# Patient Record
Sex: Male | Born: 1960 | Hispanic: No | Marital: Married | Smoking: Never smoker
Health system: Southern US, Community
[De-identification: ages and names within clinical notes are randomized; demographics above are authoritative.]

## PROBLEM LIST (undated history)

## (undated) DIAGNOSIS — D497 Neoplasm of unspecified behavior of endocrine glands and other parts of nervous system: Secondary | ICD-10-CM

## (undated) DIAGNOSIS — F32A Depression, unspecified: Secondary | ICD-10-CM

## (undated) DIAGNOSIS — R51 Headache: Secondary | ICD-10-CM

## (undated) DIAGNOSIS — E785 Hyperlipidemia, unspecified: Secondary | ICD-10-CM

## (undated) DIAGNOSIS — H539 Unspecified visual disturbance: Secondary | ICD-10-CM

## (undated) DIAGNOSIS — E119 Type 2 diabetes mellitus without complications: Secondary | ICD-10-CM

## (undated) DIAGNOSIS — F329 Major depressive disorder, single episode, unspecified: Secondary | ICD-10-CM

## (undated) DIAGNOSIS — H269 Unspecified cataract: Secondary | ICD-10-CM

## (undated) DIAGNOSIS — R413 Other amnesia: Secondary | ICD-10-CM

## (undated) DIAGNOSIS — M1990 Unspecified osteoarthritis, unspecified site: Secondary | ICD-10-CM

## (undated) DIAGNOSIS — R519 Headache, unspecified: Secondary | ICD-10-CM

## (undated) HISTORY — DX: Other amnesia: R41.3

## (undated) HISTORY — DX: Headache, unspecified: R51.9

## (undated) HISTORY — DX: Unspecified cataract: H26.9

## (undated) HISTORY — DX: Depression, unspecified: F32.A

## (undated) HISTORY — PX: OTHER SURGICAL HISTORY: SHX169

## (undated) HISTORY — DX: Type 2 diabetes mellitus without complications: E11.9

## (undated) HISTORY — PX: SHOULDER ARTHROSCOPY: SHX128

## (undated) HISTORY — DX: Headache: R51

## (undated) HISTORY — DX: Major depressive disorder, single episode, unspecified: F32.9

---

## 2013-08-18 ENCOUNTER — Encounter: Payer: Self-pay | Admitting: Internal Medicine

## 2013-08-18 ENCOUNTER — Ambulatory Visit: Payer: Medicaid Other | Attending: Internal Medicine | Admitting: Internal Medicine

## 2013-08-18 VITALS — BP 132/89 | HR 85 | Temp 98.3°F | Resp 16 | Ht 68.0 in | Wt 224.0 lb

## 2013-08-18 DIAGNOSIS — M545 Low back pain, unspecified: Secondary | ICD-10-CM | POA: Insufficient documentation

## 2013-08-18 DIAGNOSIS — M25569 Pain in unspecified knee: Secondary | ICD-10-CM

## 2013-08-18 DIAGNOSIS — M25561 Pain in right knee: Secondary | ICD-10-CM

## 2013-08-18 LAB — CBC WITH DIFFERENTIAL/PLATELET
Basophils Absolute: 0.1 10*3/uL (ref 0.0–0.1)
Basophils Relative: 1 % (ref 0–1)
Eosinophils Absolute: 0.3 10*3/uL (ref 0.0–0.7)
Hemoglobin: 13.7 g/dL (ref 13.0–17.0)
MCH: 26.7 pg (ref 26.0–34.0)
MCHC: 33.1 g/dL (ref 30.0–36.0)
Monocytes Relative: 5 % (ref 3–12)
Neutro Abs: 4.4 10*3/uL (ref 1.7–7.7)
Neutrophils Relative %: 58 % (ref 43–77)
Platelets: 290 10*3/uL (ref 150–400)
RDW: 13.2 % (ref 11.5–15.5)
WBC: 7.6 10*3/uL (ref 4.0–10.5)

## 2013-08-18 LAB — CMP AND LIVER
ALT: 13 U/L (ref 0–53)
Albumin: 4.6 g/dL (ref 3.5–5.2)
BUN: 15 mg/dL (ref 6–23)
Bilirubin, Direct: 0.1 mg/dL (ref 0.0–0.3)
CO2: 28 mEq/L (ref 19–32)
Calcium: 9.8 mg/dL (ref 8.4–10.5)
Glucose, Bld: 175 mg/dL — ABNORMAL HIGH (ref 70–99)
Indirect Bilirubin: 0.2 mg/dL (ref 0.0–0.9)
Potassium: 4.3 mEq/L (ref 3.5–5.3)
Sodium: 135 mEq/L (ref 135–145)
Total Bilirubin: 0.3 mg/dL (ref 0.3–1.2)
Total Protein: 8 g/dL (ref 6.0–8.3)

## 2013-08-18 LAB — LIPID PANEL
Cholesterol: 263 mg/dL — ABNORMAL HIGH (ref 0–200)
Triglycerides: 607 mg/dL — ABNORMAL HIGH (ref <150)

## 2013-08-18 MED ORDER — NAPROXEN 500 MG PO TABS: 500.0000 mg | ORAL_TABLET | Freq: Two times a day (BID) | ORAL | Status: DC

## 2013-08-18 MED ORDER — DICLOFENAC SODIUM 1 % TD GEL: 2.0000 g | Freq: Four times a day (QID) | TRANSDERMAL | Status: DC

## 2013-08-18 NOTE — Progress Notes (Signed)
Pt is here today to establish care. For a year and a half he has been dealing with extreme pain in his right knee and lower back. Pt has a Nurse, learning disability with him today.

## 2013-08-18 NOTE — Progress Notes (Signed)
Patient ID: Joe Wallace, male   DOB: 30-Jan-1961, 52 y.o.   MRN: [id number] Patient Demographics  Joe Wallace, is a 52 y.o. male  [id number]  [id number]  DOB - 11-29-1960  CC:  Chief Complaint  Patient presents with  . Establish Care       HPI: Joe Wallace is a 52 y.o. male here today to establish medical care. Patient has no known past medical history, recently relocated to the [place] about 40 days ago. Major complaint today is right knee pain and low back pain that has been going on for about a year. He does not remember any injury, no swelling, no tingling or numbness in any of the limbs. He does not remember any stiffness. No redness. Never been diagnosed with arthritis. He does not smoke cigarette, he does not drink alcohol. He's not on any medication. Right knee pain is worse on walking or standing from a sitting position. Patient has No headache, No chest pain, No abdominal pain - No Nausea, No new weakness tingling or numbness, No Cough - SOB.  No Known Allergies History reviewed. No pertinent past medical history. No current outpatient prescriptions on file prior to visit.   No current facility-administered medications on file prior to visit.   Family History  Problem Relation Age of Onset  . Diabetes Mother   . Hypertension Mother   . Hypertension Father    History   Social History  . Marital Status: Married    Spouse Name: N/A    Number of Children: N/A  . Years of Education: N/A   Occupational History  . Not on file.   Social History Main Topics  . Smoking status: Never Smoker   . Smokeless tobacco: Not on file  . Alcohol Use: No  . Drug Use: No  . Sexual Activity: Not on file   Other Topics Concern  . Not on file   Social History Narrative  . No narrative on file    Review of Systems: Constitutional: Negative for fever, chills, diaphoresis, activity change, appetite change and fatigue. HENT: Negative for ear pain, nosebleeds,  congestion, facial swelling, rhinorrhea, neck pain, neck stiffness and ear discharge.  Eyes: Negative for pain, discharge, redness, itching and visual disturbance. Respiratory: Negative for cough, choking, chest tightness, shortness of breath, wheezing and stridor.  Cardiovascular: Negative for chest pain, palpitations and leg swelling. Gastrointestinal: Negative for abdominal distention. Genitourinary: Negative for dysuria, urgency, frequency, hematuria, flank pain, decreased urine volume, difficulty urinating and dyspareunia.  Musculoskeletal: Negative for back pain, joint swelling, arthralgia and gait problem. Neurological: Negative for dizziness, tremors, seizures, syncope, facial asymmetry, speech difficulty, weakness, light-headedness, numbness and headaches.  Hematological: Negative for adenopathy. Does not bruise/bleed easily. Psychiatric/Behavioral: Negative for hallucinations, behavioral problems, confusion, dysphoric mood, decreased concentration and agitation.    Objective:   Filed Vitals:   08/18/13 1406  BP: 132/89  Pulse: 85  Temp: 98.3 F (36.8 C)  Resp: 16    Physical Exam: Constitutional: Patient appears well-developed and well-nourished. No distress. Obese HENT: Normocephalic, atraumatic, External right and left ear normal. Oropharynx is clear and moist.  Eyes: Conjunctivae and EOM are normal. PERRLA, no scleral icterus. Neck: Normal ROM. Neck supple. No JVD. No tracheal deviation. No thyromegaly. CVS: RRR, S1/S2 +, no murmurs, no gallops, no carotid bruit.  Pulmonary: Effort and breath sounds normal, no stridor, rhonchi, wheezes, rales.  Abdominal: Soft. BS +, no distension, tenderness, rebound or guarding.  Musculoskeletal: Normal range of motion of left  knee, restricted flexion and extension of the right knee due to pain. No edema and no tenderness.  Lymphadenopathy: No lymphadenopathy noted, cervical, inguinal or axillary Neuro: Alert. Normal reflexes, muscle  tone coordination. No cranial nerve deficit. Skin: Skin is warm and dry. No rash noted. Not diaphoretic. No erythema. No pallor. Psychiatric: Normal mood and affect. Behavior, judgment, thought content normal.  No results found for this basename: WBC, HGB, HCT, MCV, PLT   No results found for this basename: CREATININE, BUN, NA, K, CL, CO2    No results found for this basename: HGBA1C   Lipid Panel  No results found for this basename: chol, trig, hdl, cholhdl, vldl, ldlcalc       Assessment and plan:   1. Right knee pain - naproxen (NAPROSYN) 500 MG tablet; Take 1 tablet (500 mg total) by mouth 2 (two) times daily with a meal.  Dispense: 30 tablet; Refill: 1 - diclofenac sodium (VOLTAREN) 1 % GEL; Apply 2 g topically 4 (four) times daily.  Dispense: 2 Tube; Refill: 1 - DG Knee 2 Views Right; Future  2. Low back pain - naproxen (NAPROSYN) 500 MG tablet; Take 1 tablet (500 mg total) by mouth 2 (two) times daily with a meal.  Dispense: 30 tablet; Refill: 1  3. Morbid obesity - CMP and Liver - CBC with Differential - Lipid panel - TSH  Follow up in 3 months or when necessary  Interpreter was used to communicate directly with patient for the entire encounter including providing detailed patient instructions.   The patient was given clear instructions to go to ER or return to medical center if symptoms don't improve, worsen or new problems develop. The patient verbalized understanding. The patient was told to call to get lab results if they haven't heard anything in the next week.     Jeanann Lewandowsky, MD, MHA, FACP, FAAP [facility] [place], [place] [phone]   08/18/2013, 2:27 PM

## 2013-08-28 ENCOUNTER — Telehealth: Payer: Self-pay | Admitting: Emergency Medicine

## 2013-08-28 MED ORDER — GEMFIBROZIL 600 MG PO TABS: 600.0000 mg | ORAL_TABLET | Freq: Two times a day (BID) | ORAL | Status: DC

## 2013-08-28 NOTE — Telephone Encounter (Signed)
Message copied by Darlis Loan on Thu Aug 28, 2013  2:04 PM ------      Message from: Jeanann Lewandowsky E      Created: Wed Aug 27, 2013  5:35 PM       Please inform patient that his blood sugar is high as well as his cholesterol triglyceride level. We'll need to get a hemoglobin A1c laboratory tests. We also needs to start him on medication for triglyceride. Recommend strict low cholesterol low fat diet, and regular exercise 3 times a week 30 minutes each time.       Please call in the following medications:            Gemfibrozil 600 mg tablet by mouth twice a day, 60 tablets with 5 refills ------

## 2013-08-28 NOTE — Telephone Encounter (Signed)
Left message for pt to call for results  

## 2013-09-01 ENCOUNTER — Ambulatory Visit (HOSPITAL_COMMUNITY)
Admission: RE | Admit: 2013-09-01 | Discharge: 2013-09-01 | Disposition: A | Discharge status: Home / Self Care | Payer: Medicaid Other | Source: Ambulatory Visit | Attending: Internal Medicine | Admitting: Internal Medicine

## 2013-09-01 DIAGNOSIS — M1710 Unilateral primary osteoarthritis, unspecified knee: Secondary | ICD-10-CM | POA: Insufficient documentation

## 2013-09-01 DIAGNOSIS — IMO0002 Reserved for concepts with insufficient information to code with codable children: Secondary | ICD-10-CM | POA: Insufficient documentation

## 2013-09-01 DIAGNOSIS — M25561 Pain in right knee: Secondary | ICD-10-CM

## 2013-09-02 ENCOUNTER — Ambulatory Visit: Payer: Medicaid Other | Admitting: Internal Medicine

## 2013-09-05 ENCOUNTER — Telehealth: Payer: Self-pay | Admitting: *Deleted

## 2013-09-05 NOTE — Telephone Encounter (Signed)
Message copied by Raynelle Chary on Fri Sep 05, 2013  2:06 PM ------      Message from: Jeanann Lewandowsky E      Created: Thu Sep 04, 2013  3:54 PM       Please inform patient that his knee x-ray is negative for any disease ------

## 2013-09-05 NOTE — Telephone Encounter (Signed)
Message copied by Raynelle Chary on Fri Sep 05, 2013  2:08 PM ------      Message from: Jeanann Lewandowsky E      Created: Thu Sep 04, 2013  3:54 PM       Please inform patient that his knee x-ray is negative for any disease ------

## 2013-09-15 ENCOUNTER — Encounter: Payer: Self-pay | Admitting: Internal Medicine

## 2013-09-15 ENCOUNTER — Ambulatory Visit: Payer: Medicaid Other | Attending: Internal Medicine | Admitting: Internal Medicine

## 2013-09-15 DIAGNOSIS — M25561 Pain in right knee: Secondary | ICD-10-CM

## 2013-09-15 DIAGNOSIS — M25569 Pain in unspecified knee: Secondary | ICD-10-CM

## 2013-09-15 MED ORDER — IBUPROFEN 600 MG PO TABS: 600.0000 mg | ORAL_TABLET | Freq: Three times a day (TID) | ORAL | Status: DC | PRN

## 2013-09-15 NOTE — Progress Notes (Signed)
MRN: [id number] Name: Joe Wallace  Sex: male Age: 52 y.o. DOB: 18-Jan-1961  Allergies: Review of patient's allergies indicates no known allergies.  Chief Complaint  Patient presents with  . Follow-up    HPI: Patient is 52 y.o. male who comes today for followup history of chronic right knee pain for the last 3 years patient was prescribed naproxen and voltaren gel patient still has pain, had an x-ray done which was negative for any dislocation or fracture.patient denies any fever chills.  History reviewed. No pertinent past medical history.  History reviewed. No pertinent past surgical history.    Medication List       This list is accurate as of: 09/15/13  4:21 PM.  Always use your most recent med list.               diclofenac sodium 1 % Gel  Commonly known as:  VOLTAREN  Apply 2 g topically 4 (four) times daily.     gemfibrozil 600 MG tablet  Commonly known as:  LOPID  Take 1 tablet (600 mg total) by mouth 2 (two) times daily before a meal.     ibuprofen 600 MG tablet  Commonly known as:  ADVIL,MOTRIN  Take 1 tablet (600 mg total) by mouth every 8 (eight) hours as needed.        Meds ordered this encounter  Medications  . ibuprofen (ADVIL,MOTRIN) 600 MG tablet    Sig: Take 1 tablet (600 mg total) by mouth every 8 (eight) hours as needed.    Dispense:  90 tablet    Refill:  1     There is no immunization history on file for this patient.  Family History  Problem Relation Age of Onset  . Diabetes Mother   . Hypertension Mother   . Hypertension Father     History  Substance Use Topics  . Smoking status: Never Smoker   . Smokeless tobacco: Not on file  . Alcohol Use: No    Review of Systems  As noted in HPI  There were no vitals filed for this visit.  Physical Exam  Physical Exam  Eyes: EOM are normal. Pupils are equal, round, and reactive to light.  Cardiovascular: Normal rate and regular rhythm.   Pulmonary/Chest: Breath sounds  normal. No respiratory distress. He has no wheezes. He has no rales.  Musculoskeletal: He exhibits no edema.  Right knee tenderness posteriorly no erythema, good ROM    CBC    Component Value Date/Time   WBC 7.6 08/18/2013 1427   RBC 5.14 08/18/2013 1427   HGB 13.7 08/18/2013 1427   HCT 41.4 08/18/2013 1427   PLT 290 08/18/2013 1427   MCV 80.5 08/18/2013 1427   LYMPHSABS 2.4 08/18/2013 1427   MONOABS 0.4 08/18/2013 1427   EOSABS 0.3 08/18/2013 1427   BASOSABS 0.1 08/18/2013 1427    CMP     Component Value Date/Time   NA 135 08/18/2013 1427   K 4.3 08/18/2013 1427   CL 97 08/18/2013 1427   CO2 28 08/18/2013 1427   GLUCOSE 175* 08/18/2013 1427   BUN 15 08/18/2013 1427   CREATININE 1.01 08/18/2013 1427   CALCIUM 9.8 08/18/2013 1427   PROT 8.0 08/18/2013 1427   ALBUMIN 4.6 08/18/2013 1427   AST 15 08/18/2013 1427   ALT 13 08/18/2013 1427   ALKPHOS 74 08/18/2013 1427   BILITOT 0.3 08/18/2013 1427    Lab Results  Component Value Date/Time   CHOL 263* 08/18/2013  2:27  PM    No components found with this basename: hga1c    Lab Results  Component Value Date/Time   AST 15 08/18/2013  2:27 PM    Assessment and Plan  Knee pain, right - Plan: I have discontinued naproxen we'll try ibuprofen (ADVIL,MOTRIN) 600 MG tablet, Ambulatory referral to Orthopedic Surgery   Return in about 3 months (around 12/14/2013).  Doris Cheadle, MD

## 2013-09-15 NOTE — Progress Notes (Signed)
Patient here for follow up Pain to right knee and back

## 2013-10-08 ENCOUNTER — Ambulatory Visit: Payer: Medicaid Other | Admitting: Sports Medicine

## 2013-11-18 ENCOUNTER — Encounter: Payer: Self-pay | Admitting: Internal Medicine

## 2013-11-18 ENCOUNTER — Ambulatory Visit: Payer: Medicaid Other | Attending: Internal Medicine | Admitting: Internal Medicine

## 2013-11-18 VITALS — BP 123/78 | HR 79

## 2013-11-18 DIAGNOSIS — R262 Difficulty in walking, not elsewhere classified: Secondary | ICD-10-CM | POA: Insufficient documentation

## 2013-11-18 DIAGNOSIS — M25561 Pain in right knee: Secondary | ICD-10-CM | POA: Insufficient documentation

## 2013-11-18 DIAGNOSIS — G8929 Other chronic pain: Secondary | ICD-10-CM | POA: Insufficient documentation

## 2013-11-18 DIAGNOSIS — Z09 Encounter for follow-up examination after completed treatment for conditions other than malignant neoplasm: Secondary | ICD-10-CM | POA: Insufficient documentation

## 2013-11-18 DIAGNOSIS — G4733 Obstructive sleep apnea (adult) (pediatric): Secondary | ICD-10-CM | POA: Insufficient documentation

## 2013-11-18 DIAGNOSIS — M25569 Pain in unspecified knee: Secondary | ICD-10-CM | POA: Insufficient documentation

## 2013-11-18 MED ORDER — ACETAMINOPHEN-CODEINE #3 300-30 MG PO TABS: 1.0000 | ORAL_TABLET | ORAL | Status: DC | PRN

## 2013-11-18 NOTE — Progress Notes (Signed)
Patient ID: Joe Wallace, male   DOB: 1961-02-25, 53 y.o.   MRN: [id number]   Tim Corriher, is a 53 y.o. male  [id number]  KGM:[id number]  DOB - 20-Feb-1961  No chief complaint on file.       Subjective:   Joe Wallace is a 53 y.o. male here today for a follow up visit. Patient is here to complain about right knee pain which is chronic, patient reports pain is getting worse and limiting his ability to walk. There is no swelling. Patient cannot remember any injury or fall. Patient has used naproxen with no relief, as well as ibuprofen and tramadol, which weren't particularly useful. No leg edema. Denies chest pain. Patient has No headache, No chest pain, No abdominal pain - No Nausea, No new weakness tingling or numbness, No Cough - SOB.  Problem  Knee Pain, Right    ALLERGIES: No Known Allergies  PAST MEDICAL HISTORY: No past medical history on file.  MEDICATIONS AT HOME: Prior to Admission medications   Medication Sig Start Date End Date Taking? Authorizing Provider  acetaminophen-codeine (TYLENOL #3) 300-30 MG per tablet Take 1 tablet by mouth every 4 (four) hours as needed. 11/18/13   Angelica Chessman, MD  diclofenac sodium (VOLTAREN) 1 % GEL Apply 2 g topically 4 (four) times daily. 08/18/13   Angelica Chessman, MD  gemfibrozil (LOPID) 600 MG tablet Take 1 tablet (600 mg total) by mouth 2 (two) times daily before a meal. 08/28/13   Angelica Chessman, MD  ibuprofen (ADVIL,MOTRIN) 600 MG tablet Take 1 tablet (600 mg total) by mouth every 8 (eight) hours as needed. 09/15/13   Lorayne Marek, MD     Objective:   Filed Vitals:   11/18/13 1530  BP: 123/78  Pulse: 79    Exam General appearance : Awake, alert, not in any distress. Speech Clear. Not toxic looking HEENT: Atraumatic and Normocephalic, pupils equally reactive to light and accomodation Neck: supple, no JVD. No cervical lymphadenopathy.  Chest:Good air entry bilaterally, no added sounds  CVS: S1 S2  regular, no murmurs.  Abdomen: Bowel sounds present, Non tender and not distended with no gaurding, rigidity or rebound. Extremities: No effusion the knee, range of motion limited because of pain. B/L Lower Ext shows no edema, both legs are warm to touch Neurology: Awake alert, and oriented X 3, CN II-XII intact, Non focal Skin:No Rash Wounds:N/A  Data Review  Assessment & Plan   1. Knee pain, right  - MR Knee Right Wo Contrast; Future - acetaminophen-codeine (TYLENOL #3) 300-30 MG per tablet; Take 1 tablet by mouth every 4 (four) hours as needed.  Dispense: 60 tablet; Refill: 0 - Ambulatory referral to Orthopedic Surgery  2. Obstructive sleep apnea Nocturnal polysomnography  Patient extensively counseled on nutrition and exercise  Interpreter was used to communicate directly with patient for the entire encounter including providing detailed patient instructions.   Return in about 3 months (around 02/15/2014), or if symptoms worsen or fail to improve, for Annual Physical.  The patient was given clear instructions to go to ER or return to medical center if symptoms don't improve, worsen or new problems develop. The patient verbalized understanding. The patient was told to call to get lab results if they haven't heard anything in the next week.    Angelica Chessman, MD, [place], [place], [place] and [place], [place]   11/18/2013, 3:36 PM

## 2013-12-03 ENCOUNTER — Ambulatory Visit: Payer: Medicaid Other | Admitting: Sports Medicine

## 2013-12-04 ENCOUNTER — Ambulatory Visit (HOSPITAL_COMMUNITY)
Admission: RE | Admit: 2013-12-04 | Discharge: 2013-12-04 | Disposition: A | Discharge status: Home / Self Care | Payer: Medicaid Other | Source: Ambulatory Visit | Attending: Internal Medicine | Admitting: Internal Medicine

## 2013-12-04 ENCOUNTER — Other Ambulatory Visit: Payer: Self-pay | Admitting: Emergency Medicine

## 2013-12-04 ENCOUNTER — Telehealth: Payer: Self-pay | Admitting: Internal Medicine

## 2013-12-04 DIAGNOSIS — M25561 Pain in right knee: Secondary | ICD-10-CM

## 2013-12-04 DIAGNOSIS — S0095XA Superficial foreign body of unspecified part of head, initial encounter: Secondary | ICD-10-CM

## 2013-12-04 NOTE — Telephone Encounter (Signed)
Radiology stated they could not do the MRI without a CT scan first because the patient has a piece of metal in his head. Please call Tasha in radiology for details of what order is needed/questions. Her number is [phone]

## 2013-12-04 NOTE — Telephone Encounter (Signed)
Spoke with pt via pacific interpretor line and informed he needs to get Ct scan tomorrow r/o foreign object @ 945 am. We will call him tomorrow to schedule MRI

## 2013-12-05 ENCOUNTER — Encounter: Payer: Self-pay | Admitting: *Deleted

## 2013-12-05 ENCOUNTER — Ambulatory Visit (HOSPITAL_COMMUNITY)
Admission: RE | Admit: 2013-12-05 | Discharge: 2013-12-05 | Disposition: A | Discharge status: Home / Self Care | Payer: Medicaid Other | Source: Ambulatory Visit | Attending: Internal Medicine | Admitting: Internal Medicine

## 2013-12-05 DIAGNOSIS — Z0000 Encounter for general adult medical examination without abnormal findings: Secondary | ICD-10-CM

## 2013-12-05 DIAGNOSIS — S0095XA Superficial foreign body of unspecified part of head, initial encounter: Secondary | ICD-10-CM

## 2013-12-05 DIAGNOSIS — Z135 Encounter for screening for eye and ear disorders: Secondary | ICD-10-CM | POA: Insufficient documentation

## 2013-12-15 ENCOUNTER — Ambulatory Visit: Payer: Medicaid Other | Admitting: Internal Medicine

## 2013-12-17 ENCOUNTER — Ambulatory Visit (INDEPENDENT_AMBULATORY_CARE_PROVIDER_SITE_OTHER): Payer: Medicaid Other | Admitting: Sports Medicine

## 2013-12-17 ENCOUNTER — Encounter: Payer: Self-pay | Admitting: Sports Medicine

## 2013-12-17 VITALS — Ht 68.0 in | Wt 224.0 lb

## 2013-12-17 DIAGNOSIS — M25561 Pain in right knee: Secondary | ICD-10-CM

## 2013-12-17 DIAGNOSIS — M25569 Pain in unspecified knee: Secondary | ICD-10-CM

## 2013-12-17 MED ORDER — METHYLPREDNISOLONE ACETATE 40 MG/ML IJ SUSP
40.0000 mg | Freq: Once | INTRAMUSCULAR | Status: AC
  Administered 2013-12-17: 40 mg via INTRA_ARTICULAR

## 2013-12-17 NOTE — Progress Notes (Addendum)
   Subjective:    Patient ID: Joe Wallace, male    DOB: Jan 28, 1961, 53 y.o.   MRN: [id number]  HPI  History and exam performed with Interpreter present.  Joe Wallace is from [place] and presents with a 2 year history of right knee pain. He cannot recall a specific injury to the knee but states the pain is getting worse. While in [place] he was told he had a meniscal injury but has never been operated on. Previously he was given an injection in his buttock to help with the knee pain but is unsure what medicine he received and states it did not improve his symptoms.   Pain is in the popliteal fossa and radiates to the medial joint line. It is worse with walking and going upstairs and twisting motion. He has been taking Ibuprofen 600 MG and Tylenol #3 without help. He states that voltaren gel improves his symptoms. Denies swelling, locking, buckling, or nighttime pain.   Review of Systems Negative apart from HPI     Objective:   Physical Exam   Right Knee Inspection: no effusion or erythema.  Palpation: tender along right medial joint line. Also tender in popliteal fossa ROM: Pain with flexion. Flexion to 110 degrees, Extension to 0 degrees.   Special Test: Crepitus with extension, pain with McMurray's testing. Negative varus, valgus, and anterior drawer. Neurovascularly intact distally.   X-ray Right knee 07/2013 (non-standing): There is no evidence of fracture, dislocation, or joint effusion. There is no evidence of arthropathy or other focal bone abnormality. Soft tissues are unremarkable.     Assessment & Plan:    1. Right knee pain: Suspect meniscal injury given pain with twisting motion and history of previous diagnosis per patient.  --Right medial injection of 40 MG Methylprednisolone --Pt can contact PCP for refill of voltaren gel as needed --Follow-up in 4 weeks. If pain is not improved will likely get an MRI at that time to assess need for surgery.   Consent obtained and  verified. Time-out conducted. Noted no overlying erythema, induration, or other signs of local infection. Skin prepped in a sterile fashion. Topical analgesic spray: Ethyl chloride. Joint: right knee Needle: 25g 1.5 inch Completed without difficulty. Meds: 3cc 1% xylocaine, 1cc (40mg ) depomedrol  Advised to call if fevers/chills, erythema, induration, drainage, or persistent bleeding.   Seen with Jacqulyn Liner, MS4

## 2013-12-18 ENCOUNTER — Telehealth: Payer: Self-pay | Admitting: Internal Medicine

## 2013-12-18 NOTE — Telephone Encounter (Signed)
Congregational nurse calling because pt's blood sugar reading of 325, and pt had eaten 4 hours prior.   Pt experience abd pain, probably due to Motrin prescribed for dental pain.

## 2013-12-19 ENCOUNTER — Other Ambulatory Visit: Payer: Medicaid Other

## 2013-12-23 ENCOUNTER — Ambulatory Visit: Payer: Medicaid Other | Attending: Internal Medicine

## 2013-12-23 MED ORDER — METFORMIN HCL 500 MG PO TABS: 500.0000 mg | ORAL_TABLET | Freq: Two times a day (BID) | ORAL | Status: DC

## 2013-12-23 NOTE — Progress Notes (Unsigned)
Pt sent here by congregational nurse for elevated CBG CBG- 165,A1c 9.3 Pt scheduled apt for new diabetes teaching with Loma Sousa 12/30/13 and referral placed nutrition  Pt started on Metformin 500 mg BID [place] interpretor used for arabic language Script sent to [facility]

## 2013-12-23 NOTE — Patient Instructions (Addendum)
Pt instructed to return to the clinic 12/30/13 for new diabetes teaching and cbg monitoring

## 2013-12-29 ENCOUNTER — Ambulatory Visit (HOSPITAL_BASED_OUTPATIENT_CLINIC_OR_DEPARTMENT_OTHER): Payer: Medicaid Other | Attending: Internal Medicine | Admitting: Radiology

## 2013-12-29 VITALS — Ht 68.0 in | Wt 210.0 lb

## 2013-12-29 DIAGNOSIS — G4733 Obstructive sleep apnea (adult) (pediatric): Secondary | ICD-10-CM | POA: Insufficient documentation

## 2013-12-31 ENCOUNTER — Ambulatory Visit: Payer: Medicaid Other | Attending: Internal Medicine | Admitting: Pharmacist

## 2013-12-31 ENCOUNTER — Encounter: Payer: Self-pay | Admitting: Pharmacist

## 2013-12-31 VITALS — BP 110/66 | HR 74 | Temp 98.4°F | Ht 68.5 in | Wt 221.0 lb

## 2013-12-31 DIAGNOSIS — Z0000 Encounter for general adult medical examination without abnormal findings: Secondary | ICD-10-CM

## 2013-12-31 DIAGNOSIS — Z09 Encounter for follow-up examination after completed treatment for conditions other than malignant neoplasm: Secondary | ICD-10-CM | POA: Insufficient documentation

## 2013-12-31 DIAGNOSIS — E119 Type 2 diabetes mellitus without complications: Secondary | ICD-10-CM | POA: Insufficient documentation

## 2013-12-31 LAB — COMPREHENSIVE METABOLIC PANEL
ALBUMIN: 4.2 g/dL (ref 3.5–5.2)
ALK PHOS: 70 U/L (ref 39–117)
ALT: 16 U/L (ref 0–53)
AST: 14 U/L (ref 0–37)
BILIRUBIN TOTAL: 0.4 mg/dL (ref 0.2–1.2)
BUN: 18 mg/dL (ref 6–23)
CO2: 29 mEq/L (ref 19–32)
Calcium: 9.5 mg/dL (ref 8.4–10.5)
Chloride: 100 mEq/L (ref 96–112)
Creat: 0.92 mg/dL (ref 0.50–1.35)
Glucose, Bld: 126 mg/dL — ABNORMAL HIGH (ref 70–99)
POTASSIUM: 4.3 meq/L (ref 3.5–5.3)
SODIUM: 138 meq/L (ref 135–145)
TOTAL PROTEIN: 7.2 g/dL (ref 6.0–8.3)

## 2013-12-31 LAB — CBC WITH DIFFERENTIAL/PLATELET
Basophils Absolute: 0 10*3/uL (ref 0.0–0.1)
Basophils Relative: 0 % (ref 0–1)
EOS PCT: 2 % (ref 0–5)
Eosinophils Absolute: 0.2 10*3/uL (ref 0.0–0.7)
HEMATOCRIT: 39.1 % (ref 39.0–52.0)
Hemoglobin: 13 g/dL (ref 13.0–17.0)
LYMPHS ABS: 2.2 10*3/uL (ref 0.7–4.0)
LYMPHS PCT: 29 % (ref 12–46)
MCH: 26.3 pg (ref 26.0–34.0)
MCHC: 33.2 g/dL (ref 30.0–36.0)
MCV: 79 fL (ref 78.0–100.0)
MONO ABS: 0.5 10*3/uL (ref 0.1–1.0)
Monocytes Relative: 6 % (ref 3–12)
Neutro Abs: 4.8 10*3/uL (ref 1.7–7.7)
Neutrophils Relative %: 63 % (ref 43–77)
Platelets: 251 10*3/uL (ref 150–400)
RBC: 4.95 MIL/uL (ref 4.22–5.81)
RDW: 13.2 % (ref 11.5–15.5)
WBC: 7.6 10*3/uL (ref 4.0–10.5)

## 2013-12-31 LAB — POCT GLYCOSYLATED HEMOGLOBIN (HGB A1C): HEMOGLOBIN A1C: 9.8

## 2013-12-31 LAB — LIPID PANEL
CHOL/HDL RATIO: 6 ratio
Cholesterol: 198 mg/dL (ref 0–200)
HDL: 33 mg/dL — AB (ref >39)
LDL CALC: 102 mg/dL — AB (ref 0–99)
Triglycerides: 313 mg/dL — ABNORMAL HIGH (ref <150)
VLDL: 63 mg/dL — AB (ref 0–40)

## 2013-12-31 LAB — GLUCOSE, POCT (MANUAL RESULT ENTRY): POC GLUCOSE: 158 mg/dL — AB (ref 70–99)

## 2013-12-31 LAB — TSH: TSH: 1.49 u[IU]/mL (ref 0.350–4.500)

## 2013-12-31 MED ORDER — GLUCOSE BLOOD VI STRP: ORAL_STRIP | Status: DC

## 2013-12-31 MED ORDER — METFORMIN HCL 1000 MG PO TABS: 1000.0000 mg | ORAL_TABLET | Freq: Two times a day (BID) | ORAL | Status: DC

## 2013-12-31 MED ORDER — ACCU-CHEK SOFTCLIX LANCET DEV MISC: Status: DC

## 2013-12-31 MED ORDER — ACCU-CHEK AVIVA DEVI: Status: AC

## 2013-12-31 NOTE — Progress Notes (Signed)
Diabetes Follow-Up Visit Chief Complaint: ....Marland KitchenMarland KitchenFenix Wallace is a 53 y.o.male presenting with newly diagnosed Type II diabetes. Chief Complaint  Patient presents with  . Diabetes   on 12/31/2013  Filed Vitals:   12/31/13 1217  BP: 110/66  Pulse: 74  Temp: 98.4 F (36.9 C)    Current Diabetes Medications: Metformin 500 mg BID   Home BG Monitoring:  Has not been checking at home since pt does not have meter.   Lab Results  Component Value Date   HGBA1C 9.8 12/31/2013    No results found for this basename: MICROALBUR, [id number]    Lab Results  Component Value Date   CHOL 263* 08/18/2013   HDL 39* 08/18/2013   LDLCALC Comment:   Not calculated due to Triglyceride >400. Suggest ordering Direct LDL (Unit Code: [phone]).   Total Cholesterol/HDL Ratio:CHD Risk                        Coronary Heart Disease Risk Table                                        Men       Women          1/2 Average Risk              3.4        3.3              Average Risk              5.0        4.4           2X Average Risk              9.6        7.1           3X Average Risk             23.4       11.0 Use the calculated Patient Ratio above and the CHD Risk table  to determine the patient's CHD Risk. ATP III Classification (LDL):       < 100        mg/dL         Optimal      100 - 129     mg/dL         Near or Above Optimal      130 - 159     mg/dL         Borderline High      160 - 189     mg/dL         High       > 190        mg/dL         Very High   08/18/2013   TRIG 607* 08/18/2013   CHOLHDL 6.7 08/18/2013      Assessment: 1.  Diabetes.  A1c not at goal 2.  Blood Pressure.  At goal (<140/90) 3.  Lipids.  Labs today 4.  Foot Care.  Checking foot daily 5.  Dental Care.  Needs dental exam 6.  Eye Care/Exam.  Needs eye exam Recommendations: 1.  Medication recommendations at this time are as follows: Increase Metformin to 1000 mg BID 2.  Reviewed HBG goals:  Fasting 80-130 and 1-2 hour post prandial  <  180.  Patient is instructed to check BG 2 times per day.   Sent rx in for meter and supplies to [facility]. 3.  BP goal < 140/90. May consider low dose ACE-I for kidney protection. 4.  LDL goal of < 100, HDL > 40 and TG < 150. 5.  Eye Exam yearly and Dental Exam every 6 months. 6.  Patient education on appropriate foot care.   7.  Return to clinic in 2 weeks.  Will review blood sugar log and adjust.      Evaluation and management procedures were performed by the Advanced Practitioner under my supervision and collaboration. I have reviewed the Advanced Practitioner's note and chart, and I agree with the management and plan.   Angelica Chessman, MD, [place], [facility]arilyn C[facility] and [facility] [place], [place]   12/31/2013, 3:49 PM

## 2014-01-01 ENCOUNTER — Telehealth: Payer: Self-pay

## 2014-01-01 ENCOUNTER — Other Ambulatory Visit: Payer: Self-pay | Admitting: Pharmacist

## 2014-01-01 DIAGNOSIS — E785 Hyperlipidemia, unspecified: Secondary | ICD-10-CM

## 2014-01-01 DIAGNOSIS — E782 Mixed hyperlipidemia: Secondary | ICD-10-CM

## 2014-01-01 MED ORDER — ATORVASTATIN CALCIUM 20 MG PO TABS
20.0000 mg | ORAL_TABLET | Freq: Every day | ORAL | Status: DC
Start: 2014-01-01 — End: 2014-12-31

## 2014-01-01 NOTE — Telephone Encounter (Signed)
Interpreter line used Patient is aware he should be taking lipitor for his cholesterol

## 2014-01-01 NOTE — Telephone Encounter (Signed)
Message copied by Dorothe Pea on Thu Jan 01, 2014  2:56 PM ------      Message from: Kerby Moors B      Created: Thu Jan 01, 2014  9:40 AM       Pt should be started on Lipitor 20 mg for cholesterol.  Will send rx to [facility].  Please provide education about cholesterol. ------

## 2014-01-03 DIAGNOSIS — G4710 Hypersomnia, unspecified: Secondary | ICD-10-CM

## 2014-01-03 DIAGNOSIS — G4730 Sleep apnea, unspecified: Secondary | ICD-10-CM

## 2014-01-03 DIAGNOSIS — G4733 Obstructive sleep apnea (adult) (pediatric): Secondary | ICD-10-CM

## 2014-01-03 NOTE — Sleep Study (Signed)
   NAME: Enis Riecke DATE OF BIRTH:  Oct 17, 1960 MEDICAL RECORD NUMBER [id number]  LOCATION: [facility]  PHYSICIAN: Clinton D Young  DATE OF STUDY: 12/29/2013  SLEEP STUDY TYPE: Nocturnal Polysomnogram               REFERRING PHYSICIAN: Angelica Chessman, MD  INDICATION FOR STUDY: Hypersomnia with sleep apnea  EPWORTH SLEEPINESS SCORE:   8/24 HEIGHT: 5\' 8"  (172.7 cm)  WEIGHT: 210 lb (95.255 kg)    Body mass index is 31.94 kg/(m^2).  NECK SIZE: 16 in.  MEDICATIONS: Charted for review  SLEEP ARCHITECTURE: Total sleep time 338 minutes with sleep efficiency 86.6%. Stage I was 5.8%, stage II 71.3%, stage III 3.4%, REM 19.5% of total sleep time. Sleep latency 9 minutes, REM latency 45 minutes, awake after sleep onset 43.5 minutes, arousal index 5. Bedtime medication: None  RESPIRATORY DATA: Apnea hypopnea index (AHI) 4.1 per hour. 23 total events scored including 3 obstructive apneas, 1 central apnea, 1 mixed apnea, 18 hypopneas. Events were all supine. REM AHI 5.5 per hour. There were not enough events for split protocol CPAP titration.  OXYGEN DATA: Moderately loud snoring with oxygen desaturation to a nadir of 83% and mean oxygen saturation through the study of 95% on room air.  CARDIAC DATA: Normal sinus rhythm  MOVEMENT/PARASOMNIA: A few incidental limb jerks were noted, with little effect on sleep. Bathroom x1.  IMPRESSION/ RECOMMENDATION:   1) Occasional respiratory events with sleep disturbance, within normal limits. AHI 4.1 per hour (the normal range for adults is an AHI from 0-5 events per hour). Moderately loud snoring with oxygen desaturation to a nadir of 83% and mean oxygen saturation through the study of 95% on room air.  2) Conservative measures such as weight loss and encouragement to sleep off flat of back may be helpful.  Signed Baird Lyons M.D. [place], Tax adviser of Sleep Medicine  ELECTRONICALLY SIGNED ON:   01/03/2014, 3:03 PM [place] PH: [phone]   FX: [phone] [place]

## 2014-01-14 ENCOUNTER — Ambulatory Visit (INDEPENDENT_AMBULATORY_CARE_PROVIDER_SITE_OTHER): Payer: Medicaid Other | Admitting: Sports Medicine

## 2014-01-14 ENCOUNTER — Encounter: Payer: Self-pay | Admitting: Sports Medicine

## 2014-01-14 ENCOUNTER — Ambulatory Visit: Payer: Medicaid Other | Admitting: Pharmacist

## 2014-01-14 VITALS — BP 120/79 | HR 69 | Ht 68.5 in | Wt 221.0 lb

## 2014-01-14 DIAGNOSIS — M25569 Pain in unspecified knee: Secondary | ICD-10-CM

## 2014-01-14 DIAGNOSIS — M25561 Pain in right knee: Secondary | ICD-10-CM

## 2014-01-14 NOTE — Progress Notes (Signed)
   Subjective:    Patient ID: Joe Wallace, male    DOB: Dec 26, 1960, 53 y.o.   MRN: [id number]  HPI  Patient comes in today for followup on right knee pain. Pain is much better after a recent cortisone injection. However, he is requesting a second injection. He is still getting some mild discomfort in the posterior knee and feels like a second injection may eliminate all of his discomfort. No swelling. No mechanical symptoms. History is obtained with the help of an interpreter.    Review of Systems     Objective:   Physical Exam Well-developed, well-nourished. No acute distress   Right knee: Full range of motion. Slight tenderness along the medial joint line but negative McMurray's. No effusion. Neurovascularly intact distally. Walking without a limp.       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 I have agreed to reinject his knee in 4 weeks time if needed. He will return to the office in 4 weeks to discuss this further. In the meantime, given his improvement, I think we can hold on further diagnostic imaging.

## 2014-01-22 ENCOUNTER — Ambulatory Visit: Payer: Medicaid Other | Attending: Internal Medicine | Admitting: Pharmacist

## 2014-01-22 ENCOUNTER — Encounter: Payer: Self-pay | Admitting: Pharmacist

## 2014-01-22 VITALS — BP 130/72 | HR 73 | Wt 223.8 lb

## 2014-01-22 DIAGNOSIS — E119 Type 2 diabetes mellitus without complications: Secondary | ICD-10-CM

## 2014-01-22 LAB — GLUCOSE, POCT (MANUAL RESULT ENTRY): POC Glucose: 224 mg/dl — AB (ref 70–99)

## 2014-01-22 MED ORDER — GLIPIZIDE ER 5 MG PO TB24: 5.0000 mg | ORAL_TABLET | Freq: Every day | ORAL | Status: DC

## 2014-01-22 NOTE — Progress Notes (Unsigned)
S:    Patient arrives for 2 week follow up for diabetes management. Patient has taken Metformin 1000 mg BID.  Checking blood sugars twice daily.   O:  . Lab Results  Component Value Date   HGBA1C 9.8 12/31/2013     home fasting CBG readings of 130-235. 2 hour post-prandial/random CBG readings of 131-260. In office:  224 (2 hours after meal)   A/P: Pt's blood sugars are not controlled with just Metformin 1000 mg BID.  His sister died in [place] and he has been stressing about this. Will continue with Metformin 1000 mg BID and add Glipizide XL 5mg  with breakfast. Pt will continue to check blood sugars 2-3 times daily. Follow up in 2 weeks.

## 2014-02-03 ENCOUNTER — Encounter: Payer: Self-pay | Admitting: Pharmacist

## 2014-02-03 ENCOUNTER — Ambulatory Visit: Payer: Medicaid Other | Attending: Internal Medicine | Admitting: Pharmacist

## 2014-02-03 VITALS — BP 133/84 | HR 70

## 2014-02-03 DIAGNOSIS — E119 Type 2 diabetes mellitus without complications: Secondary | ICD-10-CM

## 2014-02-03 NOTE — Progress Notes (Unsigned)
S:    Patient arrives for 2 week follow up since starting Glipizide XL 5 mg.  He is also taking Metformin 1000 mg BID.  Pt does not have his blood sugar log with him today.  He is here with an interpreter.  O:  . Lab Results  Component Value Date   HGBA1C 9.8 12/31/2013     home fasting CBG readings of 120-150  2 hour post-prandial/random CBG readings of 120-150.  A/P:  Pt stated that he felt blood sugars were getting better since starting Glipizide.   Will make no changes at this time regarding medications. Pt has followup visit with Mateo Flow in 2 weeks.  He is to bring log book to this appt. He will be going to diabetes education tomorrow.

## 2014-02-04 ENCOUNTER — Other Ambulatory Visit: Payer: Self-pay | Admitting: Internal Medicine

## 2014-02-04 ENCOUNTER — Encounter: Payer: Medicaid Other | Attending: Internal Medicine | Admitting: Dietician

## 2014-02-04 ENCOUNTER — Encounter: Payer: Self-pay | Admitting: Dietician

## 2014-02-04 VITALS — Ht 68.5 in | Wt 223.2 lb

## 2014-02-04 DIAGNOSIS — Z713 Dietary counseling and surveillance: Secondary | ICD-10-CM | POA: Insufficient documentation

## 2014-02-04 DIAGNOSIS — E119 Type 2 diabetes mellitus without complications: Secondary | ICD-10-CM

## 2014-02-04 DIAGNOSIS — E1142 Type 2 diabetes mellitus with diabetic polyneuropathy: Secondary | ICD-10-CM | POA: Insufficient documentation

## 2014-02-04 NOTE — Progress Notes (Signed)
Appt start time: 1400 end time:  1500.  Assessment:  Patient was seen on 02/04/14 for individual diabetes education. Pt reports with an interpreter who happens to know him personally, and reports previously very high intake of regular soda, rice, breads. Pt states he has lost some weight, about 7 kgs.   Current HbA1c: 9.8  Preferred Learning Style:   No preference indicated   Learning Readiness:   Contemplating  MEDICATIONS: see list.  DIETARY INTAKE: Usual eating pattern includes 3 meals and 1-2 snacks per day. Everyday foods include fruit, rice.  Avoided foods include pork.    24-hr recall:  B ( AM): one egg or just cheese (4-5 cubes), with a hollah bread and tea with sugar. Snk ( AM): fruit  L ( PM): fish or chix and rice and bread with lots of vegetables (tomatoes, lettuce, cucumber), also apple or banana. Drinks vanilla flavored full fat yogurt (or kefir). Snk ( PM): fruit (orange, banana, apple). Diet coke.  D ( PM): liver, bread, fruits. Sometimes rice, tomatoes. Sometimes just fruit as opposed to dinner. Diet coke. "Used to eat lots of rice".  Snk ( PM): none.  Beverages: tea with sugar, yogurt, diet coke, sometimes orange juice, no kool aid, used to drink regular soda now switched to diet coke, 2 bottles of water in the morning  Usual physical activity: now walking a good bit at a brisk pace. Usually goes 1-1.5 hours per day walking. Pt has chronic knee pain, but walks anyway.  Progress Towards Goal(s):  In progress.   Nutritional Diagnosis:  NI-5.8.2 Excessive carbohydrate intake As related to large portions of grains, particularly rice, type 2 diabetes.  As evidenced by HgbA1c 9.8, pt diet recall and reports of large rice intake.    Intervention:  Nutrition counseling provided.  Discussed diabetes disease process and treatment options.  Discussed physiology of diabetes and role of obesity on insulin resistance.  Encouraged moderate weight reduction to improve glucose  levels.  Discussed role of medications and diet in glucose control  Provided education on macronutrients on glucose levels.  Provided education on carb counting, importance of regularly scheduled meals/snacks, and meal planning  Discussed effects of physical activity on glucose levels and long-term glucose control.  Recommended 150 minutes of physical activity/week.  Reviewed patient medications.  Discussed role of medication on blood glucose and possible side effects  Discussed blood glucose monitoring and interpretation.  Discussed recommended target ranges and individual ranges.    Described short-term complications: hyper- and hypo-glycemia.  Discussed causes,symptoms, and treatment options.  Discussed prevention, detection, and treatment of long-term complications.  Discussed the role of prolonged elevated glucose levels on body systems.  Discussed role of stress on blood glucose levels and discussed strategies to manage psychosocial issues.  Discussed recommendations for long-term diabetes self-care.  Established checklist for medical, dental, and emotional self-care.  Teaching Method Utilized:  Visual Auditory  Handouts given during visit include:  Living Well with Diabetes  Best Pro, Fat, and CHO rich foods for health  Barriers to learning/adherence to lifestyle change: no english spoken, cultural cuisine rich in starch, knee pain  Diabetes self-care support plan:   [facility] support group  RD emphasized the plate method for portion control, continued exercise, possibly switching to cycling or swimming to ease pressure on knee, choosing lean proteins.  Demonstrated degree of understanding via:  Teach Back   Monitoring/Evaluation:  Dietary intake, exercise, portion control, and body weight in 2 month(s).

## 2014-02-11 ENCOUNTER — Encounter: Payer: Self-pay | Admitting: Sports Medicine

## 2014-02-11 ENCOUNTER — Ambulatory Visit (INDEPENDENT_AMBULATORY_CARE_PROVIDER_SITE_OTHER): Payer: Medicaid Other | Admitting: Sports Medicine

## 2014-02-11 VITALS — BP 117/76 | Ht 69.0 in | Wt 221.0 lb

## 2014-02-11 DIAGNOSIS — M25561 Pain in right knee: Secondary | ICD-10-CM

## 2014-02-11 DIAGNOSIS — M25569 Pain in unspecified knee: Secondary | ICD-10-CM

## 2014-02-11 MED ORDER — METHYLPREDNISOLONE ACETATE 40 MG/ML IJ SUSP
40.0000 mg | Freq: Once | INTRAMUSCULAR | Status: AC
  Administered 2014-02-11: 40 mg via INTRA_ARTICULAR

## 2014-02-11 NOTE — Progress Notes (Signed)
   Subjective:    Patient ID: Joe Wallace, male    DOB: Jan 26, 1961, 53 y.o.   MRN: [id number]  HPI Patient comes in today for followup on right knee pain. He has had good pain relief with a recent cortisone injection but is requesting a second injection. It's been 2 months since his first injection. No swelling in his knee. Still some occasional pain along the medial aspect of the knee with activity. No locking or catching. History is obtained with help of an interpreter.    Review of Systems     Objective:   Physical Exam Well-developed, well-nourished. No acute distress. Awake alert and oriented x3. Vital signs reviewed.  Right knee: Full range of motion. No effusion. Tender to palpation along the medial joint line but a negative McMurray's. Good joint stability. Neurovascularly intact distally.       Assessment & Plan:  Right knee pain secondary to probable medial meniscal tear  Patient's right knee is reinjected today with cortisone. An anterior medial approach is utilized. Patient tolerated this without difficulty. I explained to him that we need to wait at least 6 months before repeating the injection. He understands. If symptoms worsen in the interim then I'll consider merits of further diagnostic imaging. Followup when necessary.  Consent obtained and verified. Time-out conducted. Noted no overlying erythema, induration, or other signs of local infection. Skin prepped in a sterile fashion. Topical analgesic spray: Ethyl chloride. Joint: right knee Needle:22g 1.5 inch  Completed without difficulty. Meds: 3cc 1% xylocaine, 1cc (40mg ) depomedrol  Advised to call if fevers/chills, erythema, induration, drainage, or persistent bleeding.

## 2014-02-17 ENCOUNTER — Ambulatory Visit: Payer: Medicaid Other | Admitting: Internal Medicine

## 2014-04-09 ENCOUNTER — Encounter: Payer: Self-pay | Admitting: *Deleted

## 2014-04-09 ENCOUNTER — Encounter: Payer: Medicaid Other | Attending: Internal Medicine | Admitting: *Deleted

## 2014-04-09 DIAGNOSIS — Z713 Dietary counseling and surveillance: Secondary | ICD-10-CM | POA: Diagnosis not present

## 2014-04-09 DIAGNOSIS — E119 Type 2 diabetes mellitus without complications: Secondary | ICD-10-CM | POA: Diagnosis not present

## 2014-04-09 NOTE — Progress Notes (Signed)
Appt start time: 1600 end time:  1630.  Assessment:  Patient was seen on 04/09/14 for individual diabetes education follow up. He is here with his interpretor again. 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  He states he continues to SMBG with reported range of 140-160 mg/dl after meals.  No more regular soda, only diet now. Very happy with weight loss and states he feels much better.  Current HbA1c: 9.8  Preferred Learning Style:   No preference indicated   Learning Readiness:   Contemplating  MEDICATIONS: see list.  DIETARY INTAKE: Usual eating pattern includes 3 meals and 1-2 snacks per day. Everyday foods include fruit, rice.  Avoided foods include pork.    24-hr recall:  B ( AM): one egg or just cheese (4-5 cubes), with a hollah bread and tea with sugar. Snk ( AM): fruit  L ( PM): fish or chix and rice and bread with lots of vegetables (tomatoes, lettuce, cucumber), also apple or banana. Drinks vanilla flavored full fat yogurt (or kefir). Snk ( PM): fruit (orange, banana, apple). Diet coke.  D ( PM): liver, bread, fruits. Sometimes rice, tomatoes. Sometimes just fruit as opposed to dinner. Diet coke. "Used to eat lots of rice".  Snk ( PM): none.  Beverages: tea with sugar, yogurt, diet coke, sometimes orange juice, no kool aid, used to drink regular soda now switched to diet coke, 2 bottles of water in the morning  Usual physical activity: physically active with work and he continues walking a good bit at a brisk pace. Usually goes 1-1.5 hours per day walking.   Progress Towards Goal(s):  Resolved.   Nutritional Diagnosis:  NI-5.8.2 No longer excessive carbohydrate intake As related to large portions of grains, particularly rice, type 2 diabetes.  As evidenced by HgbA1c 9.8. More recent A1c not yet available    Intervention:  Nutrition counseling provided. Commended him on his improved eating habits  and increased activity level Encouraged him to continue SMBG  He had no further questions at this time.  Teaching Method Utilized:  Visual Auditory  Handouts given during visit include:  No new handouts at this visit  Barriers to learning/adherence to lifestyle change: no english spoken, cultural cuisine rich in starch, knee pain  Diabetes self-care support plan:   [facility] support group  Demonstrated degree of understanding via:  Teach Back   Monitoring/Evaluation:  Dietary intake, exercise, portion control, and body weight PRN.

## 2014-06-04 ENCOUNTER — Encounter (HOSPITAL_COMMUNITY): Payer: Self-pay | Admitting: Emergency Medicine

## 2014-06-04 ENCOUNTER — Emergency Department (HOSPITAL_COMMUNITY)
Admission: EM | Admit: 2014-06-04 | Discharge: 2014-06-04 | Disposition: A | Discharge status: Home / Self Care | Payer: Medicaid Other | Source: Home / Self Care | Attending: Family Medicine | Admitting: Family Medicine

## 2014-06-04 DIAGNOSIS — J069 Acute upper respiratory infection, unspecified: Secondary | ICD-10-CM

## 2014-06-04 MED ORDER — DICLOFENAC SODIUM 50 MG PO TBEC: 50.0000 mg | DELAYED_RELEASE_TABLET | Freq: Two times a day (BID) | ORAL | Status: DC | PRN

## 2014-06-04 MED ORDER — IPRATROPIUM BROMIDE 0.06 % NA SOLN: 2.0000 | Freq: Four times a day (QID) | NASAL | Status: DC

## 2014-06-04 NOTE — Discharge Instructions (Signed)
Thank you for coming in today. Call or go to the emergency room if you get worse, have trouble breathing, have chest pains, or palpitations.   STOP iburpofen  Start diclofenac Use nasal spray  Upper Respiratory Infection, Adult An upper respiratory infection (URI) is also sometimes known as the common cold. The upper respiratory tract includes the nose, sinuses, throat, trachea, and bronchi. Bronchi are the airways leading to the lungs. Most people improve within 1 week, but symptoms can last up to 2 weeks. A residual cough may last even longer.  CAUSES Many different viruses can infect the tissues lining the upper respiratory tract. The tissues become irritated and inflamed and often become very moist. Mucus production is also common. A cold is contagious. You can easily spread the virus to others by oral contact. This includes kissing, sharing a glass, coughing, or sneezing. Touching your mouth or nose and then touching a surface, which is then touched by another person, can also spread the virus. SYMPTOMS  Symptoms typically develop 1 to 3 days after you come in contact with a cold virus. Symptoms vary from person to person. They may include:  Runny nose.  Sneezing.  Nasal congestion.  Sinus irritation.  Sore throat.  Loss of voice (laryngitis).  Cough.  Fatigue.  Muscle aches.  Loss of appetite.  Headache.  Low-grade fever. DIAGNOSIS  You might diagnose your own cold based on familiar symptoms, since most people get a cold 2 to 3 times a year. Your caregiver can confirm this based on your exam. Most importantly, your caregiver can check that your symptoms are not due to another disease such as strep throat, sinusitis, pneumonia, asthma, or epiglottitis. Blood tests, throat tests, and X-rays are not necessary to diagnose a common cold, but they may sometimes be helpful in excluding other more serious diseases. Your caregiver will decide if any further tests are  required. RISKS AND COMPLICATIONS  You may be at risk for a more severe case of the common cold if you smoke cigarettes, have chronic heart disease (such as heart failure) or lung disease (such as asthma), or if you have a weakened immune system. The very young and very old are also at risk for more serious infections. Bacterial sinusitis, middle ear infections, and bacterial pneumonia can complicate the common cold. The common cold can worsen asthma and chronic obstructive pulmonary disease (COPD). Sometimes, these complications can require emergency medical care and may be life-threatening. PREVENTION  The best way to protect against getting a cold is to practice good hygiene. Avoid oral or hand contact with people with cold symptoms. Wash your hands often if contact occurs. There is no clear evidence that vitamin C, vitamin E, echinacea, or exercise reduces the chance of developing a cold. However, it is always recommended to get plenty of rest and practice good nutrition. TREATMENT  Treatment is directed at relieving symptoms. There is no cure. Antibiotics are not effective, because the infection is caused by a virus, not by bacteria. Treatment may include:  Increased fluid intake. Sports drinks offer valuable electrolytes, sugars, and fluids.  Breathing heated mist or steam (vaporizer or shower).  Eating chicken soup or other clear broths, and maintaining good nutrition.  Getting plenty of rest.  Using gargles or lozenges for comfort.  Controlling fevers with ibuprofen or acetaminophen as directed by your caregiver.  Increasing usage of your inhaler if you have asthma. Zinc gel and zinc lozenges, taken in the first 24 hours of the common cold,  can shorten the duration and lessen the severity of symptoms. Pain medicines may help with fever, muscle aches, and throat pain. A variety of non-prescription medicines are available to treat congestion and runny nose. Your caregiver can make  recommendations and may suggest nasal or lung inhalers for other symptoms.  HOME CARE INSTRUCTIONS   Only take over-the-counter or prescription medicines for pain, discomfort, or fever as directed by your caregiver.  Use a warm mist humidifier or inhale steam from a shower to increase air moisture. This may keep secretions moist and make it easier to breathe.  Drink enough water and fluids to keep your urine clear or pale yellow.  Rest as needed.  Return to work when your temperature has returned to normal or as your caregiver advises. You may need to stay home longer to avoid infecting others. You can also use a face mask and careful hand washing to prevent spread of the virus. SEEK MEDICAL CARE IF:   After the first few days, you feel you are getting worse rather than better.  You need your caregiver's advice about medicines to control symptoms.  You develop chills, worsening shortness of breath, or brown or red sputum. These may be signs of pneumonia.  You develop yellow or brown nasal discharge or pain in the face, especially when you bend forward. These may be signs of sinusitis.  You develop a fever, swollen neck glands, pain with swallowing, or white areas in the back of your throat. These may be signs of strep throat. SEEK IMMEDIATE MEDICAL CARE IF:   You have a fever.  You develop severe or persistent headache, ear pain, sinus pain, or chest pain.  You develop wheezing, a prolonged cough, cough up blood, or have a change in your usual mucus (if you have chronic lung disease).  You develop sore muscles or a stiff neck. Document Released: 03/07/2001 Document Revised: 12/04/2011 Document Reviewed: 12/17/2013 [facility] Patient Information 2015 [place], [place]. This information is not intended to replace advice given to you by your health care provider. Make sure you discuss any questions you have with your health care provider.

## 2014-06-04 NOTE — ED Notes (Signed)
C/o  Elevated BP when at therapy.  Sore throat.  Headache.  And back pain.  No relief with otc meds.

## 2014-06-04 NOTE — ED Provider Notes (Signed)
Joe Wallace is a 53 y.o. male who presents to Urgent Care today for sore throat headache body aches present for one day. Patient has not tried any medications. No fevers or chills nausea vomiting or diarrhea. Additionally his blood pressure was found to be elevated at physical therapy yesterday.   Past Medical History  Diagnosis Date  . Diabetes mellitus without complication    History  Substance Use Topics  . Smoking status: Never Smoker   . Smokeless tobacco: Not on file  . Alcohol Use: No   ROS as above Medications: No current facility-administered medications for this encounter.   Current Outpatient Prescriptions  Medication Sig Dispense Refill  . ibuprofen (ADVIL,MOTRIN) 600 MG tablet Take 1 tablet (600 mg total) by mouth every 8 (eight) hours as needed.  90 tablet  1  . metFORMIN (GLUCOPHAGE) 1000 MG tablet Take 1 tablet (1,000 mg total) by mouth 2 (two) times daily with a meal.  60 tablet  3  . acetaminophen-codeine (TYLENOL #3) 300-30 MG per tablet TAKE 1 TABLET BY MOUTH EVERY 4 HOURS AS NEEDED  60 tablet  0  . atorvastatin (LIPITOR) 20 MG tablet Take 1 tablet (20 mg total) by mouth daily.  90 tablet  3  . Blood Glucose Monitoring Suppl (ACCU-CHEK AVIVA) device Use as instructed  1 each  0  . diclofenac (VOLTAREN) 50 MG EC tablet Take 1 tablet (50 mg total) by mouth 2 (two) times daily as needed.  30 tablet  0  . diclofenac sodium (VOLTAREN) 1 % GEL Apply 2 g topically 4 (four) times daily.  2 Tube  1  . gemfibrozil (LOPID) 600 MG tablet Take 1 tablet (600 mg total) by mouth 2 (two) times daily before a meal.  60 tablet  5  . glipiZIDE (GLUCOTROL XL) 5 MG 24 hr tablet Take 1 tablet (5 mg total) by mouth daily with breakfast.  30 tablet  3  . glucose blood (ACCU-CHEK AVIVA) test strip Use as instructed  100 each  12  . ipratropium (ATROVENT) 0.06 % nasal spray Place 2 sprays into both nostrils 4 (four) times daily.  15 mL  1  . Lancet Devices (ACCU-CHEK SOFTCLIX) lancets Use  as instructed  1 each  0    Exam:  BP 120/82  Pulse 90  Temp(Src) 97.5 F (36.4 C) (Oral)  Resp 16  SpO2 98% Gen: Well NAD HEENT: EOMI,  MMM, posterior pharynx with cobblestoning. Normal tympanic membranes bilaterally. Lungs: Normal work of breathing. CTABL Heart: RRR no MRG Abd: NABS, Soft. Nondistended, Nontender Exts: Brisk capillary refill, warm and well perfused.   No results found for this or any previous visit (from the past 24 hour(s)). No results found.  Assessment and Plan: 53 y.o. male with viral URI. Plan for diclofenac and Atrovent nasal spray.  Discussed warning signs or symptoms. Please see discharge instructions. Patient expresses understanding.   This note was created using Systems analyst. Any transcription errors are unintended.    Gregor Hams, MD 06/04/14 [phone]

## 2014-07-01 ENCOUNTER — Encounter (HOSPITAL_COMMUNITY): Payer: Self-pay | Admitting: Emergency Medicine

## 2014-07-01 ENCOUNTER — Emergency Department (HOSPITAL_COMMUNITY)
Admission: EM | Admit: 2014-07-01 | Discharge: 2014-07-01 | Disposition: A | Discharge status: Home / Self Care | Payer: Medicaid Other | Source: Home / Self Care | Attending: Family Medicine | Admitting: Family Medicine

## 2014-07-01 DIAGNOSIS — J029 Acute pharyngitis, unspecified: Secondary | ICD-10-CM

## 2014-07-01 DIAGNOSIS — M25511 Pain in right shoulder: Secondary | ICD-10-CM

## 2014-07-01 LAB — POCT URINALYSIS DIP (DEVICE)
Bilirubin Urine: NEGATIVE
Glucose, UA: NEGATIVE mg/dL
Hgb urine dipstick: NEGATIVE
KETONES UR: NEGATIVE mg/dL
Leukocytes, UA: NEGATIVE
Nitrite: NEGATIVE
PH: 5.5 (ref 5.0–8.0)
Protein, ur: NEGATIVE mg/dL
Specific Gravity, Urine: 1.02 (ref 1.005–1.030)
Urobilinogen, UA: 1 mg/dL (ref 0.0–1.0)

## 2014-07-01 MED ORDER — TRAMADOL HCL 50 MG PO TABS: 50.0000 mg | ORAL_TABLET | Freq: Four times a day (QID) | ORAL | Status: DC | PRN

## 2014-07-01 NOTE — ED Provider Notes (Signed)
Joe Wallace is a 53 y.o. male who presents to Urgent Care today for right trapezius pain and sore throat and bladder pain. Symptoms present for several days. Patient has a history of significant left shoulder pain and is currently wearing a sling. The trapezius pain he thinks is attributable to that shoulder sling. Has not tried any medications yet. He denies any urinary frequency or urgency. No penis discharge.   Past Medical History  Diagnosis Date  . Diabetes mellitus without complication    History  Substance Use Topics  . Smoking status: Never Smoker   . Smokeless tobacco: Not on file  . Alcohol Use: No   ROS as above Medications: No current facility-administered medications for this encounter.   Current Outpatient Prescriptions  Medication Sig Dispense Refill  . acetaminophen-codeine (TYLENOL #3) 300-30 MG per tablet TAKE 1 TABLET BY MOUTH EVERY 4 HOURS AS NEEDED  60 tablet  0  . atorvastatin (LIPITOR) 20 MG tablet Take 1 tablet (20 mg total) by mouth daily.  90 tablet  3  . Blood Glucose Monitoring Suppl (ACCU-CHEK AVIVA) device Use as instructed  1 each  0  . diclofenac sodium (VOLTAREN) 1 % GEL Apply 2 g topically 4 (four) times daily.  2 Tube  1  . gemfibrozil (LOPID) 600 MG tablet Take 1 tablet (600 mg total) by mouth 2 (two) times daily before a meal.  60 tablet  5  . glipiZIDE (GLUCOTROL XL) 5 MG 24 hr tablet Take 1 tablet (5 mg total) by mouth daily with breakfast.  30 tablet  3  . glucose blood (ACCU-CHEK AVIVA) test strip Use as instructed  100 each  12  . ibuprofen (ADVIL,MOTRIN) 600 MG tablet Take 1 tablet (600 mg total) by mouth every 8 (eight) hours as needed.  90 tablet  1  . ipratropium (ATROVENT) 0.06 % nasal spray Place 2 sprays into both nostrils 4 (four) times daily.  15 mL  1  . Lancet Devices (ACCU-CHEK SOFTCLIX) lancets Use as instructed  1 each  0  . metFORMIN (GLUCOPHAGE) 1000 MG tablet Take 1 tablet (1,000 mg total) by mouth 2 (two) times daily with a  meal.  60 tablet  3  . traMADol (ULTRAM) 50 MG tablet Take 1 tablet (50 mg total) by mouth every 6 (six) hours as needed.  15 tablet  0    Exam:  BP 125/83  Pulse 72  Temp(Src) 97.5 F (36.4 C) (Oral)  Resp 18  SpO2 96% Gen: Well NAD HEENT: EOMI,  MMM posterior versus mildly erythematous without exudate. Normal tympanic membranes bilaterally. Lungs: Normal work of breathing. CTABL Heart: RRR no MRG Abd: NABS, Soft. Nondistended, Nontender Exts: Brisk capillary refill, warm and well perfused.  Neck: Nontender to spinal midline. Tender palpation right trapezius. Normal neck range of motion.   Results for orders placed during the hospital encounter of 07/01/14 (from the past 24 hour(s))  POCT URINALYSIS DIP (DEVICE)     Status: None   Collection Time    07/01/14  8:16 PM      Result Value Ref Range   Glucose, UA NEGATIVE  NEGATIVE mg/dL   Bilirubin Urine NEGATIVE  NEGATIVE   Ketones, ur NEGATIVE  NEGATIVE mg/dL   Specific Gravity, Urine 1.020  1.005 - 1.030   Hgb urine dipstick NEGATIVE  NEGATIVE   pH 5.5  5.0 - 8.0   Protein, ur NEGATIVE  NEGATIVE mg/dL   Urobilinogen, UA 1.0  0.0 - 1.0 mg/dL  Nitrite NEGATIVE  NEGATIVE   Leukocytes, UA NEGATIVE  NEGATIVE   No results found.  Assessment and Plan: 53 y.o. male with  1) right trapezius pain due to sling. Tramadol diclofenac as needed 2) sore throat. Viral URI. Diclofenac as needed 3) bladder pain: Unclear etiology. Followup with PCP.  Discussed warning signs or symptoms. Please see discharge instructions. Patient expresses understanding.     Gregor Hams, MD 07/01/14 2125

## 2014-07-01 NOTE — Discharge Instructions (Signed)
Thank you for coming in today. Call or go to the emergency room if you get worse, have trouble breathing, have chest pains, or palpitations.   Cervical Strain and Sprain (Whiplash) with Rehab Cervical strain and sprain are injuries that commonly occur with "whiplash" injuries. Whiplash occurs when the neck is forcefully whipped backward or forward, such as during a motor vehicle accident or during contact sports. The muscles, ligaments, tendons, discs, and nerves of the neck are susceptible to injury when this occurs. RISK FACTORS Risk of having a whiplash injury increases if:  Osteoarthritis of the spine.  Situations that make head or neck accidents or trauma more likely.  High-risk sports (football, rugby, wrestling, hockey, auto racing, gymnastics, diving, contact karate, or boxing).  Poor strength and flexibility of the neck.  Previous neck injury.  Poor tackling technique.  Improperly fitted or padded equipment. SYMPTOMS   Pain or stiffness in the front or back of neck or both.  Symptoms may present immediately or up to 24 hours after injury.  Dizziness, headache, nausea, and vomiting.  Muscle spasm with soreness and stiffness in the neck.  Tenderness and swelling at the injury site. PREVENTION  Learn and use proper technique (avoid tackling with the head, spearing, and head-butting; use proper falling techniques to avoid landing on the head).  Warm up and stretch properly before activity.  Maintain physical fitness:  Strength, flexibility, and endurance.  Cardiovascular fitness.  Wear properly fitted and padded protective equipment, such as padded soft collars, for participation in contact sports. PROGNOSIS  Recovery from cervical strain and sprain injuries is dependent on the extent of the injury. These injuries are usually curable in 1 week to 3 months with appropriate treatment.  RELATED COMPLICATIONS   Temporary numbness and weakness may occur if the nerve  roots are damaged, and this may persist until the nerve has completely healed.  Chronic pain due to frequent recurrence of symptoms.  Prolonged healing, especially if activity is resumed too soon (before complete recovery). TREATMENT  Treatment initially involves the use of ice and medication to help reduce pain and inflammation. It is also important to perform strengthening and stretching exercises and modify activities that worsen symptoms so the injury does not get worse. These exercises may be performed at home or with a therapist. For patients who experience severe symptoms, a soft, padded collar may be recommended to be worn around the neck.  Improving your posture may help reduce symptoms. Posture improvement includes pulling your chin and abdomen in while sitting or standing. If you are sitting, sit in a firm chair with your buttocks against the back of the chair. While sleeping, try replacing your pillow with a small towel rolled to 2 inches in diameter, or use a cervical pillow or soft cervical collar. Poor sleeping positions delay healing.  For patients with nerve root damage, which causes numbness or weakness, the use of a cervical traction apparatus may be recommended. Surgery is rarely necessary for these injuries. However, cervical strain and sprains that are present at birth (congenital) may require surgery. MEDICATION   If pain medication is necessary, nonsteroidal anti-inflammatory medications, such as aspirin and ibuprofen, or other minor pain relievers, such as acetaminophen, are often recommended.  Do not take pain medication for 7 days before surgery.  Prescription pain relievers may be given if deemed necessary by your caregiver. Use only as directed and only as much as you need. HEAT AND COLD:   Cold treatment (icing) relieves pain and reduces  inflammation. Cold treatment should be applied for 10 to 15 minutes every 2 to 3 hours for inflammation and pain and immediately  after any activity that aggravates your symptoms. Use ice packs or an ice massage.  Heat treatment may be used prior to performing the stretching and strengthening activities prescribed by your caregiver, physical therapist, or athletic trainer. Use a heat pack or a warm soak. SEEK MEDICAL CARE IF:   Symptoms get worse or do not improve in 2 weeks despite treatment.  New, unexplained symptoms develop (drugs used in treatment may produce side effects). EXERCISES RANGE OF MOTION (ROM) AND STRETCHING EXERCISES - Cervical Strain and Sprain These exercises may help you when beginning to rehabilitate your injury. In order to successfully resolve your symptoms, you must improve your posture. These exercises are designed to help reduce the forward-head and rounded-shoulder posture which contributes to this condition. Your symptoms may resolve with or without further involvement from your physician, physical therapist or athletic trainer. While completing these exercises, remember:   Restoring tissue flexibility helps normal motion to return to the joints. This allows healthier, less painful movement and activity.  An effective stretch should be held for at least 20 seconds, although you may need to begin with shorter hold times for comfort.  A stretch should never be painful. You should only feel a gentle lengthening or release in the stretched tissue. STRETCH- Axial Extensors  Lie on your back on the floor. You may bend your knees for comfort. Place a rolled-up hand towel or dish towel, about 2 inches in diameter, under the part of your head that makes contact with the floor.  Gently tuck your chin, as if trying to make a "double chin," until you feel a gentle stretch at the base of your head.  Hold __________ seconds. Repeat __________ times. Complete this exercise __________ times per day.  STRETCH - Axial Extension   Stand or sit on a firm surface. Assume a good posture: chest up, shoulders  drawn back, abdominal muscles slightly tense, knees unlocked (if standing) and feet hip width apart.  Slowly retract your chin so your head slides back and your chin slightly lowers. Continue to look straight ahead.  You should feel a gentle stretch in the back of your head. Be certain not to feel an aggressive stretch since this can cause headaches later.  Hold for __________ seconds. Repeat __________ times. Complete this exercise __________ times per day. STRETCH - Cervical Side Bend   Stand or sit on a firm surface. Assume a good posture: chest up, shoulders drawn back, abdominal muscles slightly tense, knees unlocked (if standing) and feet hip width apart.  Without letting your nose or shoulders move, slowly tip your right / left ear to your shoulder until your feel a gentle stretch in the muscles on the opposite side of your neck.  Hold __________ seconds. Repeat __________ times. Complete this exercise __________ times per day. STRETCH - Cervical Rotators   Stand or sit on a firm surface. Assume a good posture: chest up, shoulders drawn back, abdominal muscles slightly tense, knees unlocked (if standing) and feet hip width apart.  Keeping your eyes level with the ground, slowly turn your head until you feel a gentle stretch along the back and opposite side of your neck.  Hold __________ seconds. Repeat __________ times. Complete this exercise __________ times per day. RANGE OF MOTION - Neck Circles   Stand or sit on a firm surface. Assume a good posture: chest  up, shoulders drawn back, abdominal muscles slightly tense, knees unlocked (if standing) and feet hip width apart.  Gently roll your head down and around from the back of one shoulder to the back of the other. The motion should never be forced or painful.  Repeat the motion 10-20 times, or until you feel the neck muscles relax and loosen. Repeat __________ times. Complete the exercise __________ times per  day. STRENGTHENING EXERCISES - Cervical Strain and Sprain These exercises may help you when beginning to rehabilitate your injury. They may resolve your symptoms with or without further involvement from your physician, physical therapist, or athletic trainer. While completing these exercises, remember:   Muscles can gain both the endurance and the strength needed for everyday activities through controlled exercises.  Complete these exercises as instructed by your physician, physical therapist, or athletic trainer. Progress the resistance and repetitions only as guided.  You may experience muscle soreness or fatigue, but the pain or discomfort you are trying to eliminate should never worsen during these exercises. If this pain does worsen, stop and make certain you are following the directions exactly. If the pain is still present after adjustments, discontinue the exercise until you can discuss the trouble with your clinician. STRENGTH - Cervical Flexors, Isometric  Face a wall, standing about 6 inches away. Place a small pillow, a ball about 6-8 inches in diameter, or a folded towel between your forehead and the wall.  Slightly tuck your chin and gently push your forehead into the soft object. Push only with mild to moderate intensity, building up tension gradually. Keep your jaw and forehead relaxed.  Hold 10 to 20 seconds. Keep your breathing relaxed.  Release the tension slowly. Relax your neck muscles completely before you start the next repetition. Repeat __________ times. Complete this exercise __________ times per day. STRENGTH- Cervical Lateral Flexors, Isometric   Stand about 6 inches away from a wall. Place a small pillow, a ball about 6-8 inches in diameter, or a folded towel between the side of your head and the wall.  Slightly tuck your chin and gently tilt your head into the soft object. Push only with mild to moderate intensity, building up tension gradually. Keep your jaw and  forehead relaxed.  Hold 10 to 20 seconds. Keep your breathing relaxed.  Release the tension slowly. Relax your neck muscles completely before you start the next repetition. Repeat __________ times. Complete this exercise __________ times per day. STRENGTH - Cervical Extensors, Isometric   Stand about 6 inches away from a wall. Place a small pillow, a ball about 6-8 inches in diameter, or a folded towel between the back of your head and the wall.  Slightly tuck your chin and gently tilt your head back into the soft object. Push only with mild to moderate intensity, building up tension gradually. Keep your jaw and forehead relaxed.  Hold 10 to 20 seconds. Keep your breathing relaxed.  Release the tension slowly. Relax your neck muscles completely before you start the next repetition. Repeat __________ times. Complete this exercise __________ times per day. POSTURE AND BODY MECHANICS CONSIDERATIONS - Cervical Strain and Sprain Keeping correct posture when sitting, standing or completing your activities will reduce the stress put on different body tissues, allowing injured tissues a chance to heal and limiting painful experiences. The following are general guidelines for improved posture. Your physician or physical therapist will provide you with any instructions specific to your needs. While reading these guidelines, remember:  The exercises prescribed  by your provider will help you have the flexibility and strength to maintain correct postures.  The correct posture provides the optimal environment for your joints to work. All of your joints have less wear and tear when properly supported by a spine with good posture. This means you will experience a healthier, less painful body.  Correct posture must be practiced with all of your activities, especially prolonged sitting and standing. Correct posture is as important when doing repetitive low-stress activities (typing) as it is when doing a single  heavy-load activity (lifting). PROLONGED STANDING WHILE SLIGHTLY LEANING FORWARD When completing a task that requires you to lean forward while standing in one place for a long time, place either foot up on a stationary 2- to 4-inch high object to help maintain the best posture. When both feet are on the ground, the low back tends to lose its slight inward curve. If this curve flattens (or becomes too large), then the back and your other joints will experience too much stress, fatigue more quickly, and can cause pain.  RESTING POSITIONS Consider which positions are most painful for you when choosing a resting position. If you have pain with flexion-based activities (sitting, bending, stooping, squatting), choose a position that allows you to rest in a less flexed posture. You would want to avoid curling into a fetal position on your side. If your pain worsens with extension-based activities (prolonged standing, working overhead), avoid resting in an extended position such as sleeping on your stomach. Most people will find more comfort when they rest with their spine in a more neutral position, neither too rounded nor too arched. Lying on a non-sagging bed on your side with a pillow between your knees, or on your back with a pillow under your knees will often provide some relief. Keep in mind, being in any one position for a prolonged period of time, no matter how correct your posture, can still lead to stiffness. WALKING Walk with an upright posture. Your ears, shoulders, and hips should all line up. OFFICE WORK When working at a desk, create an environment that supports good, upright posture. Without extra support, muscles fatigue and lead to excessive strain on joints and other tissues. CHAIR:  A chair should be able to slide under your desk when your back makes contact with the back of the chair. This allows you to work closely.  The chair's height should allow your eyes to be level with the upper  part of your monitor and your hands to be slightly lower than your elbows.  Body position:  Your feet should make contact with the floor. If this is not possible, use a foot rest.  Keep your ears over your shoulders. This will reduce stress on your neck and low back. Document Released: 09/11/2005 Document Revised: 01/26/2014 Document Reviewed: 12/24/2008 [facility] Patient Information 2015 [place], [place]. This information is not intended to replace advice given to you by your health care provider. Make sure you discuss any questions you have with your health care provider.

## 2014-07-01 NOTE — ED Notes (Signed)
Via Arabic interpreter Pt c/o pain on right side of neck onset 2-3 days Denies inj/trauma Alert, no signs of acute distress.

## 2014-09-16 ENCOUNTER — Emergency Department (HOSPITAL_COMMUNITY)
Admission: EM | Admit: 2014-09-16 | Discharge: 2014-09-16 | Disposition: A | Discharge status: Home / Self Care | Payer: Medicaid Other | Attending: Emergency Medicine | Admitting: Emergency Medicine

## 2014-09-16 ENCOUNTER — Encounter (HOSPITAL_COMMUNITY): Payer: Self-pay | Admitting: Emergency Medicine

## 2014-09-16 DIAGNOSIS — E119 Type 2 diabetes mellitus without complications: Secondary | ICD-10-CM | POA: Diagnosis not present

## 2014-09-16 DIAGNOSIS — Y9289 Other specified places as the place of occurrence of the external cause: Secondary | ICD-10-CM | POA: Diagnosis not present

## 2014-09-16 DIAGNOSIS — T50905A Adverse effect of unspecified drugs, medicaments and biological substances, initial encounter: Secondary | ICD-10-CM

## 2014-09-16 DIAGNOSIS — Y998 Other external cause status: Secondary | ICD-10-CM | POA: Diagnosis not present

## 2014-09-16 DIAGNOSIS — Y9389 Activity, other specified: Secondary | ICD-10-CM | POA: Insufficient documentation

## 2014-09-16 DIAGNOSIS — Z79899 Other long term (current) drug therapy: Secondary | ICD-10-CM | POA: Diagnosis not present

## 2014-09-16 DIAGNOSIS — T404X5A Adverse effect of other synthetic narcotics, initial encounter: Secondary | ICD-10-CM | POA: Diagnosis not present

## 2014-09-16 DIAGNOSIS — T39315A Adverse effect of propionic acid derivatives, initial encounter: Secondary | ICD-10-CM | POA: Diagnosis not present

## 2014-09-16 DIAGNOSIS — K219 Gastro-esophageal reflux disease without esophagitis: Secondary | ICD-10-CM | POA: Diagnosis not present

## 2014-09-16 DIAGNOSIS — R1013 Epigastric pain: Secondary | ICD-10-CM | POA: Diagnosis present

## 2014-09-16 LAB — CBC WITH DIFFERENTIAL/PLATELET
Basophils Absolute: 0 10*3/uL (ref 0.0–0.1)
Basophils Relative: 0 % (ref 0–1)
EOS PCT: 4 % (ref 0–5)
Eosinophils Absolute: 0.3 10*3/uL (ref 0.0–0.7)
HEMATOCRIT: 37.2 % — AB (ref 39.0–52.0)
HEMOGLOBIN: 11.8 g/dL — AB (ref 13.0–17.0)
LYMPHS PCT: 37 % (ref 12–46)
Lymphs Abs: 2.6 10*3/uL (ref 0.7–4.0)
MCH: 26.2 pg (ref 26.0–34.0)
MCHC: 31.7 g/dL (ref 30.0–36.0)
MCV: 82.5 fL (ref 78.0–100.0)
MONO ABS: 0.5 10*3/uL (ref 0.1–1.0)
MONOS PCT: 7 % (ref 3–12)
NEUTROS ABS: 3.7 10*3/uL (ref 1.7–7.7)
Neutrophils Relative %: 52 % (ref 43–77)
Platelets: 228 10*3/uL (ref 150–400)
RBC: 4.51 MIL/uL (ref 4.22–5.81)
RDW: 13.1 % (ref 11.5–15.5)
WBC: 7 10*3/uL (ref 4.0–10.5)

## 2014-09-16 LAB — COMPREHENSIVE METABOLIC PANEL
ALT: 23 U/L (ref 0–53)
ANION GAP: 7 (ref 5–15)
AST: 17 U/L (ref 0–37)
Albumin: 3.8 g/dL (ref 3.5–5.2)
Alkaline Phosphatase: 79 U/L (ref 39–117)
BILIRUBIN TOTAL: 0.1 mg/dL — AB (ref 0.3–1.2)
BUN: 20 mg/dL (ref 6–23)
CALCIUM: 8.9 mg/dL (ref 8.4–10.5)
CHLORIDE: 98 meq/L (ref 96–112)
CO2: 26 mmol/L (ref 19–32)
CREATININE: 1.02 mg/dL (ref 0.50–1.35)
GFR, EST NON AFRICAN AMERICAN: 81 mL/min — AB (ref >90)
GLUCOSE: 270 mg/dL — AB (ref 70–99)
Potassium: 3.5 mmol/L (ref 3.5–5.1)
Sodium: 131 mmol/L — ABNORMAL LOW (ref 135–145)
Total Protein: 6.6 g/dL (ref 6.0–8.3)

## 2014-09-16 LAB — I-STAT TROPONIN, ED: Troponin i, poc: 0 ng/mL (ref 0.00–0.08)

## 2014-09-16 LAB — LIPASE, BLOOD: LIPASE: 29 U/L (ref 11–59)

## 2014-09-16 MED ORDER — FAMOTIDINE 20 MG PO TABS: 20.0000 mg | ORAL_TABLET | Freq: Two times a day (BID) | ORAL | Status: DC

## 2014-09-16 MED ORDER — GI COCKTAIL ~~LOC~~
30.0000 mL | Freq: Once | ORAL | Status: AC
  Administered 2014-09-16: 30 mL via ORAL
  Filled 2014-09-16: qty 30

## 2014-09-16 MED ORDER — FAMOTIDINE 20 MG PO TABS
20.0000 mg | ORAL_TABLET | Freq: Once | ORAL | Status: AC
  Administered 2014-09-16: 20 mg via ORAL
  Filled 2014-09-16: qty 1

## 2014-09-16 NOTE — ED Notes (Signed)
Pt. Left with all belongings and refused wheelchair 

## 2014-09-16 NOTE — ED Notes (Signed)
Pt.has aurinal he knows that he has to urine for a sample

## 2014-09-16 NOTE — ED Provider Notes (Signed)
CSN: [id number]     Arrival date & time 09/16/14  0240 History  This chart was scribed for Daison Braxton Alfonso Patten, MD by Evelene Croon, ED Scribe. This patient was seen in room A11C/A11C and the patient's care was started 3:17 AM.     Chief Complaint  Patient presents with  . Abdominal Pain      Patient is a 53 y.o. male presenting with abdominal pain. The history is provided by the patient. No language interpreter was used.  Abdominal Pain Pain location:  Epigastric Pain quality: burning   Pain radiates to:  Does not radiate Pain severity:  Moderate Timing:  Constant Progression:  Unchanged Chronicity:  New Context: not diet changes   Context comment:  NSAIDS and tramadol which are new Relieved by:  Nothing Worsened by:  Nothing tried Ineffective treatments:  None tried Associated symptoms: no anorexia, no chest pain, no fever, no nausea and no vomiting   Risk factors: NSAID use   Risk factors: no aspirin use      HPI Comments:  Joe Wallace is a 52 y.o. male who presents to the Emergency Department complaining of constant burning epigastric pain since yesterday evening. Pt notes pain started after taking his meds- tramadol and naproxen- for the first time yesterday. He denies eating beforehand. Pt had left shoulder arthroscopy in July 21 2014 and was recently switched to these meds. He denies nausea and vomiting. No alleviating factors noted.   Past Medical History  Diagnosis Date  . Diabetes mellitus without complication    Past Surgical History  Procedure Laterality Date  . Shoulder arthroscopy     Family History  Problem Relation Age of Onset  . Diabetes Mother   . Hypertension Mother   . Hypertension Father   . Diabetes Father    History  Substance Use Topics  . Smoking status: Never Smoker   . Smokeless tobacco: Not on file  . Alcohol Use: No    Review of Systems  Constitutional: Negative for fever.  Respiratory: Negative for stridor.    Cardiovascular: Negative for chest pain.  Gastrointestinal: Positive for abdominal pain. Negative for nausea, vomiting and anorexia.  All other systems reviewed and are negative.     Allergies  Review of patient's allergies indicates no known allergies.  Home Medications   Prior to Admission medications   Medication Sig Start Date End Date Taking? Authorizing Provider  acetaminophen-codeine (TYLENOL #3) 300-30 MG per tablet TAKE 1 TABLET BY MOUTH EVERY 4 HOURS AS NEEDED    Olugbemiga E Jegede, MD  atorvastatin (LIPITOR) 20 MG tablet Take 1 tablet (20 mg total) by mouth daily. 01/01/14   Tresa Garter, MD  Blood Glucose Monitoring Suppl (ACCU-CHEK AVIVA) device Use as instructed 12/31/13 12/31/14  Tresa Garter, MD  diclofenac sodium (VOLTAREN) 1 % GEL Apply 2 g topically 4 (four) times daily. 08/18/13   Tresa Garter, MD  gemfibrozil (LOPID) 600 MG tablet Take 1 tablet (600 mg total) by mouth 2 (two) times daily before a meal. 08/28/13   Tresa Garter, MD  glipiZIDE (GLUCOTROL XL) 5 MG 24 hr tablet Take 1 tablet (5 mg total) by mouth daily with breakfast. 01/22/14   Tresa Garter, MD  glucose blood (ACCU-CHEK AVIVA) test strip Use as instructed 12/31/13   Tresa Garter, MD  ibuprofen (ADVIL,MOTRIN) 600 MG tablet Take 1 tablet (600 mg total) by mouth every 8 (eight) hours as needed. 09/15/13   Lorayne Marek, MD  ipratropium (  ATROVENT) 0.06 % nasal spray Place 2 sprays into both nostrils 4 (four) times daily. 06/04/14   Gregor Hams, MD  Lancet Devices [facility]) lancets Use as instructed 12/31/13   Tresa Garter, MD  metFORMIN (GLUCOPHAGE) 1000 MG tablet Take 1 tablet (1,000 mg total) by mouth 2 (two) times daily with a meal. 12/31/13   Tresa Garter, MD  traMADol (ULTRAM) 50 MG tablet Take 1 tablet (50 mg total) by mouth every 6 (six) hours as needed. 07/01/14   Gregor Hams, MD   BP 173/87 mmHg  Pulse 80  Temp(Src) 98.2 F (36.8 C) (Oral)   Resp 14  Ht 5\' 10"  (1.778 m)  Wt 229 lb (103.874 kg)  BMI 32.86 kg/m2  SpO2 99% Physical Exam  Constitutional: He is oriented to person, place, and time. He appears well-developed and well-nourished.  HENT:  Head: Normocephalic and atraumatic.  Eyes: Conjunctivae are normal. Pupils are equal, round, and reactive to light.  Neck: Normal range of motion. Neck supple.  Cardiovascular: Normal rate, regular rhythm and normal heart sounds.   Pulmonary/Chest: Effort normal and breath sounds normal. No respiratory distress. He has no wheezes. He has no rales.  Abdominal: Soft. He exhibits no distension. There is no tenderness. There is no rebound and no guarding.  Hyperactive bowel sounds in epigastrum  Musculoskeletal: Normal range of motion. He exhibits no edema or tenderness.  Neurological: He is alert and oriented to person, place, and time.  Skin: Skin is warm and dry.  Psychiatric: He has a normal mood and affect.  Nursing note and vitals reviewed.   ED Course  Procedures   DIAGNOSTIC STUDIES:  Oxygen Saturation is 99% on RA, normal by my interpretation.    COORDINATION OF CARE:  3:22 AM Discussed treatment plan with pt at bedside and pt agreed to plan.  Labs Review Labs Reviewed  CBC WITH DIFFERENTIAL  COMPREHENSIVE METABOLIC PANEL  URINALYSIS, ROUTINE W REFLEX MICROSCOPIC  LIPASE, BLOOD    Imaging Review No results found.   EKG Interpretation None      MDM   Final diagnoses:  None     Date: 09/16/2014  Rate: 74  Rhythm: normal sinus rhythm  QRS Axis: normal  Intervals: QT prolonged  ST/T Wave abnormalities: normal  Conduction Disutrbances:none  Narrative Interpretation:   Old EKG Reviewed: none available  Stop tramadol d/t prolonged QT and take naproxen with food on full stomach will start pepcid.  If symptoms persist d/c naproxen and use tylenol 650 every 6 hours and call your orthopedic surgeon.  Patient verbalizes understanding   I personally  performed the services described in this documentation, which was scribed in my presence. The recorded information has been reviewed and is accurate.     Carlisle Beers, MD 09/16/14 [phone]

## 2014-09-16 NOTE — ED Notes (Signed)
Pt. reports mid abdominal pain onset this evening after taking prescription Tramadol and Naproxen for his left shoulder s/p surgery . Denies nausea or vomitting.

## 2014-11-10 ENCOUNTER — Ambulatory Visit: Payer: Medicaid Other | Admitting: Sports Medicine

## 2014-11-17 ENCOUNTER — Encounter: Payer: Self-pay | Admitting: Sports Medicine

## 2014-11-17 ENCOUNTER — Ambulatory Visit (INDEPENDENT_AMBULATORY_CARE_PROVIDER_SITE_OTHER): Payer: Medicaid Other | Admitting: Sports Medicine

## 2014-11-17 VITALS — BP 145/86 | HR 82 | Ht 69.0 in | Wt 225.0 lb

## 2014-11-17 DIAGNOSIS — M25561 Pain in right knee: Secondary | ICD-10-CM

## 2014-11-17 NOTE — Progress Notes (Signed)
   Subjective:    Patient ID: Joe Wallace, male    DOB: [birth year], 54 y.o.   MRN: [id number]  HPI chief complaint: Right knee pain  Patient comes in today with returning right knee pain. He was last seen in the office in the summer of 2015. X-rays done in late 2014 showed minimal arthritic changes. At the time of his last office visit it was thought that he may have a degenerative medial meniscal tear. His knee was injected with cortisone which provided him with only about a month's worth of symptom relief. His pain is intermittent and sharp in quality. Localizes it primarily to the medial joint line. Mild swelling as well as. Some instability. No recent trauma. No numbness or tingling. He is here today with an interpreter. Interim medical history reviewed Medications reviewed Allergies reviewed    Review of Systems     Objective:   Physical Exam Well-developed, well-nourished. No acute distress.  Right knee: Full range of motion. No obvious effusion. He is tender to palpation along the medial joint line. No tenderness to palpation along the lateral joint line. Pain but no popping with McMurray's. Positive Thessaly's. Knee is stable to ligamentous exam. Minimal patellofemoral crepitus. Neurovascularly intact distally.  X-rays are as above.       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 Therefore, I will have the patient return to the office in a couple of weeks for ultrasound evaluation of his right knee. We may ultimately need to refer him to an orthopedist as for a diagnostic/therapeutic arthroscopy.

## 2014-12-02 ENCOUNTER — Other Ambulatory Visit: Payer: Medicaid Other | Admitting: Sports Medicine

## 2014-12-09 ENCOUNTER — Ambulatory Visit (INDEPENDENT_AMBULATORY_CARE_PROVIDER_SITE_OTHER): Payer: Medicaid Other | Admitting: Sports Medicine

## 2014-12-09 ENCOUNTER — Encounter: Payer: Self-pay | Admitting: Sports Medicine

## 2014-12-09 VITALS — Ht 69.0 in | Wt 225.0 lb

## 2014-12-09 DIAGNOSIS — M25561 Pain in right knee: Secondary | ICD-10-CM

## 2014-12-09 MED ORDER — TRAMADOL HCL 50 MG PO TABS: ORAL_TABLET | ORAL | Status: DC

## 2014-12-09 NOTE — Progress Notes (Signed)
Patient ID: Joe Wallace, male   DOB: [birth year], 54 y.o.   MRN: [id number]  Interpreter for visit is [name]

## 2014-12-10 NOTE — Progress Notes (Signed)
   Subjective:    Patient ID: Joe Wallace, male    DOB: 08-Oct-1960, 54 y.o.   MRN: [id number]  HPI  Patient comes in at my request for an ultrasound of his right knee. He is experiencing bilateral knee pain. X-rays done in 2014 showed only mild degenerative changes but these were non-standing x-rays. His pain is diffuse in both knees. Intermittent swelling. No real mechanical symptoms. Please note that the history is obtained with the help of an interpreter.    Review of Systems     Objective:   Physical Exam Well-developed, well-nourished. No acute distress  Examination of both knees shows range of motion from 0-120. No effusion. He is tender to palpation along both medial and lateral joint lines bilaterally. Pain but no popping with McMurray's. Good ligamentous stability. No palpable Baker's cyst. 1+ patellofemoral crepitus. Neurovascularly intact distally. Walking with a limp.  MSK ultrasound of the more symptomatic right knee was performed. No obvious joint effusion. The visualized portion of the lateral and medial meniscus showed no obvious tearing but there was some protrusion from the medial joint space. Patellar tendon looks good. Scan of the popliteal fossa shows no Baker's cyst.       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 He has shrapnel in his head which precludes [place] from doing an MRI. He will follow-up with me next week to review the x-rays and delineate further treatment. In the meantime I have refilled his tramadol.

## 2014-12-11 ENCOUNTER — Other Ambulatory Visit: Payer: Self-pay | Admitting: *Deleted

## 2014-12-11 ENCOUNTER — Ambulatory Visit
Admission: RE | Admit: 2014-12-11 | Discharge: 2014-12-11 | Disposition: A | Discharge status: Home / Self Care | Payer: Medicaid Other | Source: Ambulatory Visit | Attending: Sports Medicine | Admitting: Sports Medicine

## 2014-12-11 DIAGNOSIS — M25561 Pain in right knee: Secondary | ICD-10-CM

## 2014-12-11 DIAGNOSIS — M25562 Pain in left knee: Principal | ICD-10-CM

## 2014-12-16 ENCOUNTER — Ambulatory Visit (INDEPENDENT_AMBULATORY_CARE_PROVIDER_SITE_OTHER): Payer: Medicaid Other | Admitting: Sports Medicine

## 2014-12-16 ENCOUNTER — Encounter: Payer: Self-pay | Admitting: Sports Medicine

## 2014-12-16 VITALS — BP 137/77 | HR 85 | Ht 69.0 in | Wt 225.0 lb

## 2014-12-16 DIAGNOSIS — M25561 Pain in right knee: Secondary | ICD-10-CM

## 2014-12-16 DIAGNOSIS — M25562 Pain in left knee: Secondary | ICD-10-CM | POA: Diagnosis not present

## 2014-12-16 DIAGNOSIS — M5441 Lumbago with sciatica, right side: Secondary | ICD-10-CM | POA: Diagnosis not present

## 2014-12-16 DIAGNOSIS — M5442 Lumbago with sciatica, left side: Secondary | ICD-10-CM

## 2014-12-16 MED ORDER — METHYLPREDNISOLONE ACETATE 40 MG/ML IJ SUSP
40.0000 mg | Freq: Once | INTRAMUSCULAR | Status: AC
  Administered 2014-12-16: 40 mg via INTRA_ARTICULAR

## 2014-12-16 NOTE — Progress Notes (Signed)
Patient ID: Joe Wallace, male   DOB: 01/31/1961, 54 y.o.   MRN: [id number]   Interpreter for visit is [name]

## 2014-12-17 NOTE — Progress Notes (Signed)
   Subjective:    Patient ID: Joe Wallace, male    DOB: [birth year], 54 y.o.   MRN: [id number]  HPI  Patient comes in today for follow-up on bilateral knee pain. X-rays show some mild medial compartmental narrowing. He continues to have pain in both knees primarily in the posterior aspect of the knee. Today his left knee is bothering him more than the right. Occasional catching in the knee. Occasional swelling as well. He is complaining of low back pain and is requesting a lumbar x-ray. It sounds like he may have undergone lumbar ESI's but I don't have those records. He has also had his shoulder evaluated by Dr. Tamera Punt at [facility]. History is obtained with the help of an interpreter.    Review of Systems     Objective:   Physical Exam Well-developed, well-nourished. No acute distress. Sitting comfortable in exam room. Vital signs reviewed.  Lumbar spine: Limited lumbar range of motion. No bony or soft tissue tenderness to direct palpation. No focal neurological deficit of either lower extremity.  Examination of both knees show range of motion 0-120. No obvious effusion. Slight tenderness to palpation along the medial joint lines bilaterally. Negative Murray. No palpable Baker's cyst. Neurovascularly intact distally. Walking with a slight limp.  X-rays are as above       Assessment & Plan:  Bilateral knee pain secondary to mild medial compartmental DJD Low back pain  I recommend repeat cortisone injections today based on his x-rays and his recent ultrasound. This was accomplished atraumatically under sterile technique utilizing an anterior medial approach. I will also get an AP and lateral x-ray of his lumbar spine and will request the office notes from Dr. Tamera Punt. Patient will follow-up with me in 4 weeks. If knee pain persists then I would consider referral for possible diagnostic arthroscopy. Patient tells me that he cannot undergo an MRI due to shrapnel he has in his  skull.  Consent obtained and verified. Time-out conducted. Noted no overlying erythema, induration, or other signs of local infection. Skin prepped in a sterile fashion. Topical analgesic spray: Ethyl chloride. Joint: right knee Needle: 22g 1.5 inch Completed without difficulty. Meds: 3cc 1% xylocaine, 1cc (40mg ) depomedrol  Advised to call if fevers/chills, erythema, induration, drainage, or persistent bleeding.  Consent obtained and verified. Time-out conducted. Noted no overlying erythema, induration, or other signs of local infection. Skin prepped in a sterile fashion. Topical analgesic spray: Ethyl chloride. Joint: left knee Needle: 22g 1.5 inch Completed without difficulty. Meds: 3cc 1% xylocaine, 1cc (40mg ) depomedrol  Advised to call if fevers/chills, erythema, induration, drainage, or persistent bleeding.

## 2014-12-31 ENCOUNTER — Telehealth: Payer: Self-pay | Admitting: Emergency Medicine

## 2014-12-31 ENCOUNTER — Ambulatory Visit: Payer: Medicaid Other | Attending: Internal Medicine | Admitting: Internal Medicine

## 2014-12-31 ENCOUNTER — Encounter: Payer: Self-pay | Admitting: Internal Medicine

## 2014-12-31 VITALS — BP 142/87 | HR 68 | Temp 98.1°F | Resp 16 | Ht 69.0 in | Wt 224.0 lb

## 2014-12-31 DIAGNOSIS — E785 Hyperlipidemia, unspecified: Secondary | ICD-10-CM

## 2014-12-31 DIAGNOSIS — E119 Type 2 diabetes mellitus without complications: Secondary | ICD-10-CM | POA: Diagnosis not present

## 2014-12-31 DIAGNOSIS — E042 Nontoxic multinodular goiter: Secondary | ICD-10-CM | POA: Diagnosis not present

## 2014-12-31 DIAGNOSIS — M25512 Pain in left shoulder: Secondary | ICD-10-CM

## 2014-12-31 DIAGNOSIS — L0292 Furuncle, unspecified: Secondary | ICD-10-CM

## 2014-12-31 LAB — COMPLETE METABOLIC PANEL WITH GFR
ALT: 17 U/L (ref 0–53)
AST: 11 U/L (ref 0–37)
Albumin: 4.2 g/dL (ref 3.5–5.2)
Alkaline Phosphatase: 69 U/L (ref 39–117)
BILIRUBIN TOTAL: 0.4 mg/dL (ref 0.2–1.2)
BUN: 15 mg/dL (ref 6–23)
CALCIUM: 8.8 mg/dL (ref 8.4–10.5)
CO2: 27 meq/L (ref 19–32)
CREATININE: 0.89 mg/dL (ref 0.50–1.35)
Chloride: 99 mEq/L (ref 96–112)
Glucose, Bld: 192 mg/dL — ABNORMAL HIGH (ref 70–99)
POTASSIUM: 4.5 meq/L (ref 3.5–5.3)
SODIUM: 134 meq/L — AB (ref 135–145)
Total Protein: 7.5 g/dL (ref 6.0–8.3)

## 2014-12-31 LAB — LIPID PANEL
CHOLESTEROL: 200 mg/dL (ref 0–200)
HDL: 36 mg/dL — AB (ref >=40)
LDL CALC: 112 mg/dL — AB (ref 0–99)
Total CHOL/HDL Ratio: 5.6 Ratio
Triglycerides: 258 mg/dL — ABNORMAL HIGH (ref <150)
VLDL: 52 mg/dL — AB (ref 0–40)

## 2014-12-31 LAB — POCT GLYCOSYLATED HEMOGLOBIN (HGB A1C): HEMOGLOBIN A1C: 10.8

## 2014-12-31 LAB — GLUCOSE, POCT (MANUAL RESULT ENTRY): POC Glucose: 214 mg/dl — AB (ref 70–99)

## 2014-12-31 LAB — TSH: TSH: 1.752 u[IU]/mL (ref 0.350–4.500)

## 2014-12-31 MED ORDER — METFORMIN HCL 1000 MG PO TABS
1000.0000 mg | ORAL_TABLET | Freq: Two times a day (BID) | ORAL | Status: DC
Start: 2014-12-31 — End: 2015-01-01

## 2014-12-31 MED ORDER — ATORVASTATIN CALCIUM 20 MG PO TABS: 20.0000 mg | ORAL_TABLET | Freq: Every day | ORAL | Status: DC

## 2014-12-31 MED ORDER — GLIPIZIDE ER 5 MG PO TB24: 5.0000 mg | ORAL_TABLET | Freq: Every day | ORAL | Status: DC

## 2014-12-31 MED ORDER — TRIPLE ANTIBIOTIC 5-400-5000 EX OINT: TOPICAL_OINTMENT | Freq: Four times a day (QID) | CUTANEOUS | Status: DC

## 2014-12-31 NOTE — Progress Notes (Signed)
Patient ID: Joe Wallace, male   DOB: April 06, 1961, 54 y.o.   MRN: [id number]   Damone Fancher, is a 54 y.o. male  IWL:[id number]  RDE:[id number]  DOB - [birth year]  Chief Complaint  Patient presents with  . Annual Exam  . Neck Pain  . Diabetes        Subjective:   Joe Wallace is a 54 y.o. male here today for a follow up visit. Patient would like to review his DM meds He has a lump on his chest in between his breasts been there for 2 months and it is painful. Had left shoulder surgery in 06/2014. Pain in his neck and head 8/10 and numbness in his hands. Refuses tetanus shot Needs colonoscopy. Patient has No headache, No chest pain, No abdominal pain - No Nausea, No new weakness tingling or numbness, No Cough - SOB.  Problem  Multiple Thyroid Nodules  Left Shoulder Pain  Type 2 Diabetes Mellitus Without Complication  Furunculosis of Skin  Dyslipidemia    ALLERGIES: No Known Allergies  PAST MEDICAL HISTORY: Past Medical History  Diagnosis Date  . Diabetes mellitus without complication     MEDICATIONS AT HOME: Prior to Admission medications   Medication Sig Start Date End Date Taking? Authorizing Provider  acetaminophen-codeine (TYLENOL #3) 300-30 MG per tablet TAKE 1 TABLET BY MOUTH EVERY 4 HOURS AS NEEDED   Yes Tresa Garter, MD  atorvastatin (LIPITOR) 20 MG tablet Take 1 tablet (20 mg total) by mouth daily. 12/31/14  Yes Tresa Garter, MD  glipiZIDE (GLUCOTROL XL) 5 MG 24 hr tablet Take 1 tablet (5 mg total) by mouth daily with breakfast. 12/31/14  Yes Tresa Garter, MD  traMADol (ULTRAM) 50 MG tablet Take 1 tablet twice a day as needed for pain 12/09/14  Yes Thurman Coyer, DO  Blood Glucose Monitoring Suppl (ACCU-CHEK AVIVA) device Use as instructed Patient not taking: Reported on 12/31/2014 12/31/13 12/31/14  Tresa Garter, MD  diclofenac sodium (VOLTAREN) 1 % GEL Apply 2 g topically 4 (four) times daily. Patient not taking: Reported on 09/16/2014  08/18/13   Tresa Garter, MD  famotidine (PEPCID) 20 MG tablet Take 1 tablet (20 mg total) by mouth 2 (two) times daily. Patient not taking: Reported on 12/31/2014 09/16/14   April Palumbo, MD  glucose blood (ACCU-CHEK AVIVA) test strip Use as instructed Patient not taking: Reported on 12/31/2014 12/31/13   Tresa Garter, MD  ibuprofen (ADVIL,MOTRIN) 600 MG tablet Take 1 tablet (600 mg total) by mouth every 8 (eight) hours as needed. Patient not taking: Reported on 09/16/2014 09/15/13   Lorayne Marek, MD  ipratropium (ATROVENT) 0.06 % nasal spray Place 2 sprays into both nostrils 4 (four) times daily. Patient not taking: Reported on 09/16/2014 06/04/14   Gregor Hams, MD  Lancet Devices [facility]) lancets Use as instructed Patient not taking: Reported on 12/31/2014 12/31/13   Tresa Garter, MD  metFORMIN (GLUCOPHAGE) 1000 MG tablet Take 1 tablet (1,000 mg total) by mouth 2 (two) times daily with a meal. 12/31/14   Tresa Garter, MD  methocarbamol (ROBAXIN) 500 MG tablet  10/02/14   Historical Provider, MD  naproxen (NAPROSYN) 500 MG tablet Take 500 mg by mouth 2 (two) times daily with a meal.  08/28/14   Historical Provider, MD  neomycin-bacitracin-polymyxin (NEOSPORIN) [phone] ointment Apply topically 4 (four) times daily. 12/31/14   Tresa Garter, MD     Objective:   Filed Vitals:   12/31/14  1117  BP: 142/87  Pulse: 68  Temp: 98.1 F (36.7 C)  TempSrc: Oral  Resp: 16  Height: 5\' 9"  (1.753 m)  Weight: 224 lb (101.606 kg)  SpO2: 98%    Exam General appearance : Awake, alert, not in any distress. Speech Clear. Not toxic looking HEENT: Atraumatic and Normocephalic, pupils equally reactive to light and accomodation Neck: supple, no JVD. No cervical lymphadenopathy.  Chest:Good air entry bilaterally, no added sounds  CVS: S1 S2 regular, no murmurs.  Abdomen: Bowel sounds present, Non tender and not distended with no gaurding, rigidity or  rebound. Extremities: B/L Lower Ext shows no edema, both legs are warm to touch Neurology: Awake alert, and oriented X 3, CN II-XII intact, Non focal Skin:No Rash  Data Review Lab Results  Component Value Date   HGBA1C 10.8 12/31/2014   HGBA1C 9.8 12/31/2013     Assessment & Plan   1. Type 2 diabetes mellitus without complication  - Glucose (CBG) - HgB A1c has gone up to 10.8%  - COMPLETE METABOLIC PANEL WITH GFR - TSH - Microalbumin/Creatinine Ratio, Urine - Microalbumin, urine  - glipiZIDE (GLUCOTROL XL) 5 MG 24 hr tablet; Take 1 tablet (5 mg total) by mouth daily with breakfast.  Dispense: 90 tablet; Refill: 3 - metFORMIN (GLUCOPHAGE) 1000 MG tablet; Take 1 tablet (1,000 mg total) by mouth 2 (two) times daily with a meal.  Dispense: 180 tablet; Refill: 3  Aim for 30 minutes of exercise most days. Rethink what you drink. Water is great! Aim for 2-3 Carb Choices per meal (30-45 grams) +/- 1 either way  Aim for 0-15 Carbs per snack if hungry  Include protein in moderation with your meals and snacks  Consider reading food labels for Total Carbohydrate and Fat Grams of foods  Consider checking BG at alternate times per day  Continue taking medication as directed Be mindful about how much sugar you are adding to beverages and other foods. Fruit Punch - find one with no sugar  Measure and decrease portions of carbohydrate foods  Make your plate and don't go back for seconds  2. Multiple thyroid nodules  - US Soft Tissue Head/Neck; Future  3. Left shoulder pain  - Ambulatory referral to Orthopedic Surgery  4. Furunculosis of skin  - neomycin-bacitracin-polymyxin (NEOSPORIN) [phone] ointment; Apply topically 4 (four) times daily.  Dispense: 28.3 g; Refill: 0  5. Dyslipidemia  - Lipid panel - atorvastatin (LIPITOR) 20 MG tablet; Take 1 tablet (20 mg total) by mouth daily.  Dispense: 90 tablet; Refill: 3  To address this please limit saturated fat to no more  than 7% of your calories, limit cholesterol to 200 mg/day, increase fiber and exercise as tolerated. If needed we may add another cholesterol lowering medication to your regimen.   Patient have been counseled extensively about nutrition and exercise  Interpreter was used to communicate directly with patient for the entire encounter including providing detailed patient instructions.   Return in about 3 months (around 04/01/2015), or if symptoms worsen or fail to improve, for Hemoglobin A1C and Follow up, DM, Follow up HTN, Annual Physical, Follow up Pain and comorbidities.  The patient was given clear instructions to go to ER or return to medical center if symptoms don't improve, worsen or new problems develop. The patient verbalized understanding. The patient was told to call to get lab results if they haven't heard anything in the next week.   This note has been created with Museum/gallery curator  and smart Company secretary. Any transcriptional errors are unintentional.    Angelica Chessman, MD, [place], [place], [place], [place] and [place], [place]   12/31/2014, 12:12 PM

## 2014-12-31 NOTE — Patient Instructions (Signed)
Diabetes and Exercise Exercising regularly is important. It is not just about losing weight. It has many health benefits, such as:  Improving your overall fitness, flexibility, and endurance.  Increasing your bone density.  Helping with weight control.  Decreasing your body fat.  Increasing your muscle strength.  Reducing stress and tension.  Improving your overall health. People with diabetes who exercise gain additional benefits because exercise:  Reduces appetite.  Improves the body's use of blood sugar (glucose).  Helps lower or control blood glucose.  Decreases blood pressure.  Helps control blood lipids (such as cholesterol and triglycerides).  Improves the body's use of the hormone insulin by:  Increasing the body's insulin sensitivity.  Reducing the body's insulin needs.  Decreases the risk for heart disease because exercising:  Lowers cholesterol and triglycerides levels.  Increases the levels of good cholesterol (such as high-density lipoproteins [HDL]) in the body.  Lowers blood glucose levels. YOUR ACTIVITY PLAN  Choose an activity that you enjoy and set realistic goals. Your health care provider or diabetes educator can help you make an activity plan that works for you. Exercise regularly as directed by your health care provider. This includes:  Performing resistance training twice a week such as push-ups, sit-ups, lifting weights, or using resistance bands.  Performing 150 minutes of cardio exercises each week such as walking, running, or playing sports.  Staying active and spending no more than 90 minutes at one time being inactive. Even short bursts of exercise are good for you. Three 10-minute sessions spread throughout the day are just as beneficial as a single 30-minute session. Some exercise ideas include:  Taking the dog for a walk.  Taking the stairs instead of the elevator.  Dancing to your favorite song.  Doing an exercise  video.  Doing your favorite exercise with a friend. RECOMMENDATIONS FOR EXERCISING WITH TYPE 1 OR TYPE 2 DIABETES   Check your blood glucose before exercising. If blood glucose levels are greater than 240 mg/dL, check for urine ketones. Do not exercise if ketones are present.  Avoid injecting insulin into areas of the body that are going to be exercised. For example, avoid injecting insulin into:  The arms when playing tennis.  The legs when jogging.  Keep a record of:  Food intake before and after you exercise.  Expected peak times of insulin action.  Blood glucose levels before and after you exercise.  The type and amount of exercise you have done.  Review your records with your health care provider. Your health care provider will help you to develop guidelines for adjusting food intake and insulin amounts before and after exercising.  If you take insulin or oral hypoglycemic agents, watch for signs and symptoms of hypoglycemia. They include:  Dizziness.  Shaking.  Sweating.  Chills.  Confusion.  Drink plenty of water while you exercise to prevent dehydration or heat stroke. Body water is lost during exercise and must be replaced.  Talk to your health care provider before starting an exercise program to make sure it is safe for you. Remember, almost any type of activity is better than none. Document Released: 12/02/2003 Document Revised: 01/26/2014 Document Reviewed: 02/18/2013 ExitCare Patient Information 2015 [employer]. This information is not intended to replace advice given to you by your health care provider. Make sure you discuss any questions you have with your health care provider.  

## 2014-12-31 NOTE — Progress Notes (Signed)
Patient would like to review his DM meds He has a lump on his chest in between his breasts been there for 2 months and it is painful Had left shoulder surgery in 06/2014  Pain in his neck and head 8/10 and numbness in his hands Refuses tetanus shot Needs colonoscopy  A1c today 10.8

## 2014-12-31 NOTE — Telephone Encounter (Signed)
PATIENT DROPPED OFF THIS PAPERWORK, BUT WALKED AWAY BEFORE I COULD GET AN INTERPRETER. PAPERWORK IS IN [facility] BOX

## 2015-01-01 ENCOUNTER — Telehealth: Payer: Self-pay | Admitting: *Deleted

## 2015-01-01 DIAGNOSIS — L0292 Furuncle, unspecified: Secondary | ICD-10-CM

## 2015-01-01 DIAGNOSIS — E785 Hyperlipidemia, unspecified: Secondary | ICD-10-CM

## 2015-01-01 DIAGNOSIS — E119 Type 2 diabetes mellitus without complications: Secondary | ICD-10-CM

## 2015-01-01 LAB — MICROALBUMIN / CREATININE URINE RATIO
Creatinine, Urine: 251 mg/dL
Microalb Creat Ratio: 25.1 mg/g (ref 0.0–30.0)
Microalb, Ur: 6.3 mg/dL — ABNORMAL HIGH (ref <2.0)

## 2015-01-01 MED ORDER — METFORMIN HCL 1000 MG PO TABS: 1000.0000 mg | ORAL_TABLET | Freq: Two times a day (BID) | ORAL | Status: DC

## 2015-01-01 MED ORDER — GLIPIZIDE ER 5 MG PO TB24: 5.0000 mg | ORAL_TABLET | Freq: Every day | ORAL | Status: DC

## 2015-01-01 MED ORDER — ATORVASTATIN CALCIUM 20 MG PO TABS: 20.0000 mg | ORAL_TABLET | Freq: Every day | ORAL | Status: DC

## 2015-01-01 MED ORDER — GLUCOSE BLOOD VI STRP: ORAL_STRIP | Status: DC

## 2015-01-01 MED ORDER — ACCU-CHEK SOFTCLIX LANCET DEV MISC: Status: DC

## 2015-01-01 MED ORDER — TRIPLE ANTIBIOTIC 5-400-5000 EX OINT: TOPICAL_OINTMENT | Freq: Four times a day (QID) | CUTANEOUS | Status: DC

## 2015-01-01 NOTE — Telephone Encounter (Signed)
-----   Message from Tresa Garter, MD sent at 01/01/2015 12:52 PM EDT ----- Please inform patient that his laboratory test results are mostly within normal limit except for the cholesterol level is high, advise patient to continue medications as prescribed for cholesterol and also to address this please limit saturated fat to no more than 7% of your calories, limit cholesterol to 200 mg/day, increase fiber and exercise as tolerated. If needed we may add another cholesterol lowering medication to your regimen or increase the dose of your current medication.

## 2015-01-01 NOTE — Telephone Encounter (Signed)
Relayed test results to patient via [place] # [id number].  Patient states his medications prescribed while in the office yesterday were never sent to his pharmacy Geneticist, molecular at [place] [facility] [employer]).  I called the pharmacy and they verified Rx never sent.  I resent his Rx.

## 2015-01-05 ENCOUNTER — Telehealth: Payer: Self-pay | Admitting: *Deleted

## 2015-01-05 NOTE — Telephone Encounter (Signed)
Called and spoke with [facility] and gave her patient's medicaid's pre-certification approval #[id number] for his scheduled 01/14/15 CT head and neck

## 2015-01-14 ENCOUNTER — Ambulatory Visit (HOSPITAL_COMMUNITY)
Admission: RE | Admit: 2015-01-14 | Discharge: 2015-01-14 | Disposition: A | Discharge status: Home / Self Care | Payer: Medicaid Other | Source: Ambulatory Visit | Attending: Internal Medicine | Admitting: Internal Medicine

## 2015-01-14 ENCOUNTER — Other Ambulatory Visit: Payer: Self-pay | Admitting: Sports Medicine

## 2015-01-14 ENCOUNTER — Ambulatory Visit
Admission: RE | Admit: 2015-01-14 | Discharge: 2015-01-14 | Disposition: A | Discharge status: Home / Self Care | Payer: Medicaid Other | Source: Ambulatory Visit | Attending: Sports Medicine | Admitting: Sports Medicine

## 2015-01-14 DIAGNOSIS — M5441 Lumbago with sciatica, right side: Secondary | ICD-10-CM

## 2015-01-14 DIAGNOSIS — M25561 Pain in right knee: Secondary | ICD-10-CM

## 2015-01-14 DIAGNOSIS — E042 Nontoxic multinodular goiter: Secondary | ICD-10-CM | POA: Diagnosis not present

## 2015-01-14 DIAGNOSIS — M25562 Pain in left knee: Secondary | ICD-10-CM

## 2015-01-14 DIAGNOSIS — M5442 Lumbago with sciatica, left side: Secondary | ICD-10-CM

## 2015-01-15 ENCOUNTER — Other Ambulatory Visit: Payer: Self-pay | Admitting: Internal Medicine

## 2015-01-15 DIAGNOSIS — E041 Nontoxic single thyroid nodule: Secondary | ICD-10-CM

## 2015-01-18 ENCOUNTER — Encounter: Payer: Self-pay | Admitting: Sports Medicine

## 2015-01-18 ENCOUNTER — Telehealth: Payer: Self-pay

## 2015-01-18 ENCOUNTER — Ambulatory Visit (INDEPENDENT_AMBULATORY_CARE_PROVIDER_SITE_OTHER): Payer: Medicaid Other | Admitting: Sports Medicine

## 2015-01-18 VITALS — BP 122/74 | Ht 69.0 in | Wt 225.0 lb

## 2015-01-18 DIAGNOSIS — M5442 Lumbago with sciatica, left side: Secondary | ICD-10-CM | POA: Diagnosis not present

## 2015-01-18 DIAGNOSIS — M25561 Pain in right knee: Secondary | ICD-10-CM | POA: Diagnosis present

## 2015-01-18 DIAGNOSIS — M5441 Lumbago with sciatica, right side: Secondary | ICD-10-CM

## 2015-01-18 MED ORDER — DICLOFENAC SODIUM 1 % TD GEL: 2.0000 g | Freq: Four times a day (QID) | TRANSDERMAL | Status: DC

## 2015-01-18 NOTE — Telephone Encounter (Signed)
-----   Message from Tresa Garter, MD sent at 01/15/2015  3:44 PM EDT ----- Please inform patient that his neck ultrasound shows multinodular goiter, there is a single nodule on the left side that needs to be biopsied. We will refer patient to endocrinologist. Order has been placed

## 2015-01-18 NOTE — Progress Notes (Signed)
   Subjective:    Patient ID: Joe Wallace, male    DOB: [birth year], 54 y.o.   MRN: [id number]  HPI   Patient comes in today for follow-up on bilateral knee pain and low back pain. X-rays of each knee show some mild medial joint space narrowing bilaterally. Nothing acute. X-rays of his lumbar spine show some mild degenerative changes along with an incidental spina bifida occulta at L5. His knee pain is better after cortisone injections. He also finds the tramadol to be helpful. His low back pain persists. It is diffuse across his low back. No radiating pain into his legs. No associated numbness or tingling. No groin pain. Entire history is obtained through an interpreter. I also received and reviewed his records from [place]. These records pertain primarily to the left shoulder arthroscopy done by Dr. Tamera Punt in March 2015 for a partial rotator cuff tear, shoulder impingement, and ac DJD.      Review of Systems     Objective:   Physical Exam Well-developed, well-nourished. No acute distress.  Examination of each knee shows full range of motion. No effusion. There is slight tenderness to palpation along the medial joint lines bilaterally but a negative McMurray's. Good joint stability. No Bakers cyst. Neurovascularly intact distally.  Lumbar spine: Good lumbar range of motion. There is diffuse tenderness to palpation across the lower lumbar spine but nothing focal. No spasm. Negative straight leg raise bilaterally. No focal neurological deficit of either lower extremity.  X-rays of both knees and his lumbar spine are as above       Assessment & Plan:  Bilateral knee pain secondary to mild medial compartmental DJD Mechanical low back pain  His knees are feeling better after recent cortisone injections. I will refill his Voltaren gel for him to use on both his knees and his low back. I am also happy to refill his tramadol on an as-needed basis. I reassured him that I do  not believe he has any surgical pathology in his lumbar spine. I did briefly discuss the possibility of referral for a diagnostic arthroscopy of his knees if his symptoms return. Follow-up as needed.

## 2015-01-18 NOTE — Telephone Encounter (Signed)
Interpreter line used ID [id number] Patient is aware of his ultra sound results and that the specialist office Will be contacting him for the appointment

## 2015-01-19 ENCOUNTER — Encounter: Payer: Self-pay | Admitting: Sports Medicine

## 2015-02-11 ENCOUNTER — Ambulatory Visit: Payer: Medicaid Other | Attending: Internal Medicine | Admitting: Internal Medicine

## 2015-02-11 ENCOUNTER — Encounter: Payer: Self-pay | Admitting: Internal Medicine

## 2015-02-11 VITALS — BP 127/81 | HR 73 | Temp 98.0°F | Resp 16 | Wt 228.8 lb

## 2015-02-11 DIAGNOSIS — M25512 Pain in left shoulder: Secondary | ICD-10-CM | POA: Diagnosis not present

## 2015-02-11 DIAGNOSIS — E785 Hyperlipidemia, unspecified: Secondary | ICD-10-CM | POA: Insufficient documentation

## 2015-02-11 DIAGNOSIS — E042 Nontoxic multinodular goiter: Secondary | ICD-10-CM

## 2015-02-11 DIAGNOSIS — E119 Type 2 diabetes mellitus without complications: Secondary | ICD-10-CM | POA: Diagnosis not present

## 2015-02-11 MED ORDER — GLIPIZIDE ER 5 MG PO TB24: 5.0000 mg | ORAL_TABLET | Freq: Every day | ORAL | Status: DC

## 2015-02-11 MED ORDER — METFORMIN HCL 1000 MG PO TABS: 1000.0000 mg | ORAL_TABLET | Freq: Two times a day (BID) | ORAL | Status: DC

## 2015-02-11 MED ORDER — ATORVASTATIN CALCIUM 20 MG PO TABS: 20.0000 mg | ORAL_TABLET | Freq: Every day | ORAL | Status: DC

## 2015-02-11 NOTE — Progress Notes (Signed)
Patient ID: Joe Wallace, male   DOB: 1961-03-06, 54 y.o.   MRN: [id number]   Joe Wallace, is a 54 y.o. male  KZS:[id number]  [id number]  DOB - 23-Aug-1961  Chief Complaint  Patient presents with  . Follow-up        Subjective:   Joe Wallace is a 54 y.o. male here today for a follow up visit.  Patient has history of diabetes without complications. He is here to review results of his thyroid ultrasound which showed multinodular goiters with a left-sided dominant nodule. Patient also needs medication refill for his diabetes. He claims compliant with his medications. He reports no side effects, no hypoglycemic episode. Patient has No headache, No chest pain, No abdominal pain - No Nausea, No new weakness tingling or numbness, No Cough - SOB.  No problems updated.  ALLERGIES: No Known Allergies  PAST MEDICAL HISTORY: Past Medical History  Diagnosis Date  . Diabetes mellitus without complication     MEDICATIONS AT HOME: Prior to Admission medications   Medication Sig Start Date End Date Taking? Authorizing Provider  ACCU-CHEK SOFTCLIX LANCETS lancets  01/01/15   Historical Provider, MD  acetaminophen-codeine (TYLENOL #3) 300-30 MG per tablet TAKE 1 TABLET BY MOUTH EVERY 4 HOURS AS NEEDED    Amauris Debois E Deshawn Skelley, MD  atorvastatin (LIPITOR) 20 MG tablet Take 1 tablet (20 mg total) by mouth daily. 02/11/15   Tresa Garter, MD  diclofenac sodium (VOLTAREN) 1 % GEL Apply 2 g topically 4 (four) times daily. Patient not taking: Reported on 09/16/2014 08/18/13   Tresa Garter, MD  famotidine (PEPCID) 20 MG tablet Take 1 tablet (20 mg total) by mouth 2 (two) times daily. Patient not taking: Reported on 12/31/2014 09/16/14   April Palumbo, MD  glipiZIDE (GLUCOTROL XL) 5 MG 24 hr tablet Take 1 tablet (5 mg total) by mouth daily with breakfast. 02/11/15   Tresa Garter, MD  glucose blood (ACCU-CHEK AVIVA) test strip Use as instructed 01/01/15   Tresa Garter, MD    ibuprofen (ADVIL,MOTRIN) 600 MG tablet Take 1 tablet (600 mg total) by mouth every 8 (eight) hours as needed. Patient not taking: Reported on 09/16/2014 09/15/13   Lorayne Marek, MD  ipratropium (ATROVENT) 0.06 % nasal spray Place 2 sprays into both nostrils 4 (four) times daily. Patient not taking: Reported on 09/16/2014 06/04/14   Gregor Hams, MD  Lancet Devices (ACCU-CHEK [facility]) lancets Use as instructed 01/01/15   Tresa Garter, MD  metFORMIN (GLUCOPHAGE) 1000 MG tablet Take 1 tablet (1,000 mg total) by mouth 2 (two) times daily with a meal. 02/11/15   Tresa Garter, MD  methocarbamol (ROBAXIN) 500 MG tablet  10/02/14   Historical Provider, MD  naproxen (NAPROSYN) 500 MG tablet Take 500 mg by mouth 2 (two) times daily with a meal.  08/28/14   Historical Provider, MD  neomycin-bacitracin-polymyxin (NEOSPORIN) [id number] ointment Apply topically 4 (four) times daily. 01/01/15   Tresa Garter, MD  traMADol (ULTRAM) 50 MG tablet Take 1 tablet twice a day as needed for pain 12/09/14   Thurman Coyer, DO     Objective:   Filed Vitals:   02/11/15 1647  BP: 127/81  Pulse: 73  Temp: 98 F (36.7 C)  Resp: 16  Weight: 228 lb 12.8 oz (103.783 kg)  SpO2: 100%    Exam General appearance : Awake, alert, not in any distress. Speech Clear. Not toxic looking HEENT: Atraumatic and Normocephalic, pupils equally reactive to light  and accomodation Neck: supple, no JVD. No cervical lymphadenopathy.  Chest:Good air entry bilaterally, no added sounds  CVS: S1 S2 regular, no murmurs.  Abdomen: Bowel sounds present, Non tender and not distended with no gaurding, rigidity or rebound. Extremities: B/L Lower Ext shows no edema, both legs are warm to touch Neurology: Awake alert, and oriented X 3, CN II-XII intact, Non focal Skin:No Rash  Data Review Lab Results  Component Value Date   HGBA1C 10.8 12/31/2014   HGBA1C 9.8 12/31/2013     Assessment & Plan   1. Dyslipidemia  -  atorvastatin (LIPITOR) 20 MG tablet; Take 1 tablet (20 mg total) by mouth daily.  Dispense: 90 tablet; Refill: 3  To address this please limit saturated fat to no more than 7% of your calories, limit cholesterol to 200 mg/day, increase fiber and exercise as tolerated. If needed we may add another cholesterol lowering medication to your regimen.   2. Type 2 diabetes mellitus without complication  - glipiZIDE (GLUCOTROL XL) 5 MG 24 hr tablet; Take 1 tablet (5 mg total) by mouth daily with breakfast.  Dispense: 90 tablet; Refill: 3 - metFORMIN (GLUCOPHAGE) 1000 MG tablet; Take 1 tablet (1,000 mg total) by mouth 2 (two) times daily with a meal.  Dispense: 180 tablet; Refill: 3  Aim for 2-3 Carb Choices per meal (30-45 grams) +/- 1 either way  Aim for 0-15 Carbs per snack if hungry  Include protein in moderation with your meals and snacks  Consider reading food labels for Total Carbohydrate and Fat Grams of foods  Consider checking BG at alternate times per day  Continue taking medication as directed Fruit Punch - find one with no sugar  Measure and decrease portions of carbohydrate foods  Make your plate and don't go back for seconds   3. Left shoulder pain  Appointment with Sport medicine and PT coming up next week Continue pain medications  4. Multiple thyroid nodules  I have reviewed the ultrasound soft tissue head and neck and discussed the result with patient. Patient has appointment with endocrinologist in Sept. 2016 for biopsy   Patient have been counseled extensively about nutrition and exercise  Interpreter was used to communicate directly with patient for the entire encounter including providing detailed patient instructions.   Return in about 2 months (around 04/13/2015) for Hemoglobin A1C and Follow up, DM, Follow up HTN.  The patient was given clear instructions to go to ER or return to medical center if symptoms don't improve, worsen or new problems develop. The patient  verbalized understanding. The patient was told to call to get lab results if they haven't heard anything in the next week.   This note has been created with Surveyor, quantity. Any transcriptional errors are unintentional.    Angelica Chessman, MD, [place], [place], [place], [place] and [place], [place]   02/11/2015, 5:23 PM

## 2015-02-11 NOTE — Patient Instructions (Signed)
Basic Carbohydrate Counting for Diabetes Mellitus Carbohydrate counting is a method for keeping track of the amount of carbohydrates you eat. Eating carbohydrates naturally increases the level of sugar (glucose) in your blood, so it is important for you to know the amount that is okay for you to have in every meal. Carbohydrate counting helps keep the level of glucose in your blood within normal limits. The amount of carbohydrates allowed is different for every person. A dietitian can help you calculate the amount that is right for you. Once you know the amount of carbohydrates you can have, you can count the carbohydrates in the foods you want to eat. Carbohydrates are found in the following foods:  Grains, such as breads and cereals.  Dried beans and soy products.  Starchy vegetables, such as potatoes, peas, and corn.  Fruit and fruit juices.  Milk and yogurt.  Sweets and snack foods, such as cake, cookies, candy, chips, soft drinks, and fruit drinks. CARBOHYDRATE COUNTING There are two ways to count the carbohydrates in your food. You can use either of the methods or a combination of both. Reading the "Nutrition Facts" on Packaged Food The "Nutrition Facts" is an area that is included on the labels of almost all packaged food and beverages in the [place]. It includes the serving size of that food or beverage and information about the nutrients in each serving of the food, including the grams (g) of carbohydrate per serving.  Decide the number of servings of this food or beverage that you will be able to eat or drink. Multiply that number of servings by the number of grams of carbohydrate that is listed on the label for that serving. The total will be the amount of carbohydrates you will be having when you eat or drink this food or beverage. Learning Standard Serving Sizes of Food When you eat food that is not packaged or does not include "Nutrition Facts" on the label, you need to  measure the servings in order to count the amount of carbohydrates.A serving of most carbohydrate-rich foods contains about 15 g of carbohydrates. The following list includes serving sizes of carbohydrate-rich foods that provide 15 g ofcarbohydrate per serving:   1 slice of bread (1 oz) or 1 six-inch tortilla.    of a hamburger bun or English muffin.  4-6 crackers.   cup unsweetened dry cereal.    cup hot cereal.   cup rice or pasta.    cup mashed potatoes or  of a large baked potato.  1 cup fresh fruit or one small piece of fruit.    cup canned or frozen fruit or fruit juice.  1 cup milk.   cup plain fat-free yogurt or yogurt sweetened with artificial sweeteners.   cup cooked dried beans or starchy vegetable, such as peas, corn, or potatoes.  Decide the number of standard-size servings that you will eat. Multiply that number of servings by 15 (the grams of carbohydrates in that serving). For example, if you eat 2 cups of strawberries, you will have eaten 2 servings and 30 g of carbohydrates (2 servings x 15 g = 30 g). For foods such as soups and casseroles, in which more than one food is mixed in, you will need to count the carbohydrates in each food that is included. EXAMPLE OF CARBOHYDRATE COUNTING Sample Dinner  3 oz chicken breast.   cup of brown rice.   cup of corn.  1 cup milk.   1 cup strawberries with   sugar-free whipped topping.  Carbohydrate Calculation Step 1: Identify the foods that contain carbohydrates:   Rice.   Corn.   Milk.   Strawberries. Step 2:Calculate the number of servings eaten of each:   2 servings of rice.   1 serving of corn.   1 serving of milk.   1 serving of strawberries. Step 3: Multiply each of those number of servings by 15 g:   2 servings of rice x 15 g = 30 g.   1 serving of corn x 15 g = 15 g.   1 serving of milk x 15 g = 15 g.   1 serving of strawberries x 15 g = 15 g. Step 4: Add  together all of the amounts to find the total grams of carbohydrates eaten: 30 g + 15 g + 15 g + 15 g = 75 g. Document Released: 09/11/2005 Document Revised: 01/26/2014 Document Reviewed: 08/08/2013 ExitCare Patient Information 2015 [employer]. This information is not intended to replace advice given to you by your health care provider. Make sure you discuss any questions you have with your health care provider. Diabetes and Exercise Exercising regularly is important. It is not just about losing weight. It has many health benefits, such as:  Improving your overall fitness, flexibility, and endurance.  Increasing your bone density.  Helping with weight control.  Decreasing your body fat.  Increasing your muscle strength.  Reducing stress and tension.  Improving your overall health. People with diabetes who exercise gain additional benefits because exercise:  Reduces appetite.  Improves the body's use of blood sugar (glucose).  Helps lower or control blood glucose.  Decreases blood pressure.  Helps control blood lipids (such as cholesterol and triglycerides).  Improves the body's use of the hormone insulin by:  Increasing the body's insulin sensitivity.  Reducing the body's insulin needs.  Decreases the risk for heart disease because exercising:  Lowers cholesterol and triglycerides levels.  Increases the levels of good cholesterol (such as high-density lipoproteins [HDL]) in the body.  Lowers blood glucose levels. YOUR ACTIVITY PLAN  Choose an activity that you enjoy and set realistic goals. Your health care provider or diabetes educator can help you make an activity plan that works for you. Exercise regularly as directed by your health care provider. This includes:  Performing resistance training twice a week such as push-ups, sit-ups, lifting weights, or using resistance bands.  Performing 150 minutes of cardio exercises each week such as walking, running, or  playing sports.  Staying active and spending no more than 90 minutes at one time being inactive. Even short bursts of exercise are good for you. Three 10-minute sessions spread throughout the day are just as beneficial as a single 30-minute session. Some exercise ideas include:  Taking the dog for a walk.  Taking the stairs instead of the elevator.  Dancing to your favorite song.  Doing an exercise video.  Doing your favorite exercise with a friend. RECOMMENDATIONS FOR EXERCISING WITH TYPE 1 OR TYPE 2 DIABETES   Check your blood glucose before exercising. If blood glucose levels are greater than 240 mg/dL, check for urine ketones. Do not exercise if ketones are present.  Avoid injecting insulin into areas of the body that are going to be exercised. For example, avoid injecting insulin into:  The arms when playing tennis.  The legs when jogging.  Keep a record of:  Food intake before and after you exercise.  Expected peak times of insulin action.  Blood   glucose levels before and after you exercise.  The type and amount of exercise you have done.  Review your records with your health care provider. Your health care provider will help you to develop guidelines for adjusting food intake and insulin amounts before and after exercising.  If you take insulin or oral hypoglycemic agents, watch for signs and symptoms of hypoglycemia. They include:  Dizziness.  Shaking.  Sweating.  Chills.  Confusion.  Drink plenty of water while you exercise to prevent dehydration or heat stroke. Body water is lost during exercise and must be replaced.  Talk to your health care provider before starting an exercise program to make sure it is safe for you. Remember, almost any type of activity is better than none. Document Released: 12/02/2003 Document Revised: 01/26/2014 Document Reviewed: 02/18/2013 ExitCare Patient Information 2015 [employer]. This information is not intended to  replace advice given to you by your health care provider. Make sure you discuss any questions you have with your health care provider.  

## 2015-02-11 NOTE — Progress Notes (Signed)
Patient here with interpreter Patient states he is here for follow up Complains of left side shoulder pain Patient also states he had an x ray of his thyroid and would like those  Results as well

## 2015-02-17 ENCOUNTER — Telehealth: Payer: Self-pay | Admitting: Internal Medicine

## 2015-02-17 NOTE — Telephone Encounter (Signed)
Patient came into office requesting referral to [facility] [place] [facility]. Patient states he has been seen there before and would like to follow up with them Please f/u with patient

## 2015-03-02 ENCOUNTER — Other Ambulatory Visit: Payer: Self-pay | Admitting: Internal Medicine

## 2015-03-02 DIAGNOSIS — M25562 Pain in left knee: Principal | ICD-10-CM

## 2015-03-02 DIAGNOSIS — M25561 Pain in right knee: Secondary | ICD-10-CM

## 2015-06-07 ENCOUNTER — Ambulatory Visit: Payer: Medicaid Other | Attending: Internal Medicine | Admitting: Internal Medicine

## 2015-06-07 ENCOUNTER — Encounter: Payer: Self-pay | Admitting: Internal Medicine

## 2015-06-07 VITALS — BP 113/73 | HR 71 | Temp 97.9°F | Resp 18 | Ht 69.0 in | Wt 223.0 lb

## 2015-06-07 DIAGNOSIS — Z79899 Other long term (current) drug therapy: Secondary | ICD-10-CM | POA: Insufficient documentation

## 2015-06-07 DIAGNOSIS — M25512 Pain in left shoulder: Secondary | ICD-10-CM

## 2015-06-07 DIAGNOSIS — E118 Type 2 diabetes mellitus with unspecified complications: Secondary | ICD-10-CM | POA: Diagnosis not present

## 2015-06-07 DIAGNOSIS — M7502 Adhesive capsulitis of left shoulder: Secondary | ICD-10-CM

## 2015-06-07 DIAGNOSIS — M542 Cervicalgia: Secondary | ICD-10-CM | POA: Diagnosis present

## 2015-06-07 LAB — GLUCOSE, POCT (MANUAL RESULT ENTRY): POC Glucose: 289 mg/dl — AB (ref 70–99)

## 2015-06-07 LAB — POCT GLYCOSYLATED HEMOGLOBIN (HGB A1C): HEMOGLOBIN A1C: 11

## 2015-06-07 LAB — MICROALBUMIN, URINE: Microalb, Ur: 3.5 mg/dL — ABNORMAL HIGH (ref <2.0)

## 2015-06-07 MED ORDER — INSULIN ASPART 100 UNIT/ML ~~LOC~~ SOLN
10.0000 [IU] | Freq: Once | SUBCUTANEOUS | Status: AC
  Administered 2015-06-07: 10 [IU] via SUBCUTANEOUS

## 2015-06-07 MED ORDER — TRAMADOL HCL 50 MG PO TABS: ORAL_TABLET | ORAL | Status: DC

## 2015-06-07 MED ORDER — GLIPIZIDE ER 10 MG PO TB24: 10.0000 mg | ORAL_TABLET | Freq: Every day | ORAL | Status: DC

## 2015-06-07 NOTE — Progress Notes (Signed)
Patient here for right and left neck, and right shoulder pain, currently at level 7, described as numbness. Patient reports having left shoulder surgery in 07/21/14, has been experiencing neck pain since. Patient wants referral to [facility]. Patient reports taking no medication today.   Current CBG 289, patient reports nothing to eat or drink today. Patient has not taken diabetes medications today.

## 2015-06-07 NOTE — Patient Instructions (Signed)
Diabetes and Exercise Exercising regularly is important. It is not just about losing weight. It has many health benefits, such as:  Improving your overall fitness, flexibility, and endurance.  Increasing your bone density.  Helping with weight control.  Decreasing your body fat.  Increasing your muscle strength.  Reducing stress and tension.  Improving your overall health. People with diabetes who exercise gain additional benefits because exercise:  Reduces appetite.  Improves the body's use of blood sugar (glucose).  Helps lower or control blood glucose.  Decreases blood pressure.  Helps control blood lipids (such as cholesterol and triglycerides).  Improves the body's use of the hormone insulin by:  Increasing the body's insulin sensitivity.  Reducing the body's insulin needs.  Decreases the risk for heart disease because exercising:  Lowers cholesterol and triglycerides levels.  Increases the levels of good cholesterol (such as high-density lipoproteins [HDL]) in the body.  Lowers blood glucose levels. YOUR ACTIVITY PLAN  Choose an activity that you enjoy and set realistic goals. Your health care provider or diabetes educator can help you make an activity plan that works for you. Exercise regularly as directed by your health care provider. This includes:  Performing resistance training twice a week such as push-ups, sit-ups, lifting weights, or using resistance bands.  Performing 150 minutes of cardio exercises each week such as walking, running, or playing sports.  Staying active and spending no more than 90 minutes at one time being inactive. Even short bursts of exercise are good for you. Three 10-minute sessions spread throughout the day are just as beneficial as a single 30-minute session. Some exercise ideas include:  Taking the dog for a walk.  Taking the stairs instead of the elevator.  Dancing to your favorite song.  Doing an exercise  video.  Doing your favorite exercise with a friend. RECOMMENDATIONS FOR EXERCISING WITH TYPE 1 OR TYPE 2 DIABETES   Check your blood glucose before exercising. If blood glucose levels are greater than 240 mg/dL, check for urine ketones. Do not exercise if ketones are present.  Avoid injecting insulin into areas of the body that are going to be exercised. For example, avoid injecting insulin into:  The arms when playing tennis.  The legs when jogging.  Keep a record of:  Food intake before and after you exercise.  Expected peak times of insulin action.  Blood glucose levels before and after you exercise.  The type and amount of exercise you have done.  Review your records with your health care provider. Your health care provider will help you to develop guidelines for adjusting food intake and insulin amounts before and after exercising.  If you take insulin or oral hypoglycemic agents, watch for signs and symptoms of hypoglycemia. They include:  Dizziness.  Shaking.  Sweating.  Chills.  Confusion.  Drink plenty of water while you exercise to prevent dehydration or heat stroke. Body water is lost during exercise and must be replaced.  Talk to your health care provider before starting an exercise program to make sure it is safe for you. Remember, almost any type of activity is better than none. Document Released: 12/02/2003 Document Revised: 01/26/2014 Document Reviewed: 02/18/2013 ExitCare Patient Information 2015 [employer]. This information is not intended to replace advice given to you by your health care provider. Make sure you discuss any questions you have with your health care provider. Basic Carbohydrate Counting for Diabetes Mellitus Carbohydrate counting is a method for keeping track of the amount of carbohydrates you eat.   Eating carbohydrates naturally increases the level of sugar (glucose) in your blood, so it is important for you to know the amount that is  okay for you to have in every meal. Carbohydrate counting helps keep the level of glucose in your blood within normal limits. The amount of carbohydrates allowed is different for every person. A dietitian can help you calculate the amount that is right for you. Once you know the amount of carbohydrates you can have, you can count the carbohydrates in the foods you want to eat. Carbohydrates are found in the following foods:  Grains, such as breads and cereals.  Dried beans and soy products.  Starchy vegetables, such as potatoes, peas, and corn.  Fruit and fruit juices.  Milk and yogurt.  Sweets and snack foods, such as cake, cookies, candy, chips, soft drinks, and fruit drinks. CARBOHYDRATE COUNTING There are two ways to count the carbohydrates in your food. You can use either of the methods or a combination of both. Reading the "Nutrition Facts" on Packaged Food The "Nutrition Facts" is an area that is included on the labels of almost all packaged food and beverages in the [place]. It includes the serving size of that food or beverage and information about the nutrients in each serving of the food, including the grams (g) of carbohydrate per serving.  Decide the number of servings of this food or beverage that you will be able to eat or drink. Multiply that number of servings by the number of grams of carbohydrate that is listed on the label for that serving. The total will be the amount of carbohydrates you will be having when you eat or drink this food or beverage. Learning Standard Serving Sizes of Food When you eat food that is not packaged or does not include "Nutrition Facts" on the label, you need to measure the servings in order to count the amount of carbohydrates.A serving of most carbohydrate-rich foods contains about 15 g of carbohydrates. The following list includes serving sizes of carbohydrate-rich foods that provide 15 g ofcarbohydrate per serving:   1 slice of bread  (1 oz) or 1 six-inch tortilla.    of a hamburger bun or English muffin.  4-6 crackers.   cup unsweetened dry cereal.    cup hot cereal.   cup rice or pasta.    cup mashed potatoes or  of a large baked potato.  1 cup fresh fruit or one small piece of fruit.    cup canned or frozen fruit or fruit juice.  1 cup milk.   cup plain fat-free yogurt or yogurt sweetened with artificial sweeteners.   cup cooked dried beans or starchy vegetable, such as peas, corn, or potatoes.  Decide the number of standard-size servings that you will eat. Multiply that number of servings by 15 (the grams of carbohydrates in that serving). For example, if you eat 2 cups of strawberries, you will have eaten 2 servings and 30 g of carbohydrates (2 servings x 15 g = 30 g). For foods such as soups and casseroles, in which more than one food is mixed in, you will need to count the carbohydrates in each food that is included. EXAMPLE OF CARBOHYDRATE COUNTING Sample Dinner  3 oz chicken breast.   cup of brown rice.   cup of corn.  1 cup milk.   1 cup strawberries with sugar-free whipped topping.  Carbohydrate Calculation Step 1: Identify the foods that contain carbohydrates:   Rice.   Corn.     Milk.   Strawberries. Step 2:Calculate the number of servings eaten of each:   2 servings of rice.   1 serving of corn.   1 serving of milk.   1 serving of strawberries. Step 3: Multiply each of those number of servings by 15 g:   2 servings of rice x 15 g = 30 g.   1 serving of corn x 15 g = 15 g.   1 serving of milk x 15 g = 15 g.   1 serving of strawberries x 15 g = 15 g. Step 4: Add together all of the amounts to find the total grams of carbohydrates eaten: 30 g + 15 g + 15 g + 15 g = 75 g. Document Released: 09/11/2005 Document Revised: 01/26/2014 Document Reviewed: 08/08/2013 ExitCare Patient Information 2015 [employer]. This information is not intended to  replace advice given to you by your health care provider. Make sure you discuss any questions you have with your health care provider.  

## 2015-06-07 NOTE — Progress Notes (Signed)
Patient ID: Joe Wallace, male   DOB: 06-23-1961, 54 y.o.   MRN: [id number]   Joe Wallace, is a 54 y.o. male  OEU:[id number]  XVQ:[id number]  DOB - Feb 21, 1961  Chief Complaint  Patient presents with  . Neck Pain        Subjective:   Joe Wallace is a 54 y.o. male with history of diabetes mellitus here today for a follow up visit. Patient here for right and left neck, and right shoulder pain, currently at level 7, described as numbness. Patient reports having left shoulder surgery in 07/21/14, has been experiencing neck pain since then. Patient wants referral to Dr. Raliegh Ip for further evaluation and management. Current CBG 289, patient reports nothing to eat or drink today. Patient has not taken diabetes medications today but claims compliant with medications, reports no hypoglycemic episode. Patient has No headache, No chest pain, No abdominal pain - No Nausea, No new weakness tingling or numbness, No Cough - SOB.  Problem  Adhesive Capsulitis of Left Shoulder   ALLERGIES: No Known Allergies  PAST MEDICAL HISTORY: Past Medical History  Diagnosis Date  . Diabetes mellitus without complication     MEDICATIONS AT HOME: Prior to Admission medications   Medication Sig Start Date End Date Taking? Authorizing Provider  ACCU-CHEK SOFTCLIX LANCETS lancets  01/01/15  Yes Historical Provider, MD  acetaminophen-codeine (TYLENOL #3) 300-30 MG per tablet TAKE 1 TABLET BY MOUTH EVERY 4 HOURS AS NEEDED   Yes Allenmichael Mcpartlin E Melane Windholz, MD  atorvastatin (LIPITOR) 20 MG tablet Take 1 tablet (20 mg total) by mouth daily. 02/11/15  Yes Tresa Garter, MD  glipiZIDE (GLUCOTROL XL) 10 MG 24 hr tablet Take 1 tablet (10 mg total) by mouth daily with breakfast. 06/07/15  Yes Tresa Garter, MD  glucose blood (ACCU-CHEK AVIVA) test strip Use as instructed 01/01/15  Yes Tresa Garter, MD  Lancet Devices [facility]) lancets Use as instructed 01/01/15  Yes Tresa Garter, MD    metFORMIN (GLUCOPHAGE) 1000 MG tablet Take 1 tablet (1,000 mg total) by mouth 2 (two) times daily with a meal. 02/11/15  Yes Felicite Zeimet E Doreene Burke, MD  diclofenac sodium (VOLTAREN) 1 % GEL Apply 2 g topically 4 (four) times daily. Patient not taking: Reported on 09/16/2014 08/18/13   Tresa Garter, MD  famotidine (PEPCID) 20 MG tablet Take 1 tablet (20 mg total) by mouth 2 (two) times daily. Patient not taking: Reported on 12/31/2014 09/16/14   April Palumbo, MD  ibuprofen (ADVIL,MOTRIN) 600 MG tablet Take 1 tablet (600 mg total) by mouth every 8 (eight) hours as needed. Patient not taking: Reported on 09/16/2014 09/15/13   Lorayne Marek, MD  ipratropium (ATROVENT) 0.06 % nasal spray Place 2 sprays into both nostrils 4 (four) times daily. Patient not taking: Reported on 09/16/2014 06/04/14   Gregor Hams, MD  methocarbamol (ROBAXIN) 500 MG tablet  10/02/14   Historical Provider, MD  naproxen (NAPROSYN) 500 MG tablet Take 500 mg by mouth 2 (two) times daily with a meal.  08/28/14   Historical Provider, MD  neomycin-bacitracin-polymyxin (NEOSPORIN) [phone] ointment Apply topically 4 (four) times daily. 01/01/15   Tresa Garter, MD  traMADol (ULTRAM) 50 MG tablet Take 1 tablet twice a day as needed for pain 06/07/15   Tresa Garter, MD   Objective:   Filed Vitals:   06/07/15 0944  BP: 113/73  Pulse: 71  Temp: 97.9 F (36.6 C)  TempSrc: Oral  Resp: 18  Height: 5'  9" (1.753 m)  Weight: 223 lb (101.152 kg)  SpO2: 96%   Exam General appearance : Awake, alert, not in any distress. Speech Clear. Not toxic looking HEENT: Atraumatic and Normocephalic, pupils equally reactive to light and accomodation Neck: supple, no JVD. No cervical lymphadenopathy.  Chest:Good air entry bilaterally, no added sounds  CVS: S1 S2 regular, no murmurs.  Abdomen: Bowel sounds present, Non tender and not distended with no gaurding, rigidity or rebound. Extremities: B/L Lower Ext shows no edema, both  legs are warm to touch Neurology: Awake alert, and oriented X 3, CN II-XII intact, Non focal Skin: No Rash  Data Review Lab Results  Component Value Date   HGBA1C 11.0 06/07/2015   HGBA1C 10.8 12/31/2014   HGBA1C 9.8 12/31/2013     Assessment & Plan   1. Type 2 diabetes mellitus with complications  - Glucose (CBG) - HgB A1c is 11% today, BG uncontrolled - Microalbumin, urine - insulin aspart (novoLOG) injection 10 Units; Inject 0.1 mLs (10 Units total) into the skin once.  Increase - glipiZIDE (GLUCOTROL XL) 10 MG 24 hr tablet; Take 1 tablet (10 mg total) by mouth daily with breakfast.  Dispense: 90 tablet; Refill: 3  Aim for 30 minutes of exercise most days. Rethink what you drink. Water is great! Aim for 2-3 Carb Choices per meal (30-45 grams) +/- 1 either way  Aim for 0-15 Carbs per snack if hungry  Include protein in moderation with your meals and snacks  Consider reading food labels for Total Carbohydrate and Fat Grams of foods  Consider checking BG at alternate times per day  Continue taking medication as directed Be mindful about how much sugar you are adding to beverages and other foods. Fruit Punch - find one with no sugar  Measure and decrease portions of carbohydrate foods  Make your plate and don't go back for seconds  2. Left shoulder pain  - Ambulatory referral to Orthopedic Surgery: Dr. Percell Miller  - traMADol (ULTRAM) 50 MG tablet; Take 1 tablet twice a day as needed for pain  Dispense: 60 tablet; Refill: 1  3. Adhesive capsulitis of left shoulder  - Ambulatory referral to Orthopedic Surgery  4. Morbid obesity Patient have been counseled extensively about nutrition and exercise  Return in about 3 months (around 09/06/2015) for Hemoglobin A1C and Follow up, DM, Follow up Pain and comorbidities.   Interpreter was used to communicate directly with patient for the entire encounter including providing detailed patient instructions.   The patient was given  clear instructions to go to ER or return to medical center if symptoms don't improve, worsen or new problems develop. The patient verbalized understanding. The patient was told to call to get lab results if they haven't heard anything in the next week.   This note has been created with Surveyor, quantity. Any transcriptional errors are unintentional.    Angelica Chessman, MD, [place], Karilyn Cota, [place], Dewy Rose   06/07/2015, 10:41 AM

## 2015-06-16 ENCOUNTER — Other Ambulatory Visit: Payer: Self-pay | Admitting: Internal Medicine

## 2015-06-16 DIAGNOSIS — E041 Nontoxic single thyroid nodule: Secondary | ICD-10-CM

## 2015-06-23 ENCOUNTER — Other Ambulatory Visit (HOSPITAL_COMMUNITY)
Admission: RE | Admit: 2015-06-23 | Discharge: 2015-06-23 | Disposition: A | Discharge status: Home / Self Care | Payer: Medicaid Other | Source: Ambulatory Visit | Attending: Radiology | Admitting: Radiology

## 2015-06-23 ENCOUNTER — Ambulatory Visit
Admission: RE | Admit: 2015-06-23 | Discharge: 2015-06-23 | Disposition: A | Discharge status: Home / Self Care | Payer: Medicaid Other | Source: Ambulatory Visit | Attending: Internal Medicine | Admitting: Internal Medicine

## 2015-06-23 DIAGNOSIS — E041 Nontoxic single thyroid nodule: Secondary | ICD-10-CM | POA: Insufficient documentation

## 2015-07-01 ENCOUNTER — Inpatient Hospital Stay: Admission: RE | Admit: 2015-07-01 | Payer: Medicaid Other | Source: Ambulatory Visit

## 2015-07-12 ENCOUNTER — Ambulatory Visit: Payer: Self-pay | Admitting: Surgery

## 2015-07-23 ENCOUNTER — Emergency Department (HOSPITAL_COMMUNITY)
Admission: EM | Admit: 2015-07-23 | Discharge: 2015-07-23 | Disposition: A | Discharge status: Home / Self Care | Payer: Medicaid Other | Attending: Emergency Medicine | Admitting: Emergency Medicine

## 2015-07-23 ENCOUNTER — Encounter (HOSPITAL_COMMUNITY): Payer: Self-pay | Admitting: Cardiology

## 2015-07-23 ENCOUNTER — Emergency Department (HOSPITAL_COMMUNITY): Payer: Medicaid Other

## 2015-07-23 DIAGNOSIS — E119 Type 2 diabetes mellitus without complications: Secondary | ICD-10-CM | POA: Diagnosis not present

## 2015-07-23 DIAGNOSIS — Z791 Long term (current) use of non-steroidal anti-inflammatories (NSAID): Secondary | ICD-10-CM | POA: Diagnosis not present

## 2015-07-23 DIAGNOSIS — Z7984 Long term (current) use of oral hypoglycemic drugs: Secondary | ICD-10-CM | POA: Diagnosis not present

## 2015-07-23 DIAGNOSIS — Z79899 Other long term (current) drug therapy: Secondary | ICD-10-CM | POA: Diagnosis not present

## 2015-07-23 DIAGNOSIS — J159 Unspecified bacterial pneumonia: Secondary | ICD-10-CM | POA: Diagnosis not present

## 2015-07-23 DIAGNOSIS — R05 Cough: Secondary | ICD-10-CM | POA: Diagnosis present

## 2015-07-23 DIAGNOSIS — J189 Pneumonia, unspecified organism: Secondary | ICD-10-CM

## 2015-07-23 MED ORDER — ALBUTEROL SULFATE HFA 108 (90 BASE) MCG/ACT IN AERS
2.0000 | INHALATION_SPRAY | Freq: Once | RESPIRATORY_TRACT | Status: AC
  Administered 2015-07-23: 2 via RESPIRATORY_TRACT
  Filled 2015-07-23: qty 6.7

## 2015-07-23 MED ORDER — AZITHROMYCIN 250 MG PO TABS
500.0000 mg | ORAL_TABLET | Freq: Once | ORAL | Status: AC
  Administered 2015-07-23: 500 mg via ORAL
  Filled 2015-07-23: qty 2

## 2015-07-23 MED ORDER — IPRATROPIUM-ALBUTEROL 0.5-2.5 (3) MG/3ML IN SOLN
3.0000 mL | Freq: Once | RESPIRATORY_TRACT | Status: AC
  Administered 2015-07-23: 3 mL via RESPIRATORY_TRACT
  Filled 2015-07-23: qty 3

## 2015-07-23 MED ORDER — GUAIFENESIN-CODEINE 100-10 MG/5ML PO SYRP: 5.0000 mL | ORAL_SOLUTION | Freq: Three times a day (TID) | ORAL | Status: DC | PRN

## 2015-07-23 MED ORDER — GUAIFENESIN-CODEINE 100-10 MG/5ML PO SOLN
10.0000 mL | Freq: Once | ORAL | Status: AC
  Administered 2015-07-23: 10 mL via ORAL
  Filled 2015-07-23: qty 10

## 2015-07-23 MED ORDER — AZITHROMYCIN 250 MG PO TABS: 250.0000 mg | ORAL_TABLET | Freq: Every day | ORAL | Status: DC

## 2015-07-23 NOTE — ED Provider Notes (Signed)
CSN: [id number]     Arrival date & time 07/23/15  0945 History   First MD Initiated Contact with Patient 07/23/15 [phone]     Chief Complaint  Patient presents with  . Cough  . Fever     (Consider location/radiation/quality/duration/timing/severity/associated sxs/prior Treatment) HPI   54 year old male with cough. Language barrier. Arabic interpreter use. Symptom onset about 3 days ago. Productive cough of yellowish sputum. Burning sensation in upper chest and throat. No fevers or chills. No unusual leg pain or swelling. Has been taking Tylenol and Delsym with minimal relief. No sick contacts. No n/v. Denies hx of DVT or PE.  Past Medical History  Diagnosis Date  . Diabetes mellitus without complication [facility])    Past Surgical History  Procedure Laterality Date  . Shoulder arthroscopy     Family History  Problem Relation Age of Onset  . Diabetes Mother   . Hypertension Mother   . Hypertension Father   . Diabetes Father    Social History  Substance Use Topics  . Smoking status: Never Smoker   . Smokeless tobacco: None  . Alcohol Use: No    Review of Systems  All systems reviewed and negative, other than as noted in HPI.   Allergies  Review of patient's allergies indicates no known allergies.  Home Medications   Prior to Admission medications   Medication Sig Start Date End Date Taking? Authorizing Provider  acetaminophen-codeine (TYLENOL #3) 300-30 MG per tablet TAKE 1 TABLET BY MOUTH EVERY 4 HOURS AS NEEDED   Yes Tresa Garter, MD  ACCU-CHEK SOFTCLIX LANCETS lancets  01/01/15   Historical Provider, MD  atorvastatin (LIPITOR) 20 MG tablet Take 1 tablet (20 mg total) by mouth daily. 02/11/15   Tresa Garter, MD  glipiZIDE (GLUCOTROL XL) 10 MG 24 hr tablet Take 1 tablet (10 mg total) by mouth daily with breakfast. 06/07/15   Tresa Garter, MD  ipratropium (ATROVENT) 0.06 % nasal spray Place 2 sprays into both nostrils 4 (four) times daily. Patient not  taking: Reported on 09/16/2014 06/04/14   Gregor Hams, MD  metFORMIN (GLUCOPHAGE) 1000 MG tablet Take 1 tablet (1,000 mg total) by mouth 2 (two) times daily with a meal. 02/11/15   Tresa Garter, MD  methocarbamol (ROBAXIN) 500 MG tablet  10/02/14   Historical Provider, MD  naproxen (NAPROSYN) 500 MG tablet Take 500 mg by mouth 2 (two) times daily with a meal.  08/28/14   Historical Provider, MD  traMADol (ULTRAM) 50 MG tablet Take 1 tablet twice a day as needed for pain 06/07/15   Tresa Garter, MD   BP 133/78 mmHg  Pulse 82  Temp(Src) 99.3 F (37.4 C) (Oral)  Resp 19  Ht 5\' 5"  (1.651 m)  Wt 200 lb (90.719 kg)  BMI 33.28 kg/m2  SpO2 99% Physical Exam  Constitutional: He appears well-developed and well-nourished. No distress.  HENT:  Head: Normocephalic and atraumatic.  Eyes: Conjunctivae are normal. Right eye exhibits no discharge. Left eye exhibits no discharge.  Neck: Neck supple.  Cardiovascular: Normal rate, regular rhythm and normal heart sounds.  Exam reveals no gallop and no friction rub.   No murmur heard. Pulmonary/Chest: Effort normal.  Rhonchi right lower lobe. No increased work of breathing.  Abdominal: Soft. He exhibits no distension. There is no tenderness.  Musculoskeletal: He exhibits no edema or tenderness.  Lower extremities symmetric as compared to each other. No calf tenderness. Negative Homan's. No palpable cords.   Neurological: He  is alert.  Skin: Skin is warm and dry.  Psychiatric: He has a normal mood and affect. His behavior is normal. Thought content normal.  Nursing note and vitals reviewed.   ED Course  Procedures (including critical care time) Labs Review Labs Reviewed - No data to display  Imaging Review Dg Chest 2 View  07/23/2015  CLINICAL DATA:  54 year old male with cough, headache and fever for 3 days. EXAM: CHEST  2 VIEW COMPARISON:  None. FINDINGS: Upper limits normal heart size noted. Peribronchial thickening is present. Patchy  right lower lobe opacity is suspicious for pneumonia. There is no evidence of pleural effusion, pneumothorax or pulmonary mass. No acute bony abnormalities are identified. IMPRESSION: Patchy right lower lobe opacity suspicious for pneumonia. Radiographic follow-up to resolution is recommended. Electronically Signed   By: Margarette Canada M.D.   On: 07/23/2015 11:21   I have personally reviewed and evaluated these images and lab results as part of my medical decision-making.   EKG Interpretation None      MDM   Final diagnoses:  CAP (community acquired pneumonia)    Clinical picture consistent with pneumonia. Patient is generally well appearing. No increased work of breathing. Oxygen saturations are normal on room air. I feel he is appropriate for outpatient treatment. He is given his first dose of azithromycin the emergency room. Symptomatic treatment for his cough. Return precautions discussed. Outpatient follow-up otherwise.    Virgel Manifold, MD 07/23/15 1134

## 2015-07-23 NOTE — ED Notes (Signed)
EDP at the bedside.  ?

## 2015-07-23 NOTE — Discharge Instructions (Signed)

## 2015-07-23 NOTE — ED Notes (Signed)
Reports cough, congestion and fever over the past 3 days. Has tried tylenol at home without much relief.

## 2015-08-06 NOTE — Patient Instructions (Addendum)
YOUR PROCEDURE IS SCHEDULED ON :  08/13/15  REPORT TO [facility] MAIN ENTRANCE FOLLOW SIGNS TO EAST ELEVATOR - GO TO 3rd FLOOR CHECK IN AT 3 EAST NURSES STATION (SHORT STAY) AT:  9:15 AM  CALL THIS NUMBER IF YOU HAVE PROBLEMS THE MORNING OF SURGERY [phone]  REMEMBER:ONLY 1 PER PERSON MAY GO TO SHORT STAY WITH YOU TO GET READY THE MORNING OF YOUR SURGERY  DO NOT EAT FOOD OR DRINK LIQUIDS AFTER MIDNIGHT  TAKE THESE MEDICINES THE MORNING OF SURGERY: MAY TAKE TRAMADOL FOR PAIN ( DO NOT TAKE ANY DIABETIC MEDICINE THE MORNING OF SURGERY)_  STOP ASPIRIN / IBUPROFEN / ALEVE / VITAMINS / HERBAL MEDS __5__ DAYS BEFORE SURGERY  YOU MAY NOT HAVE ANY METAL ON YOUR BODY INCLUDING HAIR PINS AND PIERCING'S. DO NOT WEAR JEWELRY, MAKEUP, LOTIONS, POWDERS OR PERFUMES. DO NOT WEAR NAIL POLISH. DO NOT SHAVE 48 HRS PRIOR TO SURGERY. MEN MAY SHAVE FACE AND NECK.  DO NOT [place]. [place] IS NOT RESPONSIBLE FOR VALUABLES.  CONTACTS, DENTURES OR PARTIALS MAY NOT BE WORN TO SURGERY. LEAVE SUITCASE IN CAR. CAN BE BROUGHT TO ROOM AFTER SURGERY.  PATIENTS DISCHARGED THE DAY OF SURGERY WILL NOT BE ALLOWED TO DRIVE HOME.  PLEASE READ OVER THE FOLLOWING INSTRUCTION SHEETS _________________________________________________________________________________                                          [place] - PREPARING FOR SURGERY  Before surgery, you can play an important role.  Because skin is not sterile, your skin needs to be as free of germs as possible.  You can reduce the number of germs on your skin by washing with CHG (chlorahexidine gluconate) soap before surgery.  CHG is an antiseptic cleaner which kills germs and bonds with the skin to continue killing germs even after washing. Please DO NOT use if you have an allergy to CHG or antibacterial soaps.  If your skin becomes reddened/irritated stop using the CHG and inform your nurse when you arrive at Short  Stay. Do not shave (including legs and underarms) for at least 48 hours prior to the first CHG shower.  You may shave your face. Please follow these instructions carefully:   1.  Shower with CHG Soap the night before surgery and the  morning of Surgery.   2.  If you choose to wash your hair, wash your hair first as usual with your  normal  Shampoo.   3.  After you shampoo, rinse your hair and body thoroughly to remove the  shampoo.                                         4.  Use CHG as you would any other liquid soap.  You can apply chg directly  to the skin and wash . Gently wash with scrungie or clean wascloth    5.  Apply the CHG Soap to your body ONLY FROM THE NECK DOWN.   Do not use on open                           Wound or open sores. Avoid contact with eyes, ears mouth and genitals (private parts).  Genitals (private parts) with your normal soap.              6.  Wash thoroughly, paying special attention to the area where your surgery  will be performed.   7.  Thoroughly rinse your body with warm water from the neck down.   8.  DO NOT shower/wash with your normal soap after using and rinsing off  the CHG Soap .                9.  Pat yourself dry with a clean towel.             10.  Wear clean night clothes to bed after shower             11.  Place clean sheets on your bed the night of your first shower and do not  sleep with pets.  Day of Surgery : Do not apply any lotions/deodorants the morning of surgery.  Please wear clean clothes to the hospital/surgery center.  FAILURE TO FOLLOW THESE INSTRUCTIONS MAY RESULT IN THE CANCELLATION OF YOUR SURGERY    PATIENT SIGNATURE_________________________________  ______________________________________________________________________

## 2015-08-09 ENCOUNTER — Encounter (HOSPITAL_COMMUNITY): Payer: Self-pay

## 2015-08-09 ENCOUNTER — Ambulatory Visit (HOSPITAL_COMMUNITY)
Admission: RE | Admit: 2015-08-09 | Discharge: 2015-08-09 | Disposition: A | Discharge status: Home / Self Care | Payer: Medicaid Other | Source: Ambulatory Visit | Attending: Anesthesiology | Admitting: Anesthesiology

## 2015-08-09 ENCOUNTER — Encounter (INDEPENDENT_AMBULATORY_CARE_PROVIDER_SITE_OTHER): Payer: Self-pay

## 2015-08-09 ENCOUNTER — Encounter (HOSPITAL_COMMUNITY)
Admission: RE | Admit: 2015-08-09 | Discharge: 2015-08-09 | Disposition: A | Discharge status: Home / Self Care | Payer: Medicaid Other | Source: Ambulatory Visit | Attending: Surgery | Admitting: Surgery

## 2015-08-09 DIAGNOSIS — Z01812 Encounter for preprocedural laboratory examination: Secondary | ICD-10-CM | POA: Insufficient documentation

## 2015-08-09 DIAGNOSIS — C73 Malignant neoplasm of thyroid gland: Secondary | ICD-10-CM | POA: Diagnosis not present

## 2015-08-09 DIAGNOSIS — Z01818 Encounter for other preprocedural examination: Secondary | ICD-10-CM | POA: Insufficient documentation

## 2015-08-09 HISTORY — DX: Neoplasm of unspecified behavior of endocrine glands and other parts of nervous system: D49.7

## 2015-08-09 HISTORY — DX: Unspecified osteoarthritis, unspecified site: M19.90

## 2015-08-09 HISTORY — DX: Hyperlipidemia, unspecified: E78.5

## 2015-08-09 HISTORY — DX: Unspecified visual disturbance: H53.9

## 2015-08-09 LAB — BASIC METABOLIC PANEL
ANION GAP: 7 (ref 5–15)
BUN: 16 mg/dL (ref 6–20)
CO2: 28 mmol/L (ref 22–32)
Calcium: 9.1 mg/dL (ref 8.9–10.3)
Chloride: 103 mmol/L (ref 101–111)
Creatinine, Ser: 0.9 mg/dL (ref 0.61–1.24)
GFR calc non Af Amer: >60 mL/min (ref >60)
GLUCOSE: 240 mg/dL — AB (ref 65–99)
POTASSIUM: 4.9 mmol/L (ref 3.5–5.1)
Sodium: 138 mmol/L (ref 135–145)

## 2015-08-09 LAB — CBC
HCT: 41.5 % (ref 39.0–52.0)
Hemoglobin: 12.7 g/dL — ABNORMAL LOW (ref 13.0–17.0)
MCH: 25.9 pg — ABNORMAL LOW (ref 26.0–34.0)
MCHC: 30.6 g/dL (ref 30.0–36.0)
MCV: 84.5 fL (ref 78.0–100.0)
Platelets: 255 10*3/uL (ref 150–400)
RBC: 4.91 MIL/uL (ref 4.22–5.81)
RDW: 13.4 % (ref 11.5–15.5)
WBC: 6.5 10*3/uL (ref 4.0–10.5)

## 2015-08-12 ENCOUNTER — Telehealth: Payer: Self-pay | Admitting: Internal Medicine

## 2015-08-12 ENCOUNTER — Encounter (HOSPITAL_COMMUNITY): Payer: Self-pay | Admitting: Surgery

## 2015-08-12 DIAGNOSIS — D440 Neoplasm of uncertain behavior of thyroid gland: Secondary | ICD-10-CM | POA: Diagnosis present

## 2015-08-12 NOTE — H&P (Signed)
General Surgery [facility]  Joe Wallace DOB: Mar 12, 1961 Married / Language: Arabic / Race: Undefined Male  History of Present Illness The patient is a 54 year old male who presents with a thyroid nodule.  Patient is referred by Dr. Delrae Wallace for evaluation of bilateral thyroid nodules with cytologic atypia. Patient was found on routine examination by his primary care physician to have thyroid nodules. He underwent a thyroid ultrasound in April 2016 showing a normal size thyroid gland with a 7 mm nodule on the right and a dominant 1.6 cm nodule on the left. There is also a 9 mm nodule in the upper left lobe with internal calcifications. Patient subsequently underwent ultrasound-guided fine-needle aspiration biopsy of the dominant nodule in the left thyroid lobe. This showed significant atypia consistent with a follicular lesion of undetermined significance, Bethesda category 3. Patient then underwent an Afirma test. This showed a suspicious lesion with a risk of malignancy of greater than 40%. Patient is therefore referred for consideration for total thyroidectomy for management and definitive diagnosis. Patient has no prior history of thyroid disease. He has never been on thyroid medication. There is no family history of thyroid disease and no family history of other endocrine neoplasms. Patient has had no prior head or neck surgery. Patient does note some moderate dysphagia and wondered whether or not this was related to his thyroid nodules. Patient speaks only Arabic. His interview today is conducted through a Optometrist.   Other Problems Diabetes Mellitus  Past Surgical History  Shoulder Surgery Left.  Diagnostic Studies History  Colonoscopy never  Allergies No Known Drug Allergies10/17/2016  Medication History Accu-Chek Active (In Vitro) Active. Atorvastatin Calcium (20MG  Tablet, Oral) Active. Ibuprofen (600MG  Tablet, Oral)  Active. MetFORMIN HCl (1000MG  Tablet, Oral) Active. Naproxen Kit (500MG  Tablet, Oral) Active. Medications Reconciled  Social History Caffeine use Coffee. No alcohol use Tobacco use Never smoker.  Family History  Heart Disease Mother. Hypertension Father.  Review of Systems General Not Present- Appetite Loss, Chills, Fatigue, Fever, Night Sweats, Weight Gain and Weight Loss. Skin Not Present- Change in Wart/Mole, Dryness, Hives, Jaundice, New Lesions, Non-Healing Wounds, Rash and Ulcer. HEENT Not Present- Earache, Hearing Loss, Hoarseness, Nose Bleed, Oral Ulcers, Ringing in the Ears, Seasonal Allergies, Sinus Pain, Sore Throat, Visual Disturbances, Wears glasses/contact lenses and Yellow Eyes. Breast Not Present- Breast Mass, Breast Pain, Nipple Discharge and Skin Changes. Cardiovascular Not Present- Chest Pain, Difficulty Breathing Lying Down, Leg Cramps, Palpitations, Rapid Heart Rate, Shortness of Breath and Swelling of Extremities. Gastrointestinal Not Present- Abdominal Pain, Bloating, Bloody Stool, Change in Bowel Habits, Chronic diarrhea, Constipation, Difficulty Swallowing, Excessive gas, Gets full quickly at meals, Hemorrhoids, Indigestion, Nausea, Rectal Pain and Vomiting. Male Genitourinary Not Present- Blood in Urine, Change in Urinary Stream, Frequency, Impotence, Nocturia, Painful Urination, Urgency and Urine Leakage. Musculoskeletal Not Present- Back Pain, Joint Pain, Joint Stiffness, Muscle Pain, Muscle Weakness and Swelling of Extremities. Neurological Not Present- Decreased Memory, Fainting, Headaches, Numbness, Seizures, Tingling, Tremor, Trouble walking and Weakness. Psychiatric Not Present- Anxiety, Bipolar, Change in Sleep Pattern, Depression, Fearful and Frequent crying. Endocrine Not Present- Cold Intolerance, Excessive Hunger, Hair Changes, Heat Intolerance, Hot flashes and New Diabetes. Hematology Not Present- Easy Bruising, Excessive bleeding, Gland  problems, HIV and Persistent Infections.  Vitals Weight: 224 lb Height: 69in Body Surface Area: 2.17 m Body Mass Index: 33.08 kg/m  Temp.: 97.63F(Temporal)  Pulse: 76 (Regular)  BP: 130/70 (Sitting, Left Arm, Standard)   Physical Exam   General - appears  comfortable, no distress; not diaphorectic  HEENT - normocephalic; sclerae clear, gaze conjugate; mucous membranes moist, dentition good; voice normal  Neck - symmetric on extension; no palpable anterior or posterior cervical adenopathy; palpable nodule left thyroid lobe, smooth, soft, mobile with swallowing, nontender; right thyroid lobe without palpable abnormality  Chest - clear bilaterally without rhonchi, rales, or wheeze  Cor - regular rhythm with normal rate; no significant murmur  Ext - non-tender without significant edema or lymphedema  Neuro - grossly intact; no tremor   Assessment & Plan  NEOPLASM OF UNCERTAIN BEHAVIOR OF THYROID GLAND (D44.0) MULTIPLE THYROID NODULES (E04.2)  Patient presents on referral with bilateral thyroid nodules. Dominant nodule on the left shows cytologic atypia on needle biopsy and has a risk of malignancy of approximately 40%.  Utilizing the interpreter, I have had a discussion with the patient regarding my recommendation for total thyroidectomy. We have discussed risk and benefits including the risk of recurrent laryngeal nerve injury and injury to parathyroid glands. We have discussed the location of the surgical incision, the hospitalization to be anticipated, and the postoperative recovery. We have discussed the potential need for radioactive iodine treatment. Patient will require lifelong thyroid hormone replacement therapy. They understand and wish to proceed in the near future.  The risks and benefits of the procedure have been discussed at length with the patient. The patient understands the proposed procedure, potential alternative treatments, and the course of recovery  to be expected. All of the patient's questions have been answered at this time. The patient wishes to proceed with surgery.  Joe Regal, MD, [facility] Office: [phone]

## 2015-08-12 NOTE — Telephone Encounter (Signed)
Patient is having surgery tomorrow and would like a refill on the following.   glipiZIDE (GLUCOTROL XL) 10 MG 24 hr tablet traMADol (ULTRAM) 50 MG tablet metFORMIN (GLUCOPHAGE) 1000 MG tablet

## 2015-08-13 ENCOUNTER — Encounter (HOSPITAL_COMMUNITY): Payer: Self-pay | Admitting: *Deleted

## 2015-08-13 ENCOUNTER — Ambulatory Visit (HOSPITAL_COMMUNITY): Payer: Medicaid Other | Admitting: Anesthesiology

## 2015-08-13 ENCOUNTER — Encounter (HOSPITAL_COMMUNITY)
Admission: RE | Disposition: A | Discharge status: Home / Self Care | Payer: Self-pay | Source: Ambulatory Visit | Attending: Surgery

## 2015-08-13 ENCOUNTER — Observation Stay (HOSPITAL_COMMUNITY)
Admission: RE | Admit: 2015-08-13 | Discharge: 2015-08-14 | Disposition: A | Discharge status: Home / Self Care | Payer: Medicaid Other | Source: Ambulatory Visit | Attending: Surgery | Admitting: Surgery

## 2015-08-13 DIAGNOSIS — E669 Obesity, unspecified: Secondary | ICD-10-CM | POA: Insufficient documentation

## 2015-08-13 DIAGNOSIS — E119 Type 2 diabetes mellitus without complications: Secondary | ICD-10-CM | POA: Diagnosis not present

## 2015-08-13 DIAGNOSIS — Z7984 Long term (current) use of oral hypoglycemic drugs: Secondary | ICD-10-CM | POA: Diagnosis not present

## 2015-08-13 DIAGNOSIS — Z6833 Body mass index (BMI) 33.0-33.9, adult: Secondary | ICD-10-CM | POA: Diagnosis not present

## 2015-08-13 DIAGNOSIS — C73 Malignant neoplasm of thyroid gland: Secondary | ICD-10-CM | POA: Diagnosis not present

## 2015-08-13 DIAGNOSIS — Z791 Long term (current) use of non-steroidal anti-inflammatories (NSAID): Secondary | ICD-10-CM | POA: Insufficient documentation

## 2015-08-13 DIAGNOSIS — Z79899 Other long term (current) drug therapy: Secondary | ICD-10-CM | POA: Diagnosis not present

## 2015-08-13 DIAGNOSIS — D440 Neoplasm of uncertain behavior of thyroid gland: Secondary | ICD-10-CM | POA: Diagnosis present

## 2015-08-13 DIAGNOSIS — E042 Nontoxic multinodular goiter: Secondary | ICD-10-CM | POA: Diagnosis present

## 2015-08-13 HISTORY — PX: THYROIDECTOMY: SHX17

## 2015-08-13 LAB — GLUCOSE, CAPILLARY
GLUCOSE-CAPILLARY: 148 mg/dL — AB (ref 65–99)
GLUCOSE-CAPILLARY: 143 mg/dL — AB (ref 65–99)
GLUCOSE-CAPILLARY: 169 mg/dL — AB (ref 65–99)
GLUCOSE-CAPILLARY: 180 mg/dL — AB (ref 65–99)
Glucose-Capillary: 199 mg/dL — ABNORMAL HIGH (ref 65–99)

## 2015-08-13 SURGERY — THYROIDECTOMY
Anesthesia: General

## 2015-08-13 MED ORDER — KCL IN DEXTROSE-NACL 20-5-0.45 MEQ/L-%-% IV SOLN
INTRAVENOUS | Status: DC
  Administered 2015-08-13: 17:00:00 via INTRAVENOUS
  Filled 2015-08-13 (×2): qty 1000

## 2015-08-13 MED ORDER — METFORMIN HCL 500 MG PO TABS
1000.0000 mg | ORAL_TABLET | Freq: Two times a day (BID) | ORAL | Status: DC
  Administered 2015-08-14: 1000 mg via ORAL
  Filled 2015-08-13 (×2): qty 2

## 2015-08-13 MED ORDER — LIDOCAINE HCL (CARDIAC) 20 MG/ML IV SOLN
INTRAVENOUS | Status: DC | PRN
  Administered 2015-08-13: 100 mg via INTRAVENOUS

## 2015-08-13 MED ORDER — HYDROMORPHONE HCL 1 MG/ML IJ SOLN
1.0000 mg | INTRAMUSCULAR | Status: DC | PRN
  Administered 2015-08-13: 1 mg via INTRAVENOUS
  Filled 2015-08-13: qty 1

## 2015-08-13 MED ORDER — CEFAZOLIN SODIUM-DEXTROSE 2-3 GM-% IV SOLR
2.0000 g | INTRAVENOUS | Status: AC
  Administered 2015-08-13: 2 g via INTRAVENOUS

## 2015-08-13 MED ORDER — FENTANYL CITRATE (PF) 250 MCG/5ML IJ SOLN
INTRAMUSCULAR | Status: AC
  Filled 2015-08-13: qty 5

## 2015-08-13 MED ORDER — CEFAZOLIN SODIUM-DEXTROSE 2-3 GM-% IV SOLR
INTRAVENOUS | Status: AC
  Filled 2015-08-13: qty 50

## 2015-08-13 MED ORDER — HYDROCODONE-ACETAMINOPHEN 5-325 MG PO TABS
1.0000 | ORAL_TABLET | ORAL | Status: DC | PRN
  Administered 2015-08-13 – 2015-08-14 (×3): 2 via ORAL
  Filled 2015-08-13: qty 2
  Filled 2015-08-13: qty 1
  Filled 2015-08-13 (×2): qty 2
  Filled 2015-08-13: qty 1
  Filled 2015-08-13: qty 2

## 2015-08-13 MED ORDER — PROPOFOL 10 MG/ML IV BOLUS
INTRAVENOUS | Status: AC
  Filled 2015-08-13: qty 20

## 2015-08-13 MED ORDER — MIDAZOLAM HCL 5 MG/5ML IJ SOLN
INTRAMUSCULAR | Status: DC | PRN
Start: 2015-08-13 — End: 2015-08-13
  Administered 2015-08-13: 2 mg via INTRAVENOUS

## 2015-08-13 MED ORDER — GLYCOPYRROLATE 0.2 MG/ML IJ SOLN
INTRAMUSCULAR | Status: DC | PRN
  Administered 2015-08-13: .8 mg via INTRAVENOUS

## 2015-08-13 MED ORDER — TRAMADOL HCL 50 MG PO TABS: 50.0000 mg | ORAL_TABLET | Freq: Four times a day (QID) | ORAL | Status: DC | PRN

## 2015-08-13 MED ORDER — MELOXICAM 7.5 MG PO TABS
7.5000 mg | ORAL_TABLET | Freq: Two times a day (BID) | ORAL | Status: DC
  Administered 2015-08-13: 7.5 mg via ORAL
  Filled 2015-08-13 (×3): qty 1

## 2015-08-13 MED ORDER — MIDAZOLAM HCL 2 MG/2ML IJ SOLN
INTRAMUSCULAR | Status: AC
  Filled 2015-08-13: qty 2

## 2015-08-13 MED ORDER — GLIPIZIDE ER 10 MG PO TB24
10.0000 mg | ORAL_TABLET | Freq: Every day | ORAL | Status: DC
  Administered 2015-08-14: 10 mg via ORAL
  Filled 2015-08-13: qty 1

## 2015-08-13 MED ORDER — ACETAMINOPHEN 650 MG RE SUPP: 650.0000 mg | Freq: Four times a day (QID) | RECTAL | Status: DC | PRN

## 2015-08-13 MED ORDER — SUCCINYLCHOLINE CHLORIDE 20 MG/ML IJ SOLN
INTRAMUSCULAR | Status: DC | PRN
  Administered 2015-08-13: 100 mg via INTRAVENOUS
  Administered 2015-08-13: 30 mg via INTRAVENOUS

## 2015-08-13 MED ORDER — ONDANSETRON HCL 4 MG/2ML IJ SOLN
INTRAMUSCULAR | Status: DC | PRN
  Administered 2015-08-13: 4 mg via INTRAVENOUS

## 2015-08-13 MED ORDER — EPHEDRINE SULFATE 50 MG/ML IJ SOLN
INTRAMUSCULAR | Status: DC | PRN
  Administered 2015-08-13 (×3): 5 mg via INTRAVENOUS

## 2015-08-13 MED ORDER — CALCIUM CARBONATE 1250 (500 CA) MG PO TABS
2.0000 | ORAL_TABLET | Freq: Three times a day (TID) | ORAL | Status: DC
  Administered 2015-08-14: 1000 mg via ORAL
  Filled 2015-08-13: qty 2

## 2015-08-13 MED ORDER — LACTATED RINGERS IV SOLN: INTRAVENOUS | Status: DC

## 2015-08-13 MED ORDER — HYDROMORPHONE HCL 1 MG/ML IJ SOLN
0.2500 mg | INTRAMUSCULAR | Status: DC | PRN
  Administered 2015-08-13 (×3): 0.25 mg via INTRAVENOUS

## 2015-08-13 MED ORDER — NEOSTIGMINE METHYLSULFATE 10 MG/10ML IV SOLN
INTRAVENOUS | Status: DC | PRN
  Administered 2015-08-13: 5 mg via INTRAVENOUS

## 2015-08-13 MED ORDER — ACETAMINOPHEN 325 MG PO TABS: 650.0000 mg | ORAL_TABLET | Freq: Four times a day (QID) | ORAL | Status: DC | PRN

## 2015-08-13 MED ORDER — ROCURONIUM BROMIDE 100 MG/10ML IV SOLN
INTRAVENOUS | Status: DC | PRN
  Administered 2015-08-13: 10 mg via INTRAVENOUS
  Administered 2015-08-13: 30 mg via INTRAVENOUS

## 2015-08-13 MED ORDER — PROPOFOL 10 MG/ML IV BOLUS
INTRAVENOUS | Status: DC | PRN
  Administered 2015-08-13: 200 mg via INTRAVENOUS

## 2015-08-13 MED ORDER — FENTANYL CITRATE (PF) 100 MCG/2ML IJ SOLN
INTRAMUSCULAR | Status: DC | PRN
  Administered 2015-08-13 (×5): 50 ug via INTRAVENOUS

## 2015-08-13 MED ORDER — ONDANSETRON HCL 4 MG/2ML IJ SOLN: 4.0000 mg | Freq: Four times a day (QID) | INTRAMUSCULAR | Status: DC | PRN

## 2015-08-13 MED ORDER — ONDANSETRON HCL 4 MG/2ML IJ SOLN
INTRAMUSCULAR | Status: AC
  Filled 2015-08-13: qty 2

## 2015-08-13 MED ORDER — LIDOCAINE HCL (CARDIAC) 20 MG/ML IV SOLN
INTRAVENOUS | Status: AC
  Filled 2015-08-13: qty 5

## 2015-08-13 MED ORDER — ONDANSETRON 4 MG PO TBDP: 4.0000 mg | ORAL_TABLET | Freq: Four times a day (QID) | ORAL | Status: DC | PRN

## 2015-08-13 MED ORDER — HYDROMORPHONE HCL 1 MG/ML IJ SOLN
INTRAMUSCULAR | Status: AC
Start: 2015-08-13 — End: 2015-08-13
  Filled 2015-08-13: qty 1

## 2015-08-13 MED ORDER — LACTATED RINGERS IV SOLN
INTRAVENOUS | Status: DC
  Administered 2015-08-13: 1000 mL via INTRAVENOUS

## 2015-08-13 SURGICAL SUPPLY — 36 items
ATTRACTOMAT 16X20 MAGNETIC DRP (DRAPES) ×2 IMPLANT
BENZOIN TINCTURE PRP APPL 2/3 (GAUZE/BANDAGES/DRESSINGS) ×2 IMPLANT
BLADE HEX COATED 2.75 (ELECTRODE) ×2 IMPLANT
BLADE SURG 15 STRL LF DISP TIS (BLADE) ×1 IMPLANT
BLADE SURG 15 STRL SS (BLADE) ×1
CHLORAPREP W/TINT 26ML (MISCELLANEOUS) ×2 IMPLANT
CLIP TI MEDIUM 6 (CLIP) ×4 IMPLANT
CLIP TI WIDE RED SMALL 6 (CLIP) ×10 IMPLANT
COVER SURGICAL LIGHT HANDLE (MISCELLANEOUS) ×2 IMPLANT
DISSECTOR ROUND CHERRY 3/8 STR (MISCELLANEOUS) IMPLANT
DRAPE LAPAROTOMY T 98X78 PEDS (DRAPES) ×2 IMPLANT
DRESSING SURGICEL FIBRLLR 1X2 (HEMOSTASIS) ×1 IMPLANT
DRSG SURGICEL FIBRILLAR 1X2 (HEMOSTASIS) ×2
ELECT PENCIL ROCKER SW 15FT (MISCELLANEOUS) ×2 IMPLANT
ELECT REM PT RETURN 9FT ADLT (ELECTROSURGICAL) ×2
ELECTRODE REM PT RTRN 9FT ADLT (ELECTROSURGICAL) ×1 IMPLANT
GAUZE SPONGE 4X4 12PLY STRL (GAUZE/BANDAGES/DRESSINGS) ×2 IMPLANT
GAUZE SPONGE 4X4 16PLY XRAY LF (GAUZE/BANDAGES/DRESSINGS) ×2 IMPLANT
GLOVE SURG ORTHO 8.0 STRL STRW (GLOVE) ×2 IMPLANT
GOWN STRL REUS W/TWL XL LVL3 (GOWN DISPOSABLE) ×4 IMPLANT
KIT BASIN OR (CUSTOM PROCEDURE TRAY) ×2 IMPLANT
LIQUID BAND (GAUZE/BANDAGES/DRESSINGS) ×2 IMPLANT
PACK BASIC VI WITH GOWN DISP (CUSTOM PROCEDURE TRAY) ×2 IMPLANT
SHEARS HARMONIC 9CM CVD (BLADE) ×2 IMPLANT
STAPLER VISISTAT 35W (STAPLE) IMPLANT
STRIP CLOSURE SKIN 1/2X4 (GAUZE/BANDAGES/DRESSINGS) ×2 IMPLANT
SUT MNCRL AB 4-0 PS2 18 (SUTURE) ×2 IMPLANT
SUT SILK 2 0 (SUTURE)
SUT SILK 2-0 18XBRD TIE 12 (SUTURE) IMPLANT
SUT SILK 3 0 (SUTURE)
SUT SILK 3-0 18XBRD TIE 12 (SUTURE) IMPLANT
SUT VIC AB 3-0 SH 18 (SUTURE) ×6 IMPLANT
SYR BULB IRRIGATION 50ML (SYRINGE) ×2 IMPLANT
TOWEL OR 17X26 10 PK STRL BLUE (TOWEL DISPOSABLE) ×2 IMPLANT
TOWEL OR NON WOVEN STRL DISP B (DISPOSABLE) ×2 IMPLANT
YANKAUER SUCT BULB TIP 10FT TU (MISCELLANEOUS) ×2 IMPLANT

## 2015-08-13 NOTE — Transfer of Care (Signed)
Immediate Anesthesia Transfer of Care Note  Patient: Joe Wallace  Procedure(s) Performed: Procedure(s): TOTAL THYROIDECTOMY (N/A)  Patient Location: PACU  Anesthesia Type:General  Level of Consciousness: awake, alert , oriented and patient cooperative  Airway & Oxygen Therapy: Patient Spontanous Breathing and Patient connected to face mask oxygen  Post-op Assessment: Report given to RN, Post -op Vital signs reviewed and stable and Patient moving all extremities  Post vital signs: Reviewed and stable  Last Vitals:  Filed Vitals:   08/13/15 0935  BP: 132/83  Pulse: 75  Temp: 36.5 C  Resp: 18    Complications: No apparent anesthesia complications

## 2015-08-13 NOTE — Anesthesia Preprocedure Evaluation (Addendum)
Anesthesia Evaluation  Patient identified by MRN, date of birth, ID band Patient awake    Reviewed: Allergy & Precautions, H&P , NPO status , Patient's Chart, lab work & pertinent test results  Airway Mallampati: II  TM Distance: >3 FB Neck ROM: full    Dental no notable dental hx. (+) Dental Advisory Given, Teeth Intact   Pulmonary neg pulmonary ROS,    Pulmonary exam normal breath sounds clear to auscultation       Cardiovascular Exercise Tolerance: Good negative cardio ROS Normal cardiovascular exam Rhythm:regular Rate:Normal     Neuro/Psych negative neurological ROS  negative psych ROS   GI/Hepatic negative GI ROS, Neg liver ROS,   Endo/Other  negative endocrine ROSdiabetes, Well Controlled, Type 2, Oral Hypoglycemic AgentsThyroid neoplasm  Renal/GU negative Renal ROS  negative genitourinary   Musculoskeletal   Abdominal (+) - obese,   Peds  Hematology negative hematology ROS (+)   Anesthesia Other Findings   Reproductive/Obstetrics negative OB ROS                          Anesthesia Physical Anesthesia Plan  ASA: III  Anesthesia Plan: General   Post-op Pain Management:    Induction: Intravenous  Airway Management Planned: Oral ETT  Additional Equipment:   Intra-op Plan:   Post-operative Plan: Extubation in OR  Informed Consent: I have reviewed the patients History and Physical, chart, labs and discussed the procedure including the risks, benefits and alternatives for the proposed anesthesia with the patient or authorized representative who has indicated his/her understanding and acceptance.   Dental Advisory Given  Plan Discussed with: CRNA and Surgeon  Anesthesia Plan Comments:         Anesthesia Quick Evaluation

## 2015-08-13 NOTE — Op Note (Signed)
Procedure Note  Pre-operative Diagnosis:  Thyroid neoplasm of uncertain behavior, multiple thyroid nodules  Post-operative Diagnosis:  same  Surgeon:  Earnstine Regal, MD, FACS  Assistant:  RNFA   Procedure:  Total thyroidectomy  Anesthesia:  General  Estimated Blood Loss:  minimal  Drains: none         Specimen: thyroid to pathology  Indications:  Patient is referred by Dr. Delrae Rend for evaluation of bilateral thyroid nodules with cytologic atypia. Patient was found on routine examination by his primary care physician to have thyroid nodules. He underwent a thyroid ultrasound in April 2016 showing a normal size thyroid gland with a 7 mm nodule on the right and a dominant 1.6 cm nodule on the left. There is also a 9 mm nodule in the upper left lobe with internal calcifications. Patient subsequently underwent ultrasound-guided fine-needle aspiration biopsy of the dominant nodule in the left thyroid lobe. This showed significant atypia consistent with a follicular lesion of undetermined significance, Bethesda category 3. Patient then underwent an Afirma test. This showed a suspicious lesion with a risk of malignancy of greater than 40%. Patient is therefore referred for consideration for total thyroidectomy for management and definitive diagnosis.   Procedure Details: Procedure was done in OR #3 at the [facility]  The patient was brought to the operating room and placed in a supine position on the operating room table.  Following administration of general anesthesia, the patient was positioned and then prepped and draped in the usual aseptic fashion.  After ascertaining that an adequate level of anesthesia had been achieved, a Kocher incision was made with #15 blade.  Dissection was carried through subcutaneous tissues and platysma. Hemostasis was achieved with the electrocautery.  Skin flaps were elevated cephalad and caudad from the thyroid notch to the sternal notch.   The Mahorner self-retaining retractor was placed for exposure.  Strap muscles were incised in the midline and dissection was begun on the left side.  Strap muscles were reflected laterally.  Left thyroid lobe was normal in size and contained at least two palpable nodules.  The left lobe was gently mobilized with blunt dissection.  Superior pole vessels were dissected out and divided individually between small and medium Ligaclips with the Harmonic scalpel.  The thyroid lobe was rolled anteriorly.  Branches of the inferior thyroid artery were divided between small Ligaclips with the Harmonic scalpel.  Inferior venous tributaries were divided between Ligaclips.  Both the superior and inferior parathyroid glands were identified and preserved on their vascular pedicles.  The recurrent laryngeal nerve was identified and preserved along its course.  The ligament of Gwenlyn Found was released with the electrocautery and the gland was mobilized onto the anterior trachea. Isthmus was mobilized across the midline.  There was no pyramidal lobe present.  Dry pack was placed in the left neck.  Next, the right thyroid lobe was gently mobilized with blunt dissection.  Right thyroid lobe was slightly enlarged.  Superior pole vessels were dissected out and divided between small and medium Ligaclips with the Harmonic scalpel.  Superior parathyroid was identified and preserved.  Inferior venous tributaries were divided between medium Ligaclips with the Harmonic scalpel.  The right thyroid lobe was rolled anteriorly and the branches of the inferior thyroid artery divided between small Ligaclips.  The right recurrent laryngeal nerve was identified and preserved along its course.  The ligament of Gwenlyn Found was released with the electrocautery.  The right thyroid lobe was mobilized onto the anterior trachea  and the remainder of the thyroid was dissected off the anterior trachea and the thyroid was completely excised.  A suture was used to mark the  left lobe. The entire thyroid gland was submitted to pathology for review.  The neck was irrigated with warm saline.  Fibrillar was placed throughout the operative field.  Strap muscles were reapproximated in the midline with interrupted 3-0 Vicryl sutures.  Platysma was closed with interrupted 3-0 Vicryl sutures.  Skin was closed with a running 4-0 Monocryl subcuticular suture.  Wound was washed and dried and benzoin and steri-strips were applied.  Dry gauze dressing was placed.  The patient was awakened from anesthesia and brought to the recovery room.  The patient tolerated the procedure well.   Earnstine Regal, MD, [facility] Office: [phone]

## 2015-08-13 NOTE — Interval H&P Note (Signed)
History and Physical Interval Note:  08/13/2015 12:10 PM  Joe Wallace  has presented today for surgery, with the diagnosis of Thyroid Neoplasm.  The various methods of treatment have been discussed with the patient and family. After consideration of risks, benefits and other options for treatment, the patient has consented to    Procedure(s): TOTAL THYROIDECTOMY (N/A) as a surgical intervention .    The patient's history has been reviewed, patient examined, no change in status, stable for surgery.  I have reviewed the patient's chart and labs.  Questions were answered to the patient's satisfaction.    Earnstine Regal, MD, [facility] Office: [place]

## 2015-08-13 NOTE — Anesthesia Procedure Notes (Signed)
Procedure Name: Intubation Date/Time: 08/13/2015 12:21 PM Performed by: Carleene Cooper A Pre-anesthesia Checklist: Patient identified, Timeout performed, Emergency Drugs available, Suction available and Patient being monitored Patient Re-evaluated:Patient Re-evaluated prior to inductionOxygen Delivery Method: Circle system utilized Preoxygenation: Pre-oxygenation with 100% oxygen Intubation Type: IV induction Ventilation: Mask ventilation without difficulty and Oral airway inserted - appropriate to patient size Laryngoscope Size: Mac and 4 Grade View: Grade I Tube type: Oral Tube size: 7.5 mm Number of attempts: 1 Airway Equipment and Method: Stylet Placement Confirmation: ETT inserted through vocal cords under direct vision,  breath sounds checked- equal and bilateral and positive ETCO2 Secured at: 22 cm Tube secured with: Tape Dental Injury: Teeth and Oropharynx as per pre-operative assessment

## 2015-08-13 NOTE — Anesthesia Postprocedure Evaluation (Signed)
  Anesthesia Post-op Note  Patient: Joe Wallace  Procedure(s) Performed: Procedure(s) (LRB): TOTAL THYROIDECTOMY (N/A)  Patient Location: PACU  Anesthesia Type: General  Level of Consciousness: awake and alert   Airway and Oxygen Therapy: Patient Spontanous Breathing  Post-op Pain: mild  Post-op Assessment: Post-op Vital signs reviewed, Patient's Cardiovascular Status Stable, Respiratory Function Stable, Patent Airway and No signs of Nausea or vomiting  Last Vitals:  Filed Vitals:   08/13/15 1527  BP: 153/93  Pulse: 66  Temp: 36.6 C  Resp: 15    Post-op Vital Signs: stable   Complications: No apparent anesthesia complications

## 2015-08-14 DIAGNOSIS — C73 Malignant neoplasm of thyroid gland: Secondary | ICD-10-CM | POA: Diagnosis not present

## 2015-08-14 LAB — BASIC METABOLIC PANEL
ANION GAP: 8 (ref 5–15)
BUN: 13 mg/dL (ref 6–20)
CALCIUM: 8.9 mg/dL (ref 8.9–10.3)
CO2: 29 mmol/L (ref 22–32)
Chloride: 100 mmol/L — ABNORMAL LOW (ref 101–111)
Creatinine, Ser: 0.92 mg/dL (ref 0.61–1.24)
Glucose, Bld: 157 mg/dL — ABNORMAL HIGH (ref 65–99)
POTASSIUM: 4.1 mmol/L (ref 3.5–5.1)
SODIUM: 137 mmol/L (ref 135–145)

## 2015-08-14 LAB — GLUCOSE, CAPILLARY: Glucose-Capillary: 151 mg/dL — ABNORMAL HIGH (ref 65–99)

## 2015-08-14 MED ORDER — CALCIUM CARBONATE 1250 (500 CA) MG PO TABS: 1.0000 | ORAL_TABLET | Freq: Two times a day (BID) | ORAL | Status: DC

## 2015-08-14 MED ORDER — SYNTHROID 100 MCG PO TABS: 100.0000 ug | ORAL_TABLET | Freq: Every day | ORAL | Status: DC

## 2015-08-14 MED ORDER — HYDROCODONE-ACETAMINOPHEN 5-325 MG PO TABS: 1.0000 | ORAL_TABLET | ORAL | Status: DC | PRN

## 2015-08-14 MED ORDER — INSULIN ASPART 100 UNIT/ML ~~LOC~~ SOLN: 0.0000 [IU] | Freq: Three times a day (TID) | SUBCUTANEOUS | Status: DC

## 2015-08-14 NOTE — Progress Notes (Signed)
Reviewed discharge information with patient and caregiver. Answered all questions. Patient/caregiver able to teach back medications and reasons to contact MD/911. Patient verbalizes importance of PCP follow up appointment.   

## 2015-08-14 NOTE — Discharge Summary (Signed)
Physician Discharge Summary [facility]  Patient ID: Joe Wallace MRN: [id number] DOB/AGE: [birth year] 54 y.o.  Admit date: 08/13/2015 Discharge date: 08/14/2015  Admission Diagnoses:  Thyroid neoplasm of uncertain behavior  Discharge Diagnoses:  Principal Problem:   Neoplasm of uncertain behavior of thyroid gland   Discharged Condition: good  Hospital Course: Patient was admitted for observation following thyroid surgery.  Post op course was uncomplicated.  Pain was well controlled.  Tolerated diet.  Post op calcium level on morning following surgery was 8.9 mg/dl.  Patient was prepared for discharge home on POD#1.  Consults: None  Treatments: surgery: total thyroidectomy  Discharge Exam: Blood pressure 114/67, pulse 73, temperature 97.8 F (36.6 C), temperature source Oral, resp. rate 16, height 5\' 9"  (1.753 m), weight 102.059 kg (225 lb), SpO2 96 %. HEENT - clear Neck - wound dry and intact; minimal STS; voice essentially normal Chest - clear bilaterally Cor - RRR  Family member (son) at bedside to translate this morning.  Disposition: Home  Discharge Instructions    Apply dressing    Complete by:  As directed   Apply light gauze dressing to wound before discharge home today.     Diet - low sodium heart healthy    Complete by:  As directed      Discharge instructions    Complete by:  As directed   [facility]  THYROID & PARATHYROID SURGERY:  POST-OP INSTRUCTIONS  Always review your discharge instruction sheet from the facility where your surgery was performed.  A prescription for pain medication may be given to you upon discharge.  Take your pain medication as prescribed.  If narcotic pain medicine is not needed, then you may take acetaminophen (Tylenol) or ibuprofen (Advil) as needed.  Take your usually prescribed medications unless otherwise directed.  If you need a refill on your pain medication, please contact your  pharmacy. They will contact our office to request authorization.  Prescriptions will not be processed by our office after 5 pm or on weekends.  Start with a light diet upon arrival home, such as soup and crackers or toast.  Be sure to drink plenty of fluids daily.  Resume your normal diet the day after surgery.  Most patients will experience some swelling and bruising on the chest and neck area.  Ice packs will help.  Swelling and bruising can take several days to resolve.   It is common to experience some constipation after surgery.  Increasing fluid intake and taking a stool softener will usually help or prevent this problem.  A mild laxative (Milk of Magnesia or Miralax) should be taken according to package directions if there has been no bowel movement after 48 hours.  You have steri-strips and a gauze dressing over your incision.  You may remove the gauze bandage on the second day after surgery, and you may shower at that time.  Leave your steri-strips (small skin tapes) in place directly over the incision.  These strips should remain on the skin for 5-7 days and then be removed.  You may get them wet in the shower and pat them dry.  You may resume regular (light) daily activities beginning the next day - such as daily self-care, walking, climbing stairs - gradually increasing activities as tolerated.  You may have sexual intercourse when it is comfortable.  Refrain from any heavy lifting or straining until approved by your doctor.  You may drive when you no longer are taking  prescription pain medication, you can comfortably wear a seatbelt, and you can safely maneuver your car and apply brakes.  You should see your doctor in the office for a follow-up appointment approximately two to three weeks after your surgery.  Make sure that you call for this appointment within a day or two after you arrive home to insure a convenient appointment time.  WHEN TO CALL YOUR DOCTOR: -- Fever greater than  101.5 -- Inability to urinate -- Nausea and/or vomiting - persistent -- Extreme swelling or bruising -- Continued bleeding from incision -- Increased pain, redness, or drainage from the incision -- Difficulty swallowing or breathing -- Muscle cramping or spasms -- Numbness or tingling in hands or around lips  The clinic staff is available to answer your questions during regular business hours.  Please don't hesitate to call and ask to speak to one of the nurses if you have concerns.  Earnstine Regal, MD, [facility] Office: [phone]  Website: [email or web address]     Increase activity slowly    Complete by:  As directed      Remove dressing in 24 hours    Complete by:  As directed             Medication List    TAKE these medications        ACCU-CHEK SOFTCLIX LANCETS lancets     acetaminophen 500 MG tablet  Commonly known as:  TYLENOL  Take 500 mg by mouth 2 (two) times daily.     atorvastatin 20 MG tablet  Commonly known as:  LIPITOR  Take 1 tablet (20 mg total) by mouth daily.     calcium carbonate 1250 (500 CA) MG tablet  Commonly known as:  OS-CAL - dosed in mg of elemental calcium  Take 1 tablet (500 mg of elemental calcium total) by mouth 2 (two) times daily with a meal.     glipiZIDE 10 MG 24 hr tablet  Commonly known as:  GLUCOTROL XL  Take 1 tablet (10 mg total) by mouth daily with breakfast.     HYDROcodone-acetaminophen 5-325 MG tablet  Commonly known as:  NORCO/VICODIN  Take 1-2 tablets by mouth every 4 (four) hours as needed for moderate pain.     meloxicam 7.5 MG tablet  Commonly known as:  MOBIC  Take 7.5 mg by mouth 2 (two) times daily.     metFORMIN 1000 MG tablet  Commonly known as:  GLUCOPHAGE  Take 1 tablet (1,000 mg total) by mouth 2 (two) times daily with a meal.     SYNTHROID 100 MCG tablet  Generic drug:  levothyroxine  Take 1 tablet (100 mcg total) by mouth daily.      traMADol 50 MG tablet  Commonly known as:  ULTRAM  Take 1 tablet twice a day as needed for pain           Follow-up Information    Follow up with Earnstine Regal, MD. Schedule an appointment as soon as possible for a visit in 3 weeks.   Specialty:  General Surgery   Why:  For wound re-check   Contact information:   [place] [id number] [phone]       Earnstine Regal, MD, [facility] Office: [phone]   Signed: Earnstine Regal 08/14/2015, 8:45 AM

## 2015-08-14 NOTE — Discharge Instructions (Signed)
[facility] ° °THYROID & PARATHYROID SURGERY:  POST-OP INSTRUCTIONS ° °Always review your discharge instruction sheet from the facility where your surgery was performed. ° °A prescription for pain medication may be given to you upon discharge.  Take your pain medication as prescribed.  If narcotic pain medicine is not needed, then you may take acetaminophen (Tylenol) or ibuprofen (Advil) as needed. ° °Take your usually prescribed medications unless otherwise directed. ° °If you need a refill on your pain medication, please contact your pharmacy. They will contact our office to request authorization.  Prescriptions will not be processed by our office after 5 pm or on weekends. ° °Start with a light diet upon arrival home, such as soup and crackers or toast.  Be sure to drink plenty of fluids daily.  Resume your normal diet the day after surgery. ° °Most patients will experience some swelling and bruising on the chest and neck area.  Ice packs will help.  Swelling and bruising can take several days to resolve.  ° °It is common to experience some constipation after surgery.  Increasing fluid intake and taking a stool softener will usually help or prevent this problem.  A mild laxative (Milk of Magnesia or Miralax) should be taken according to package directions if there has been no bowel movement after 48 hours. ° °You have steri-strips and a gauze dressing over your incision.  You may remove the gauze bandage on the second day after surgery, and you may shower at that time.  Leave your steri-strips (small skin tapes) in place directly over the incision.  These strips should remain on the skin for 5-7 days and then be removed.  You may get them wet in the shower and pat them dry. ° °You may resume regular (light) daily activities beginning the next day - such as daily self-care, walking, climbing stairs - gradually increasing activities as tolerated.  You may have sexual intercourse when it is  comfortable.  Refrain from any heavy lifting or straining until approved by your doctor.  You may drive when you no longer are taking prescription pain medication, you can comfortably wear a seatbelt, and you can safely maneuver your car and apply brakes. ° °You should see your doctor in the office for a follow-up appointment approximately two to three weeks after your surgery.  Make sure that you call for this appointment within a day or two after you arrive home to insure a convenient appointment time. ° °WHEN TO CALL YOUR DOCTOR: °-- Fever greater than 101.5 °-- Inability to urinate °-- Nausea and/or vomiting - persistent °-- Extreme swelling or bruising °-- Continued bleeding from incision °-- Increased pain, redness, or drainage from the incision °-- Difficulty swallowing or breathing °-- Muscle cramping or spasms °-- Numbness or tingling in hands or around lips ° °The clinic staff is available to answer your questions during regular business hours.  Please don’t hesitate to call and ask to speak to one of the nurses if you have concerns. ° °Kristopher Delk M. Shamal Stracener, MD, FACS °General & Endocrine Surgery °[facility] °Office: [phone] ° °Website: [email or web address] ° ° °

## 2015-08-16 ENCOUNTER — Other Ambulatory Visit: Payer: Self-pay | Admitting: *Deleted

## 2015-08-16 ENCOUNTER — Encounter (HOSPITAL_COMMUNITY): Payer: Self-pay | Admitting: Surgery

## 2015-08-16 DIAGNOSIS — M25512 Pain in left shoulder: Secondary | ICD-10-CM

## 2015-08-16 DIAGNOSIS — E119 Type 2 diabetes mellitus without complications: Secondary | ICD-10-CM

## 2015-08-16 DIAGNOSIS — E118 Type 2 diabetes mellitus with unspecified complications: Secondary | ICD-10-CM

## 2015-08-16 DIAGNOSIS — M5441 Lumbago with sciatica, right side: Secondary | ICD-10-CM

## 2015-08-16 DIAGNOSIS — M5442 Lumbago with sciatica, left side: Secondary | ICD-10-CM

## 2015-08-16 MED ORDER — GLIPIZIDE ER 10 MG PO TB24: 10.0000 mg | ORAL_TABLET | Freq: Every day | ORAL | Status: DC

## 2015-08-16 MED ORDER — TRAMADOL HCL 50 MG PO TABS: ORAL_TABLET | ORAL | Status: DC

## 2015-08-16 MED ORDER — METFORMIN HCL 1000 MG PO TABS: 1000.0000 mg | ORAL_TABLET | Freq: Two times a day (BID) | ORAL | Status: DC

## 2015-08-16 NOTE — Telephone Encounter (Signed)
Patients medications have been refilled.  Glipizide, Metformin, Tramadol

## 2015-09-01 DIAGNOSIS — Z09 Encounter for follow-up examination after completed treatment for conditions other than malignant neoplasm: Secondary | ICD-10-CM

## 2015-09-01 NOTE — Congregational Nurse Program (Signed)
Congregational Nurse Program Note  Date of Encounter: 09/01/2015  Past Medical History: Past Medical History  Diagnosis Date  . Diabetes mellitus without complication ([place])   . Hyperlipidemia   . Arthritis   . Thyroid neoplasm   . Visual disorder     LEFT EYE    Encounter Details:    BP 145/76 mmHg  Pulse 68  Resp 16  Glucose 145 non fasting  Patient came to the weekly clinic at the [facility] [address] to give information on recent surgery. New thyroid medication explained. Reminded patient to keep the post surgical consult even though he had results of biopsy had been given to him over the phone. He will come weekly for follow ups

## 2015-09-15 ENCOUNTER — Other Ambulatory Visit: Payer: Self-pay | Admitting: Internal Medicine

## 2015-09-15 DIAGNOSIS — C750 Malignant neoplasm of parathyroid gland: Secondary | ICD-10-CM

## 2015-09-26 DIAGNOSIS — C801 Malignant (primary) neoplasm, unspecified: Secondary | ICD-10-CM

## 2015-09-26 HISTORY — DX: Malignant (primary) neoplasm, unspecified: C80.1

## 2015-09-30 ENCOUNTER — Inpatient Hospital Stay: Admission: RE | Admit: 2015-09-30 | Payer: Medicaid Other | Source: Ambulatory Visit

## 2015-11-11 ENCOUNTER — Ambulatory Visit
Admission: RE | Admit: 2015-11-11 | Discharge: 2015-11-11 | Disposition: A | Discharge status: Home / Self Care | Payer: Medicaid Other | Source: Ambulatory Visit | Attending: Internal Medicine | Admitting: Internal Medicine

## 2015-11-11 DIAGNOSIS — C750 Malignant neoplasm of parathyroid gland: Secondary | ICD-10-CM

## 2016-03-03 ENCOUNTER — Ambulatory Visit: Payer: Medicaid Other | Attending: Family Medicine | Admitting: Family Medicine

## 2016-03-03 ENCOUNTER — Encounter: Payer: Self-pay | Admitting: Family Medicine

## 2016-03-03 VITALS — BP 129/82 | HR 73 | Temp 97.6°F | Resp 14 | Ht 68.0 in | Wt 219.0 lb

## 2016-03-03 DIAGNOSIS — E1140 Type 2 diabetes mellitus with diabetic neuropathy, unspecified: Secondary | ICD-10-CM | POA: Insufficient documentation

## 2016-03-03 DIAGNOSIS — E118 Type 2 diabetes mellitus with unspecified complications: Secondary | ICD-10-CM | POA: Diagnosis not present

## 2016-03-03 DIAGNOSIS — E1149 Type 2 diabetes mellitus with other diabetic neurological complication: Secondary | ICD-10-CM

## 2016-03-03 DIAGNOSIS — R0600 Dyspnea, unspecified: Secondary | ICD-10-CM | POA: Diagnosis not present

## 2016-03-03 DIAGNOSIS — M542 Cervicalgia: Secondary | ICD-10-CM

## 2016-03-03 LAB — GLUCOSE, POCT (MANUAL RESULT ENTRY): POC GLUCOSE: 135 mg/dL — AB (ref 70–99)

## 2016-03-03 LAB — POCT GLYCOSYLATED HEMOGLOBIN (HGB A1C): Hemoglobin A1C: 9.1

## 2016-03-03 MED ORDER — GLIPIZIDE ER 10 MG PO TB24: 20.0000 mg | ORAL_TABLET | Freq: Every day | ORAL | Status: DC

## 2016-03-03 MED ORDER — ALBUTEROL SULFATE HFA 108 (90 BASE) MCG/ACT IN AERS
2.0000 | INHALATION_SPRAY | Freq: Four times a day (QID) | RESPIRATORY_TRACT | Status: DC | PRN
Start: 2016-03-03 — End: 2018-01-29

## 2016-03-03 MED ORDER — GABAPENTIN 300 MG PO CAPS: 300.0000 mg | ORAL_CAPSULE | Freq: Three times a day (TID) | ORAL | Status: DC

## 2016-03-03 MED ORDER — MELOXICAM 7.5 MG PO TABS: 7.5000 mg | ORAL_TABLET | Freq: Every day | ORAL | Status: DC

## 2016-03-03 MED FILL — MELOXICAM 7.5 MG TABLET: 7.5 | 30 days supply | Qty: 30 | Fill #0

## 2016-03-03 MED FILL — PROAIR HFA 90 MCG INHALER: 108 (90 BAS | 25 days supply | Qty: 9 | Fill #0

## 2016-03-03 MED FILL — GABAPENTIN 300 MG CAPSULE: 300 | 30 days supply | Qty: 90 | Fill #0

## 2016-03-03 NOTE — Patient Instructions (Signed)

## 2016-03-03 NOTE — Progress Notes (Signed)
Pt here for difficulty breathing  Short of breath past 3-4 days Bilateral shoulder pain He took his medications this morning at 3 am Needs refill on all diabetes medications and aspirin   CBG 135 A1C 9.1

## 2016-03-03 NOTE — Progress Notes (Signed)
Subjective:  Patient ID: Joe Wallace, male    DOB: 1961-01-13  Age: 55 y.o. MRN: [id number]  CC: Shortness of Breath and Shoulder Pain   HPI Joe Wallace  55 year old male with a history of type 2 diabetes mellitus (A1c 9.1), hypertension, status post total thyroidectomy for papillary thyroid cancer who comes into the clinic for an acute visit.  He complains of a ten-day history of tingling and numbness in his right hand and shortness of breath of 3-4 day duration and he also has some neck pain worse on turning of his head from left-to-right. Review of his chart indicates he does have a history of adhesive capsulitis and was supposed to be on meloxicam which he has not been taking. He denies wheezing, nasal congestion, sinus tenderness, chest pains.  He informs me he has all his diabetic medications and does not need refills.  Outpatient Prescriptions Prior to Visit  Medication Sig Dispense Refill  . ACCU-CHEK SOFTCLIX LANCETS lancets   0  . glipiZIDE (GLUCOTROL XL) 10 MG 24 hr tablet Take 1 tablet (10 mg total) by mouth daily with breakfast. 90 tablet 3  . metFORMIN (GLUCOPHAGE) 1000 MG tablet Take 1 tablet (1,000 mg total) by mouth 2 (two) times daily with a meal. 180 tablet 3  . SYNTHROID 100 MCG tablet Take 1 tablet (100 mcg total) by mouth daily. 30 tablet 3  . traMADol (ULTRAM) 50 MG tablet Take 1 tablet twice a day as needed for pain 60 tablet 1  . acetaminophen (TYLENOL) 500 MG tablet Take 500 mg by mouth 2 (two) times daily. Reported on 03/03/2016    . atorvastatin (LIPITOR) 20 MG tablet Take 1 tablet (20 mg total) by mouth daily. (Patient not taking: Reported on 03/03/2016) 90 tablet 3  . calcium carbonate (OS-CAL - DOSED IN MG OF ELEMENTAL CALCIUM) 1250 (500 CA) MG tablet Take 1 tablet (500 mg of elemental calcium total) by mouth 2 (two) times daily with a meal. (Patient not taking: Reported on 03/03/2016) 30 tablet 0  . HYDROcodone-acetaminophen (NORCO/VICODIN) 5-325 MG tablet  Take 1-2 tablets by mouth every 4 (four) hours as needed for moderate pain. (Patient not taking: Reported on 03/03/2016) 20 tablet 0  . meloxicam (MOBIC) 7.5 MG tablet Take 7.5 mg by mouth 2 (two) times daily. Reported on 03/03/2016     No facility-administered medications prior to visit.    ROS Review of Systems  Constitutional: Negative for activity change and appetite change.  HENT: Negative for sinus pressure and sore throat.   Eyes: Negative for visual disturbance.  Respiratory: Negative for cough, chest tightness and shortness of breath.   Cardiovascular: Negative for chest pain and leg swelling.  Gastrointestinal: Negative for abdominal pain, diarrhea, constipation and abdominal distention.  Endocrine: Negative.   Genitourinary: Negative for dysuria.  Musculoskeletal:       See hpi  Skin: Negative for rash.  Allergic/Immunologic: Negative.   Neurological: Positive for numbness. Negative for weakness and light-headedness.  Psychiatric/Behavioral: Negative for suicidal ideas and dysphoric mood.    Objective:  BP 129/82 mmHg  Pulse 73  Temp(Src) 97.6 F (36.4 C) (Oral)  Resp 14  Ht 5\' 8"  (1.727 m)  Wt 219 lb (99.338 kg)  BMI 33.31 kg/m2  SpO2 96%  BP/Weight 03/03/2016 09/01/2015 [id number]  Systolic BP [id number]  Diastolic BP 82 76 82  Wt. (Lbs) 219 - 225  BMI 33.31 - 33.21      Physical Exam  Constitutional: He is  oriented to person, place, and time. He appears well-developed and well-nourished.  Cardiovascular: Normal rate, normal heart sounds and intact distal pulses.   No murmur heard. Pulmonary/Chest: Effort normal and breath sounds normal. He has no wheezes. He has no rales. He exhibits no tenderness.  Abdominal: Soft. Bowel sounds are normal. He exhibits no distension and no mass. There is no tenderness.  Musculoskeletal: He exhibits tenderness (tenderness to palpation and range of motion of sternocleidomastoid muscles bilaterally).  Neurological: He is alert  and oriented to person, place, and time.     Lab Results  Component Value Date   HGBA1C 9.1 03/03/2016     Assessment & Plan:   1. Other diabetic neurological complication associated with type 2 diabetes mellitus ([place]) Uncontrolled with A1c of 9.1 Increase glipizide to 20 mg and he will follow-up with PCP for review of blood sugars. 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  Dispense: 90 capsule; Refill: 1   2. Neck pain Could be positional Advised to apply heat; placed on NSAIDs and right need a muscle relaxant if symptoms persist. - meloxicam (MOBIC) 7.5 MG tablet; Take 1 tablet (7.5 mg total) by mouth daily. Reported on 03/03/2016  Dispense: 30 tablet; Refill: 1  3. Dyspnea Lungs are clear Trial of MDI - albuterol (PROVENTIL HFA;VENTOLIN HFA) 108 (90 Base) MCG/ACT inhaler; Inhale 2 puffs into the lungs every 6 (six) hours as needed for wheezing or shortness of breath.  Dispense: 1 Inhaler; Refill: 0   Meds ordered this encounter  Medications  . meloxicam (MOBIC) 7.5 MG tablet    Sig: Take 1 tablet (7.5 mg total) by mouth daily. Reported on 03/03/2016    Dispense:  30 tablet    Refill:  1  . gabapentin (NEURONTIN) 300 MG capsule    Sig: Take 1 capsule (300 mg total) by mouth 3 (three) times daily.    Dispense:  90 capsule    Refill:  1  . albuterol (PROVENTIL HFA;VENTOLIN HFA) 108 (90 Base) MCG/ACT inhaler    Sig: Inhale 2 puffs into the lungs every 6 (six) hours as needed for wheezing or shortness of breath.    Dispense:  1 Inhaler    Refill:  0    Follow-up: Return in about 3 weeks (around 03/24/2016) for Follow-up of diabetes mellitus with PCP.Marland Kitchen   Arnoldo Morale MD

## 2016-06-19 ENCOUNTER — Other Ambulatory Visit: Payer: Self-pay | Admitting: Internal Medicine

## 2016-06-19 ENCOUNTER — Telehealth: Payer: Self-pay | Admitting: Internal Medicine

## 2016-06-19 DIAGNOSIS — E118 Type 2 diabetes mellitus with unspecified complications: Secondary | ICD-10-CM

## 2016-06-19 DIAGNOSIS — M25512 Pain in left shoulder: Secondary | ICD-10-CM

## 2016-06-19 MED ORDER — TRAMADOL HCL 50 MG PO TABS: ORAL_TABLET | ORAL | 1 refills | Status: DC

## 2016-06-19 MED FILL — glipiZIDE XL 10 MG TB24: 10 | 30 days supply | Qty: 60 | Fill #0

## 2016-06-19 MED FILL — MELOXICAM 7.5 MG TABLET: 7.5 | 30 days supply | Qty: 30 | Fill #1

## 2016-06-19 MED FILL — GABAPENTIN 300 MG CAPSULE: 300 | 30 days supply | Qty: 90 | Fill #1

## 2016-06-19 NOTE — Telephone Encounter (Signed)
Patient needs a refill for tramadol

## 2016-06-29 ENCOUNTER — Ambulatory Visit: Payer: Medicaid Other | Attending: Internal Medicine | Admitting: Internal Medicine

## 2016-06-29 ENCOUNTER — Encounter: Payer: Self-pay | Admitting: Internal Medicine

## 2016-06-29 VITALS — BP 147/77 | HR 78 | Temp 98.4°F | Resp 18 | Ht 70.0 in | Wt 240.2 lb

## 2016-06-29 DIAGNOSIS — M25511 Pain in right shoulder: Secondary | ICD-10-CM | POA: Diagnosis not present

## 2016-06-29 DIAGNOSIS — Z85850 Personal history of malignant neoplasm of thyroid: Secondary | ICD-10-CM | POA: Insufficient documentation

## 2016-06-29 DIAGNOSIS — G4733 Obstructive sleep apnea (adult) (pediatric): Secondary | ICD-10-CM | POA: Insufficient documentation

## 2016-06-29 DIAGNOSIS — Z7984 Long term (current) use of oral hypoglycemic drugs: Secondary | ICD-10-CM | POA: Diagnosis not present

## 2016-06-29 DIAGNOSIS — E1142 Type 2 diabetes mellitus with diabetic polyneuropathy: Secondary | ICD-10-CM | POA: Insufficient documentation

## 2016-06-29 DIAGNOSIS — R21 Rash and other nonspecific skin eruption: Secondary | ICD-10-CM | POA: Diagnosis not present

## 2016-06-29 DIAGNOSIS — E785 Hyperlipidemia, unspecified: Secondary | ICD-10-CM | POA: Insufficient documentation

## 2016-06-29 DIAGNOSIS — I10 Essential (primary) hypertension: Secondary | ICD-10-CM | POA: Insufficient documentation

## 2016-06-29 DIAGNOSIS — Z7982 Long term (current) use of aspirin: Secondary | ICD-10-CM | POA: Diagnosis not present

## 2016-06-29 DIAGNOSIS — E119 Type 2 diabetes mellitus without complications: Secondary | ICD-10-CM

## 2016-06-29 DIAGNOSIS — G8929 Other chronic pain: Secondary | ICD-10-CM | POA: Insufficient documentation

## 2016-06-29 DIAGNOSIS — L738 Other specified follicular disorders: Secondary | ICD-10-CM

## 2016-06-29 DIAGNOSIS — E890 Postprocedural hypothyroidism: Secondary | ICD-10-CM | POA: Diagnosis not present

## 2016-06-29 LAB — TSH: TSH: 39.11 m[IU]/L — AB (ref 0.40–4.50)

## 2016-06-29 LAB — COMPLETE METABOLIC PANEL WITH GFR
ALBUMIN: 4.1 g/dL (ref 3.6–5.1)
ALK PHOS: 64 U/L (ref 40–115)
ALT: 36 U/L (ref 9–46)
AST: 38 U/L — AB (ref 10–35)
BILIRUBIN TOTAL: 0.4 mg/dL (ref 0.2–1.2)
BUN: 16 mg/dL (ref 7–25)
CO2: 26 mmol/L (ref 20–31)
Calcium: 9.3 mg/dL (ref 8.6–10.3)
Chloride: 102 mmol/L (ref 98–110)
Creat: 1.24 mg/dL (ref 0.70–1.33)
GFR, Est African American: 74 mL/min (ref >=60)
GFR, Est Non African American: 64 mL/min (ref >=60)
GLUCOSE: 219 mg/dL — AB (ref 65–99)
Potassium: 4.1 mmol/L (ref 3.5–5.3)
SODIUM: 139 mmol/L (ref 135–146)
TOTAL PROTEIN: 6.7 g/dL (ref 6.1–8.1)

## 2016-06-29 LAB — LIPID PANEL
Cholesterol: 290 mg/dL — ABNORMAL HIGH (ref 125–200)
HDL: 41 mg/dL (ref >=40)
LDL Cholesterol: 193 mg/dL — ABNORMAL HIGH (ref <130)
Total CHOL/HDL Ratio: 7.1 Ratio — ABNORMAL HIGH (ref <=5.0)
Triglycerides: 278 mg/dL — ABNORMAL HIGH (ref <150)
VLDL: 56 mg/dL — ABNORMAL HIGH (ref <30)

## 2016-06-29 LAB — GLUCOSE, POCT (MANUAL RESULT ENTRY): POC GLUCOSE: 217 mg/dL — AB (ref 70–99)

## 2016-06-29 LAB — POCT GLYCOSYLATED HEMOGLOBIN (HGB A1C): HEMOGLOBIN A1C: 9

## 2016-06-29 MED ORDER — METFORMIN HCL 1000 MG PO TABS: 1000.0000 mg | ORAL_TABLET | Freq: Two times a day (BID) | ORAL | 3 refills | Status: DC

## 2016-06-29 MED ORDER — GLIPIZIDE ER 10 MG PO TB24: 10.0000 mg | ORAL_TABLET | Freq: Every day | ORAL | 3 refills | Status: DC

## 2016-06-29 MED ORDER — TRIAMCINOLONE ACETONIDE 0.5 % EX OINT: 1.0000 "application " | TOPICAL_OINTMENT | Freq: Two times a day (BID) | CUTANEOUS | 1 refills | Status: DC

## 2016-06-29 MED ORDER — GABAPENTIN 300 MG PO CAPS: 300.0000 mg | ORAL_CAPSULE | Freq: Three times a day (TID) | ORAL | 3 refills | Status: DC

## 2016-06-29 MED ORDER — SYNTHROID 100 MCG PO TABS: 100.0000 ug | ORAL_TABLET | Freq: Every day | ORAL | 3 refills | Status: DC

## 2016-06-29 MED ORDER — ATORVASTATIN CALCIUM 20 MG PO TABS: 20.0000 mg | ORAL_TABLET | Freq: Every day | ORAL | 3 refills | Status: DC

## 2016-06-29 MED FILL — metFORMIN HCL 1000 MG TABS: 1000 | 30 days supply | Qty: 60 | Fill #0

## 2016-06-29 MED FILL — SYNTHROID 100 MCG TABLET: 100 | 30 days supply | Qty: 30 | Fill #0

## 2016-06-29 MED FILL — TRIAMCINOLONE 0.5% OINTMENT: 0.5 | 15 days supply | Qty: 30 | Fill #0

## 2016-06-29 MED FILL — ATORVASTATIN 20 MG TABLET: 20 | 30 days supply | Qty: 30 | Fill #0

## 2016-06-29 NOTE — Patient Instructions (Signed)
Diabetes and Foot Care Diabetes may cause you to have problems because of poor blood supply (circulation) to your feet and legs. This may cause the skin on your feet to become thinner, break easier, and heal more slowly. Your skin may become dry, and the skin may peel and crack. You may also have nerve damage in your legs and feet causing decreased feeling in them. You may not notice minor injuries to your feet that could lead to infections or more serious problems. Taking care of your feet is one of the most important things you can do for yourself.  HOME CARE INSTRUCTIONS  Wear shoes at all times, even in the house. Do not go barefoot. Bare feet are easily injured.  Check your feet daily for blisters, cuts, and redness. If you cannot see the bottom of your feet, use a mirror or ask someone for help.  Wash your feet with warm water (do not use hot water) and mild soap. Then pat your feet and the areas between your toes until they are completely dry. Do not soak your feet as this can dry your skin.  Apply a moisturizing lotion or petroleum jelly (that does not contain alcohol and is unscented) to the skin on your feet and to dry, brittle toenails. Do not apply lotion between your toes.  Trim your toenails straight across. Do not dig under them or around the cuticle. File the edges of your nails with an emery board or nail file.  Do not cut corns or calluses or try to remove them with medicine.  Wear clean socks or stockings every day. Make sure they are not too tight. Do not wear knee-high stockings since they may decrease blood flow to your legs.  Wear shoes that fit properly and have enough cushioning. To break in new shoes, wear them for just a few hours a day. This prevents you from injuring your feet. Always look in your shoes before you put them on to be sure there are no objects inside.  Do not cross your legs. This may decrease the blood flow to your feet.  If you find a minor scrape,  cut, or break in the skin on your feet, keep it and the skin around it clean and dry. These areas may be cleansed with mild soap and water. Do not cleanse the area with peroxide, alcohol, or iodine.  When you remove an adhesive bandage, be sure not to damage the skin around it.  If you have a wound, look at it several times a day to make sure it is healing.  Do not use heating pads or hot water bottles. They may burn your skin. If you have lost feeling in your feet or legs, you may not know it is happening until it is too late.  Make sure your health care provider performs a complete foot exam at least annually or more often if you have foot problems. Report any cuts, sores, or bruises to your health care provider immediately. SEEK MEDICAL CARE IF:   You have an injury that is not healing.  You have cuts or breaks in the skin.  You have an ingrown nail.  You notice redness on your legs or feet.  You feel burning or tingling in your legs or feet.  You have pain or cramps in your legs and feet.  Your legs or feet are numb.  Your feet always feel cold. SEEK IMMEDIATE MEDICAL CARE IF:   There is increasing redness,   swelling, or pain in or around a wound.  There is a red line that goes up your leg.  Pus is coming from a wound.  You develop a fever or as directed by your health care provider.  You notice a bad smell coming from an ulcer or wound.   This information is not intended to replace advice given to you by your health care provider. Make sure you discuss any questions you have with your health care provider.   Document Released: 09/08/2000 Document Revised: 05/14/2013 Document Reviewed: 02/18/2013 Elsevier Interactive Patient Education 2016 Elsevier [employer]. Basic Carbohydrate Counting for Diabetes Mellitus Carbohydrate counting is a method for keeping track of the amount of carbohydrates you eat. Eating carbohydrates naturally increases the level of sugar (glucose) in your  blood, so it is important for you to know the amount that is okay for you to have in every meal. Carbohydrate counting helps keep the level of glucose in your blood within normal limits. The amount of carbohydrates allowed is different for every person. A dietitian can help you calculate the amount that is right for you. Once you know the amount of carbohydrates you can have, you can count the carbohydrates in the foods you want to eat. Carbohydrates are found in the following foods:  Grains, such as breads and cereals.  Dried beans and soy products.  Starchy vegetables, such as potatoes, peas, and corn.  Fruit and fruit juices.  Milk and yogurt.  Sweets and snack foods, such as cake, cookies, candy, chips, soft drinks, and fruit drinks. CARBOHYDRATE COUNTING There are two ways to count the carbohydrates in your food. You can use either of the methods or a combination of both. Reading the "Nutrition Facts" on Packaged Food The "Nutrition Facts" is an area that is included on the labels of almost all packaged food and beverages in the [place]. It includes the serving size of that food or beverage and information about the nutrients in each serving of the food, including the grams (g) of carbohydrate per serving.  Decide the number of servings of this food or beverage that you will be able to eat or drink. Multiply that number of servings by the number of grams of carbohydrate that is listed on the label for that serving. The total will be the amount of carbohydrates you will be having when you eat or drink this food or beverage. Learning Standard Serving Sizes of Food When you eat food that is not packaged or does not include "Nutrition Facts" on the label, you need to measure the servings in order to count the amount of carbohydrates.A serving of most carbohydrate-rich foods contains about 15 g of carbohydrates. The following list includes serving sizes of carbohydrate-rich foods that  provide 15 g ofcarbohydrate per serving:   1 slice of bread (1 oz) or 1 six-inch tortilla.    of a hamburger bun or English muffin.  4-6 crackers.   cup unsweetened dry cereal.    cup hot cereal.   cup rice or pasta.    cup mashed potatoes or  of a large baked potato.  1 cup fresh fruit or one small piece of fruit.    cup canned or frozen fruit or fruit juice.  1 cup milk.   cup plain fat-free yogurt or yogurt sweetened with artificial sweeteners.   cup cooked dried beans or starchy vegetable, such as peas, corn, or potatoes.  Decide the number of standard-size servings that you will eat. Multiply that   number of servings by 15 (the grams of carbohydrates in that serving). For example, if you eat 2 cups of strawberries, you will have eaten 2 servings and 30 g of carbohydrates (2 servings x 15 g = 30 g). For foods such as soups and casseroles, in which more than one food is mixed in, you will need to count the carbohydrates in each food that is included. EXAMPLE OF CARBOHYDRATE COUNTING Sample Dinner  3 oz chicken breast.   cup of brown rice.   cup of corn.  1 cup milk.   1 cup strawberries with sugar-free whipped topping.  Carbohydrate Calculation Step 1: Identify the foods that contain carbohydrates:   Rice.   Corn.   Milk.   Strawberries. Step 2:Calculate the number of servings eaten of each:   2 servings of rice.   1 serving of corn.   1 serving of milk.   1 serving of strawberries. Step 3: Multiply each of those number of servings by 15 g:   2 servings of rice x 15 g = 30 g.   1 serving of corn x 15 g = 15 g.   1 serving of milk x 15 g = 15 g.   1 serving of strawberries x 15 g = 15 g. Step 4: Add together all of the amounts to find the total grams of carbohydrates eaten: 30 g + 15 g + 15 g + 15 g = 75 g.   This information is not intended to replace advice given to you by your health care provider. Make sure you  discuss any questions you have with your health care provider.   Document Released: 09/11/2005 Document Revised: 10/02/2014 Document Reviewed: 08/08/2013 Elsevier Interactive Patient Education [employer].

## 2016-06-29 NOTE — Progress Notes (Signed)
Patient is here for medications  Patient has taken medication today and patient has eaten today.  Patient declined the flu vaccine today.  Patient complains of finger and toe numbness being present over the past 3 months.

## 2016-06-29 NOTE — Progress Notes (Signed)
Joe Wallace, is a 55 y.o. male  [id number]  [id number]  DOB - [birth year]  Chief Complaint  Patient presents with  . Medication Refill      Subjective:   Joe Wallace is a 55 y.o. male with history of diabetes mellitus, hypertension, status post total thyroidectomy for papillary thyroid cancer here today for a follow up visit and medication refill. Patient is complaining of tingling and numbness in his right hand, chronic, present for months. He also has right and left neck, and right shoulder pain, that is chronic. Patient has No headache, No chest pain, No abdominal pain - No Nausea, No new weakness tingling or numbness, No Cough - SOB. Patient admits that he snores loudly and may have apnea, never had sleep study done. He also have a rash on his scalp, itchy, chronic, not responding to OTC creams.  Problem  Barbers' Rash  Postoperative Hypothyroidism    ALLERGIES: No Known Allergies  PAST MEDICAL HISTORY: Past Medical History:  Diagnosis Date  . Arthritis   . Diabetes mellitus without complication ([place])   . Hyperlipidemia   . Thyroid neoplasm   . Visual disorder    LEFT EYE    MEDICATIONS AT HOME: Prior to Admission medications   Medication Sig Start Date End Date Taking? Authorizing Provider  ACCU-CHEK SOFTCLIX LANCETS lancets  01/01/15  Yes Historical Provider, MD  acetaminophen (TYLENOL) 500 MG tablet Take 500 mg by mouth 2 (two) times daily. Reported on 03/03/2016   Yes Historical Provider, MD  albuterol (PROVENTIL HFA;VENTOLIN HFA) 108 (90 Base) MCG/ACT inhaler Inhale 2 puffs into the lungs every 6 (six) hours as needed for wheezing or shortness of breath. 03/03/16  Yes Arnoldo Morale, MD  aspirin 81 MG tablet Take 81 mg by mouth daily.   Yes Historical Provider, MD  gabapentin (NEURONTIN) 300 MG capsule Take 1 capsule (300 mg total) by mouth 3 (three) times daily. 06/29/16  Yes Tresa Garter, MD  glipiZIDE (GLUCOTROL XL) 10 MG 24 hr tablet Take 1 tablet (10  mg total) by mouth daily with breakfast. 06/29/16  Yes Koty Anctil E Doreene Burke, MD  meloxicam (MOBIC) 7.5 MG tablet Take 1 tablet (7.5 mg total) by mouth daily. Reported on 03/03/2016 03/03/16  Yes Arnoldo Morale, MD  SYNTHROID 100 MCG tablet Take 1 tablet (100 mcg total) by mouth daily. 06/29/16  Yes Tresa Garter, MD  traMADol (ULTRAM) 50 MG tablet Take 1 tablet twice a day as needed for pain 06/19/16  Yes Artrell Lawless E Doreene Burke, MD  atorvastatin (LIPITOR) 20 MG tablet Take 1 tablet (20 mg total) by mouth daily. 06/29/16   Tresa Garter, MD  calcium carbonate (OS-CAL - DOSED IN MG OF ELEMENTAL CALCIUM) 1250 (500 CA) MG tablet Take 1 tablet (500 mg of elemental calcium total) by mouth 2 (two) times daily with a meal. Patient not taking: Reported on 06/29/2016 08/14/15   Greer Pickerel, MD  metFORMIN (GLUCOPHAGE) 1000 MG tablet Take 1 tablet (1,000 mg total) by mouth 2 (two) times daily with a meal. 06/29/16   Tresa Garter, MD  triamcinolone ointment (KENALOG) 0.5 % Apply 1 application topically 2 (two) times daily. 06/29/16   Tresa Garter, MD    Objective:   Vitals:   06/29/16 1200  BP: (!) 147/77  Pulse: 78  Resp: 18  Temp: 98.4 F (36.9 C)  TempSrc: Oral  SpO2: 96%  Weight: 240 lb 3.2 oz (109 kg)  Height: 5\' 10"  (1.778 m)   Exam General  appearance : Awake, alert, not in any distress. Speech Clear. Not toxic looking, obese HEENT: Atraumatic and Normocephalic, pupils equally reactive to light and accomodation Neck: Supple, no JVD. No cervical lymphadenopathy.  Chest: Good air entry bilaterally, no added sounds  CVS: S1 S2 regular, no murmurs.  Abdomen: Bowel sounds present, Non tender and not distended with no gaurding, rigidity or rebound. Extremities: B/L Lower Ext shows no edema, both legs are warm to touch Neurology: Awake alert, and oriented X 3, CN II-XII intact, Non focal Skin: Barber's Rash  Data Review Lab Results  Component Value Date   HGBA1C 9.0 06/29/2016    HGBA1C 9.1 03/03/2016   HGBA1C 11.0 06/07/2015    Assessment & Plan   1. Type 2 diabetes mellitus with diabetic polyneuropathy, without long-term current use of insulin (HCC)  - POCT A1C is 9% today, no significant change from previous HbA1C. Will continue current meds but intensify diet and Exercise. - Glucose (CBG) - Microalbumin/Creatinine Ratio, Urine  - metFORMIN (GLUCOPHAGE) 1000 MG tablet; Take 1 tablet (1,000 mg total) by mouth 2 (two) times daily with a meal.  Dispense: 180 tablet; Refill: 3 - glipiZIDE (GLUCOTROL XL) 10 MG 24 hr tablet; Take 1 tablet (10 mg total) by mouth daily with breakfast.  Dispense: 90 tablet; Refill: 3 Increase - gabapentin (NEURONTIN) 300 MG capsule; Take 1 capsule (300 mg total) by mouth 3 (three) times daily.  Dispense: 90 capsule; Refill: 3  - COMPLETE METABOLIC PANEL WITH GFR - TSH - Urinalysis, Complete  2. Morbid obesity (HCC)  - VITAMIN D 25 Hydroxy (Vit-D Deficiency, Fractures)  3. Dyslipidemia  - atorvastatin (LIPITOR) 20 MG tablet; Take 1 tablet (20 mg total) by mouth daily.  Dispense: 90 tablet; Refill: 3 - Lipid panel  To address this please limit saturated fat to no more than 7% of your calories, limit cholesterol to 200 mg/day, increase fiber and exercise as tolerated. If needed we may add another cholesterol lowering medication to your regimen.   4. Barbers' rash  - triamcinolone ointment (KENALOG) 0.5 %; Apply 1 application topically 2 (two) times daily.  Dispense: 60 g; Refill: 1  5. Postoperative hypothyroidism  - SYNTHROID 100 MCG tablet; Take 1 tablet (100 mcg total) by mouth daily.  Dispense: 30 tablet; Refill: 3  6. OSA (obstructive sleep apnea)  - Split night study; Future  Patient have been counseled extensively about nutrition and exercise. Other issues discussed during this visit include: low cholesterol diet, weight control and daily exercise, foot care, annual eye examinations at Ophthalmology, importance of  adherence with medications and regular follow-up. We also discussed long term complications of uncontrolled diabetes and hypertension.   Return in about 3 months (around 09/29/2016) for Hemoglobin A1C and Follow up, DM, Follow up HTN, Follow up Pain and comorbidities.  Interpreter was used to communicate directly with patient for the entire encounter including providing detailed patient instructions.   The patient was given clear instructions to go to ER or return to medical center if symptoms don't improve, worsen or new problems develop. The patient verbalized understanding. The patient was told to call to get lab results if they haven't heard anything in the next week.   This note has been created with Surveyor, quantity. Any transcriptional errors are unintentional.    Angelica Chessman, MD, [place], [place], [place], [place] and [place], [place]   06/29/2016, 12:14 PM

## 2016-06-30 LAB — VITAMIN D 25 HYDROXY (VIT D DEFICIENCY, FRACTURES): Vit D, 25-Hydroxy: 17 ng/mL — ABNORMAL LOW (ref 30–100)

## 2016-06-30 LAB — URINALYSIS, COMPLETE
BACTERIA UA: NONE SEEN [HPF]
Bilirubin Urine: NEGATIVE
CASTS: NONE SEEN [LPF]
Crystals: NONE SEEN [HPF]
Glucose, UA: NEGATIVE
HGB URINE DIPSTICK: NEGATIVE
Ketones, ur: NEGATIVE
Leukocytes, UA: NEGATIVE
NITRITE: NEGATIVE
PH: 5.5 (ref 5.0–8.0)
PROTEIN: NEGATIVE
RBC / HPF: NONE SEEN RBC/HPF (ref <=2)
SQUAMOUS EPITHELIAL / LPF: NONE SEEN [HPF] (ref <=5)
Specific Gravity, Urine: 1.009 (ref 1.001–1.035)
WBC, UA: NONE SEEN WBC/HPF (ref <=5)
YEAST: NONE SEEN [HPF]

## 2016-06-30 LAB — MICROALBUMIN / CREATININE URINE RATIO
Creatinine, Urine: 83 mg/dL (ref 20–370)
Microalb Creat Ratio: 30 mcg/mg creat — ABNORMAL HIGH (ref <30)
Microalb, Ur: 2.5 mg/dL

## 2016-07-07 ENCOUNTER — Other Ambulatory Visit: Payer: Self-pay | Admitting: Internal Medicine

## 2016-07-07 MED FILL — ACCU-CHEK AVIVA PLUS TEST S: 50 days supply | Qty: 100 | Fill #0

## 2016-07-07 MED FILL — ACCU-CHEK SOFTCLIX LANCETS: 50 days supply | Qty: 100 | Fill #0

## 2016-07-07 NOTE — Telephone Encounter (Signed)
Patient came to the office to request medication refill for ACCU-CHEK SOFTCLIX LANCETS lancets. Please call it in to our pharmacy [facility]).   Thank you

## 2016-07-09 ENCOUNTER — Other Ambulatory Visit: Payer: Self-pay | Admitting: Internal Medicine

## 2016-07-09 DIAGNOSIS — E890 Postprocedural hypothyroidism: Secondary | ICD-10-CM

## 2016-07-09 DIAGNOSIS — E785 Hyperlipidemia, unspecified: Secondary | ICD-10-CM

## 2016-07-09 DIAGNOSIS — E559 Vitamin D deficiency, unspecified: Secondary | ICD-10-CM

## 2016-07-09 MED ORDER — ATORVASTATIN CALCIUM 40 MG PO TABS: 40.0000 mg | ORAL_TABLET | Freq: Every day | ORAL | 3 refills | Status: DC

## 2016-07-09 MED ORDER — SYNTHROID 100 MCG PO TABS: 150.0000 ug | ORAL_TABLET | Freq: Every day | ORAL | 3 refills | Status: DC

## 2016-07-09 MED ORDER — VITAMIN D (ERGOCALCIFEROL) 1.25 MG (50000 UNIT) PO CAPS: 50000.0000 [IU] | ORAL_CAPSULE | ORAL | 3 refills | Status: DC

## 2016-07-10 MED FILL — VIT D2 1.25 MG (50,000 UNIT: 1.25 MG | 27 days supply | Qty: 4 | Fill #0

## 2016-07-10 MED FILL — ATORVASTATIN 40 MG TABLET: 40 | 30 days supply | Qty: 30 | Fill #0

## 2016-07-10 MED FILL — SYNTHROID 100 MCG TABLET: 100 | 30 days supply | Qty: 45 | Fill #0

## 2016-07-11 ENCOUNTER — Telehealth: Payer: Self-pay | Admitting: *Deleted

## 2016-07-11 ENCOUNTER — Telehealth: Payer: Self-pay | Admitting: Internal Medicine

## 2016-07-11 NOTE — Telephone Encounter (Signed)
-----   Message from Tresa Garter, MD sent at 07/09/2016  3:00 PM EDT ----- Please inform patient that his lab results showed: 1. Low vitamin D level, needs replacement. Prescribed to the pharmacy 2. Cholesterol is very high, will increase his cholesterol medication to 40 mg, start taking 2 tablets daily of the current 20 mg Lipitor. 3. Thyroid function is very low, instruct patient to take one and half tablets of current thyroid medication (making total of 150 mcg instead of 100 mcg currently).

## 2016-07-11 NOTE — Telephone Encounter (Signed)
Medical Assistant used [employer] to contact patient.  Interpreter Name: Mauri Pole Interpreter #: [id number] Patient was not available, [employer] left patient a voicemail. Voicemail states to give a call back to [place] with [facility] at [phone].

## 2016-07-11 NOTE — Telephone Encounter (Signed)
Patient called the office regarding a missed called from [place]. Informed patient that nurse called with interpreter on the line to give him lab results. Please follow up. Patient stated that if he didn't answer to please leave a message in Arabic with lab results.  Thank you

## 2016-07-21 NOTE — Telephone Encounter (Signed)
Medical Assistant used [employer] to contact patient.  Interpreter Name: Mariana Kaufman #: [id number] Patient is aware of Vitamin D level being low and replacement being sent to the [facility] pharmacy. Patient is also aware of cholesterol being high and patient needing to increase Lipitor to 40 mg which is two tablets daily. Patient is aware of thyroid level being very low and patient is instructed to increase thyroid medication to one and a half tablets which equals 150 mcg. Patient expressed his understanding and had no further questions at this time.

## 2016-08-15 ENCOUNTER — Ambulatory Visit (HOSPITAL_BASED_OUTPATIENT_CLINIC_OR_DEPARTMENT_OTHER): Payer: Medicaid Other | Attending: Internal Medicine

## 2016-08-18 ENCOUNTER — Encounter (HOSPITAL_COMMUNITY): Payer: Self-pay | Admitting: *Deleted

## 2016-08-18 ENCOUNTER — Ambulatory Visit (HOSPITAL_COMMUNITY)
Admission: EM | Admit: 2016-08-18 | Discharge: 2016-08-18 | Disposition: A | Discharge status: Home / Self Care | Payer: Medicaid Other | Attending: Internal Medicine | Admitting: Internal Medicine

## 2016-08-18 DIAGNOSIS — S39012A Strain of muscle, fascia and tendon of lower back, initial encounter: Secondary | ICD-10-CM

## 2016-08-18 DIAGNOSIS — S39011A Strain of muscle, fascia and tendon of abdomen, initial encounter: Secondary | ICD-10-CM

## 2016-08-18 MED ORDER — DICLOFENAC SODIUM 1 % TD GEL: 2.0000 g | Freq: Four times a day (QID) | TRANSDERMAL | 0 refills | Status: AC

## 2016-08-18 MED ORDER — CYCLOBENZAPRINE HCL 10 MG PO TABS: 10.0000 mg | ORAL_TABLET | Freq: Every evening | ORAL | 0 refills | Status: DC | PRN

## 2016-08-18 NOTE — Discharge Instructions (Addendum)
Prescription for voltaren gel (anti inflammatory, related to ibuprofen) sent to the [place] at [facility] and [employer].  Prescription for muscle relaxer to take at bed time also sent to the pharmacy.  Ice for 5-10 minutes a couple times daily may also help decrease pain.  Anticipate gradual improvement in back/abdominal wall pain over the next 3-4 weeks.

## 2016-08-18 NOTE — ED Triage Notes (Signed)
Pt  Was  Lifting  A  Heavy  Tire       5  Days  Ago  And  Developed  Pain in  His  Back  And  Lower  abd        Pt  Reports  Pain  Is  Worse  On movement  And  posistion    And is  Having  Problems  When he  Sleeps   He  Ambulated  With a  Slow   Steady  Gait

## 2016-08-18 NOTE — ED Provider Notes (Signed)
[place]    CSN: [id number] Arrival date & time: 08/18/16  1422     History   Chief Complaint Chief Complaint  Patient presents with  . Back Pain    HPI Joe Wallace is a 55 y.o. male. He presents today with pain in his right lateral low back and right lower abdomen, after picking up a tire with his right arm 4 days ago. No bulge, no lump. No bruise, no rash. Able to walk independently into the urgent care, and climb on/off the exam table. Pain is worse with movement, and is keeping him from sleeping well at night. No weakness or clumsiness in the legs, no change in bowels or bladder. He has been taking ibuprofen 400 mg twice a day, without relief. He is interested in a topical medicine.  HPI  Past Medical History:  Diagnosis Date  . Arthritis   . Diabetes mellitus without complication ([place])   . Hyperlipidemia   . Thyroid neoplasm   . Visual disorder    LEFT EYE    Patient Active Problem List   Diagnosis Date Noted  . Barbers' rash 06/29/2016  . 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 Diabetic neuropathy ([place]) 03/03/2016  . Neoplasm of uncertain behavior of thyroid gland 08/12/2015  . Adhesive capsulitis of left shoulder 06/07/2015  . Multiple thyroid nodules 12/31/2014  . Left shoulder pain 12/31/2014  . Type 2 diabetes mellitus without complication ([place]) 0000000  . Furunculosis of skin 12/31/2014  . Dyslipidemia 12/31/2014  . Type II or unspecified type diabetes mellitus without mention of complication, not stated as uncontrolled 02/04/2014  . Knee pain, right 11/18/2013  . Right knee pain 08/18/2013  . Low back pain 08/18/2013  . Morbid obesity ([place]) 08/18/2013    Past Surgical History:  Procedure Laterality Date  . SHOULDER ARTHROSCOPY    . THYROIDECTOMY N/A 08/13/2015   Procedure: TOTAL THYROIDECTOMY;  Surgeon: Armandina Gemma, MD;  Location: [facility];  Service: General;  Laterality: N/A;       Home Medications    Prior to Admission  medications   Medication Sig Start Date End Date Taking? Authorizing Provider  ACCU-CHEK AVIVA PLUS test strip USE AS INSTRUCTED 07/07/16   Tresa Garter, MD  ACCU-CHEK SOFTCLIX LANCETS lancets  01/01/15   Historical Provider, MD  ACCU-CHEK SOFTCLIX LANCETS lancets USE AS INSTRUCTED 07/07/16   Tresa Garter, MD  acetaminophen (TYLENOL) 500 MG tablet Take 500 mg by mouth 2 (two) times daily. Reported on 03/03/2016    Historical Provider, MD  albuterol (PROVENTIL HFA;VENTOLIN HFA) 108 (90 Base) MCG/ACT inhaler Inhale 2 puffs into the lungs every 6 (six) hours as needed for wheezing or shortness of breath. 03/03/16   Arnoldo Morale, MD  aspirin 81 MG tablet Take 81 mg by mouth daily.    Historical Provider, MD  atorvastatin (LIPITOR) 40 MG tablet Take 1 tablet (40 mg total) by mouth daily. 07/09/16   Tresa Garter, MD  calcium carbonate (OS-CAL - DOSED IN MG OF ELEMENTAL CALCIUM) 1250 (500 CA) MG tablet Take 1 tablet (500 mg of elemental calcium total) by mouth 2 (two) times daily with a meal. Patient not taking: Reported on 06/29/2016 08/14/15   Greer Pickerel, MD  cyclobenzaprine (FLEXERIL) 10 MG tablet Take 1 tablet (10 mg total) by mouth at bedtime as needed for muscle spasms. 08/18/16   Sherlene Shams, MD  diclofenac sodium (VOLTAREN) 1 % GEL Apply 2 g topically 4 (four) times daily. 08/18/16 08/31/16  Sherlene Shams, MD  gabapentin (NEURONTIN) 300 MG capsule Take 1 capsule (300 mg total) by mouth 3 (three) times daily. 06/29/16   Tresa Garter, MD  glipiZIDE (GLUCOTROL XL) 10 MG 24 hr tablet Take 1 tablet (10 mg total) by mouth daily with breakfast. 06/29/16   Tresa Garter, MD  meloxicam (MOBIC) 7.5 MG tablet Take 1 tablet (7.5 mg total) by mouth daily. Reported on 03/03/2016 03/03/16   Arnoldo Morale, MD  metFORMIN (GLUCOPHAGE) 1000 MG tablet Take 1 tablet (1,000 mg total) by mouth 2 (two) times daily with a meal. 06/29/16   Olugbemiga E Doreene Burke, MD  SYNTHROID 100 MCG tablet Take 1.5  tablets (150 mcg total) by mouth daily. 07/09/16   Tresa Garter, MD  traMADol (ULTRAM) 50 MG tablet Take 1 tablet twice a day as needed for pain 06/19/16   Tresa Garter, MD  triamcinolone ointment (KENALOG) 0.5 % Apply 1 application topically 2 (two) times daily. 06/29/16   Tresa Garter, MD  Vitamin D, Ergocalciferol, (DRISDOL) 50000 units CAPS capsule Take 1 capsule (50,000 Units total) by mouth every 7 (seven) days. 07/09/16   Tresa Garter, MD    Family History Family History  Problem Relation Age of Onset  . Diabetes Mother   . Hypertension Mother   . Hypertension Father   . Diabetes Father     Social History Social History  Substance Use Topics  . Smoking status: Never Smoker  . Smokeless tobacco: Never Used  . Alcohol use No     Allergies   Patient has no known allergies.   Review of Systems Review of Systems  All other systems reviewed and are negative.    Physical Exam Triage Vital Signs ED Triage Vitals  Enc Vitals Group     BP 08/18/16 1525 132/85     Pulse Rate 08/18/16 1525 78     Resp 08/18/16 1525 18     Temp 08/18/16 1525 98.6 F (37 C)     Temp Source 08/18/16 1525 Oral     SpO2 08/18/16 1525 100 %     Weight --      Height --      Pain Score 08/18/16 1524 9   Updated Vital Signs BP 132/85 (BP Location: Right Arm)   Pulse 78   Temp 98.6 F (37 C) (Oral)   Resp 18   SpO2 100%  Physical Exam  Constitutional: He is oriented to person, place, and time. No distress.  Alert, nicely groomed  HENT:  Head: Atraumatic.  Eyes:  Conjugate gaze, no eye redness/drainage  Neck: Neck supple.  Cardiovascular: Normal rate.   Pulmonary/Chest: No respiratory distress.  Lungs clear, symmetric breath sounds  Abdominal: Soft. He exhibits no distension. There is no guarding.  Tender as on diagram. No bruise, no bulge  Musculoskeletal: Normal range of motion.       Arms: Tender as on diagram  Neurological: He is alert and  oriented to person, place, and time.  Able to climb on/off exam table independently, walk in the urgent care independently. No limp, no foot drop, does walk slowly.  Skin: Skin is warm and dry.  No cyanosis  Nursing note and vitals reviewed.    UC Treatments / Results   Procedures Procedures (including critical care time) None today  Final Clinical Impressions(s) / UC Diagnoses   Final diagnoses:  Strain of lumbar region, initial encounter  Abdominal wall strain, initial encounter   Prescription for voltaren  gel (anti inflammatory, related to ibuprofen) sent to the [place] [facility] and [employer].  Prescription for muscle relaxer to take at bed time also sent to the pharmacy.  Ice for 5-10 minutes a couple times daily may also help decrease pain.  Anticipate gradual improvement in back/abdominal wall pain over the next 3-4 weeks.    New Prescriptions New Prescriptions   CYCLOBENZAPRINE (FLEXERIL) 10 MG TABLET    Take 1 tablet (10 mg total) by mouth at bedtime as needed for muscle spasms.   DICLOFENAC SODIUM (VOLTAREN) 1 % GEL    Apply 2 g topically 4 (four) times daily.     Sherlene Shams, MD 08/18/16 [phone]

## 2016-09-01 ENCOUNTER — Ambulatory Visit: Payer: Medicaid Other

## 2016-09-07 ENCOUNTER — Ambulatory Visit: Payer: Medicaid Other | Admitting: Internal Medicine

## 2016-09-13 ENCOUNTER — Ambulatory Visit: Payer: Medicaid Other | Attending: Internal Medicine | Admitting: *Deleted

## 2016-09-13 DIAGNOSIS — Z23 Encounter for immunization: Secondary | ICD-10-CM | POA: Diagnosis not present

## 2016-09-21 ENCOUNTER — Encounter: Payer: Self-pay | Admitting: Internal Medicine

## 2016-09-21 ENCOUNTER — Ambulatory Visit: Payer: Medicaid Other | Attending: Internal Medicine | Admitting: Internal Medicine

## 2016-09-21 VITALS — BP 110/70 | HR 65 | Temp 97.4°F | Resp 18 | Ht 69.0 in | Wt 225.4 lb

## 2016-09-21 DIAGNOSIS — I10 Essential (primary) hypertension: Secondary | ICD-10-CM | POA: Insufficient documentation

## 2016-09-21 DIAGNOSIS — M25559 Pain in unspecified hip: Secondary | ICD-10-CM | POA: Diagnosis present

## 2016-09-21 DIAGNOSIS — E1142 Type 2 diabetes mellitus with diabetic polyneuropathy: Secondary | ICD-10-CM

## 2016-09-21 DIAGNOSIS — E890 Postprocedural hypothyroidism: Secondary | ICD-10-CM | POA: Insufficient documentation

## 2016-09-21 DIAGNOSIS — Z7982 Long term (current) use of aspirin: Secondary | ICD-10-CM | POA: Diagnosis not present

## 2016-09-21 DIAGNOSIS — R05 Cough: Secondary | ICD-10-CM | POA: Diagnosis not present

## 2016-09-21 DIAGNOSIS — Z85850 Personal history of malignant neoplasm of thyroid: Secondary | ICD-10-CM | POA: Insufficient documentation

## 2016-09-21 DIAGNOSIS — Z7984 Long term (current) use of oral hypoglycemic drugs: Secondary | ICD-10-CM | POA: Insufficient documentation

## 2016-09-21 DIAGNOSIS — E119 Type 2 diabetes mellitus without complications: Secondary | ICD-10-CM

## 2016-09-21 DIAGNOSIS — M542 Cervicalgia: Secondary | ICD-10-CM | POA: Diagnosis not present

## 2016-09-21 DIAGNOSIS — Z79899 Other long term (current) drug therapy: Secondary | ICD-10-CM | POA: Diagnosis not present

## 2016-09-21 DIAGNOSIS — E785 Hyperlipidemia, unspecified: Secondary | ICD-10-CM

## 2016-09-21 LAB — GLUCOSE, POCT (MANUAL RESULT ENTRY): POC Glucose: 178 mg/dl — AB (ref 70–99)

## 2016-09-21 LAB — POCT GLYCOSYLATED HEMOGLOBIN (HGB A1C): Hemoglobin A1C: 8.5

## 2016-09-21 MED ORDER — GABAPENTIN 300 MG PO CAPS: 300.0000 mg | ORAL_CAPSULE | Freq: Three times a day (TID) | ORAL | 3 refills | Status: DC

## 2016-09-21 MED ORDER — LEVOTHYROXINE SODIUM 150 MCG PO TABS: 150.0000 ug | ORAL_TABLET | Freq: Every day | ORAL | 3 refills | Status: DC

## 2016-09-21 MED ORDER — METFORMIN HCL 1000 MG PO TABS: 1000.0000 mg | ORAL_TABLET | Freq: Two times a day (BID) | ORAL | 3 refills | Status: DC

## 2016-09-21 MED ORDER — GLIPIZIDE ER 10 MG PO TB24: 10.0000 mg | ORAL_TABLET | Freq: Every day | ORAL | 3 refills | Status: DC

## 2016-09-21 MED ORDER — GUAIFENESIN-DM 100-10 MG/5ML PO SYRP: 5.0000 mL | ORAL_SOLUTION | ORAL | 0 refills | Status: DC | PRN

## 2016-09-21 MED ORDER — AZITHROMYCIN 250 MG PO TABS: ORAL_TABLET | ORAL | 0 refills | Status: DC

## 2016-09-21 MED ORDER — ATORVASTATIN CALCIUM 40 MG PO TABS: 40.0000 mg | ORAL_TABLET | Freq: Every day | ORAL | 3 refills | Status: DC

## 2016-09-21 MED ORDER — MELOXICAM 7.5 MG PO TABS: 7.5000 mg | ORAL_TABLET | Freq: Every day | ORAL | 1 refills | Status: DC

## 2016-09-21 NOTE — Progress Notes (Signed)
Patient is her for pain in his Hip.  Patient also stated that he has cold SX for 3 days.

## 2016-09-21 NOTE — Progress Notes (Signed)
Joe Wallace, is a 55 y.o. male  [id number]  [id number]  DOB - [birth year]  Chief Complaint  Patient presents with  . Hip Pain      Subjective:   Joe Wallace is a 55 y.o. male with history of diabetes mellitus, hypertension, status post total thyroidectomy for papillary thyroid cancer here today for a follow up visit and medication refill. Patient complained of cold symptoms, with some cough and running nose. Cough is mild, no fever, no chest pain. :ast HbA1C was 9.0%, patient claims adherent to medications and diet. HbA1C today is 8.5%. No hypoglycemic episode. Patient has No headache, No chest pain, No abdominal pain - No Nausea, No new weakness tingling or numbness.  Problem  Neck Pain  Type 2 Diabetes Mellitus With Diabetic Polyneuropathy, Without Long-Term Current Use of Insulin (Hcc)    ALLERGIES: No Known Allergies  PAST MEDICAL HISTORY: Past Medical History:  Diagnosis Date  . Arthritis   . Diabetes mellitus without complication ([place])   . Hyperlipidemia   . Thyroid neoplasm   . Visual disorder    LEFT EYE    MEDICATIONS AT HOME: Prior to Admission medications   Medication Sig Start Date End Date Taking? Authorizing Provider  aspirin 81 MG tablet Take 81 mg by mouth daily.   Yes Historical Provider, MD  atorvastatin (LIPITOR) 40 MG tablet Take 1 tablet (40 mg total) by mouth daily. 09/21/16  Yes Tresa Garter, MD  gabapentin (NEURONTIN) 300 MG capsule Take 1 capsule (300 mg total) by mouth 3 (three) times daily. 09/21/16  Yes Tresa Garter, MD  glipiZIDE (GLUCOTROL XL) 10 MG 24 hr tablet Take 1 tablet (10 mg total) by mouth daily with breakfast. 09/21/16  Yes Quamir Willemsen E Doreene Burke, MD  meloxicam (MOBIC) 7.5 MG tablet Take 1 tablet (7.5 mg total) by mouth daily. Reported on 03/03/2016 09/21/16  Yes Tresa Garter, MD  metFORMIN (GLUCOPHAGE) 1000 MG tablet Take 1 tablet (1,000 mg total) by mouth 2 (two) times daily with a meal. 09/21/16  Yes  Tresa Garter, MD  Vitamin D, Ergocalciferol, (DRISDOL) 50000 units CAPS capsule Take 1 capsule (50,000 Units total) by mouth every 7 (seven) days. 07/09/16  Yes Tresa Garter, MD  ACCU-CHEK AVIVA PLUS test strip USE AS INSTRUCTED 07/07/16   Tresa Garter, MD  ACCU-CHEK SOFTCLIX LANCETS lancets  01/01/15   Historical Provider, MD  ACCU-CHEK SOFTCLIX LANCETS lancets USE AS INSTRUCTED 07/07/16   Tresa Garter, MD  albuterol (PROVENTIL HFA;VENTOLIN HFA) 108 (90 Base) MCG/ACT inhaler Inhale 2 puffs into the lungs every 6 (six) hours as needed for wheezing or shortness of breath. 03/03/16   Arnoldo Morale, MD  levothyroxine (SYNTHROID, LEVOTHROID) 150 MCG tablet Take 1 tablet (150 mcg total) by mouth daily. 09/21/16   Tresa Garter, MD  triamcinolone ointment (KENALOG) 0.5 % Apply 1 application topically 2 (two) times daily. 06/29/16   Tresa Garter, MD    Objective:   Vitals:   09/21/16 1424  BP: 110/70  Pulse: 65  Resp: 18  Temp: 97.4 F (36.3 C)  TempSrc: Oral  SpO2: 99%  Weight: 225 lb 6.4 oz (102.2 kg)  Height: 5\' 9"  (1.753 m)   Exam General appearance : Awake, alert, not in any distress. Speech Clear. Not toxic looking HEENT: Atraumatic and Normocephalic, pupils equally reactive to light and accomodation Neck: Supple, no JVD. No cervical lymphadenopathy.  Chest: Good air entry bilaterally, no added sounds  CVS: S1 S2 regular, no  murmurs.  Abdomen: Bowel sounds present, Non tender and not distended with no gaurding, rigidity or rebound. Extremities: B/L Lower Ext shows no edema, both legs are warm to touch Neurology: Awake alert, and oriented X 3, CN II-XII intact, Non focal Skin: No Rash  Data Review Lab Results  Component Value Date   HGBA1C 8.5 09/21/2016   HGBA1C 9.0 06/29/2016   HGBA1C 9.1 03/03/2016    Assessment & Plan   1. Type 2 diabetes mellitus with diabetic polyneuropathy, without long-term current use of insulin (HCC)  - POCT  A1C - Glucose (CBG) Refill - metFORMIN (GLUCOPHAGE) 1000 MG tablet; Take 1 tablet (1,000 mg total) by mouth 2 (two) times daily with a meal.  Dispense: 180 tablet; Refill: 3 - glipiZIDE (GLUCOTROL XL) 10 MG 24 hr tablet; Take 1 tablet (10 mg total) by mouth daily with breakfast.  Dispense: 90 tablet; Refill: 3 - gabapentin (NEURONTIN) 300 MG capsule; Take 1 capsule (300 mg total) by mouth 3 (three) times daily.  Dispense: 270 capsule; Refill: 3  2. Neck pain  - meloxicam (MOBIC) 7.5 MG tablet; Take 1 tablet (7.5 mg total) by mouth daily. Reported on 03/03/2016  Dispense: 30 tablet; Refill: 1  3. Dyslipidemia  - atorvastatin (LIPITOR) 40 MG tablet; Take 1 tablet (40 mg total) by mouth daily.  Dispense: 90 tablet; Refill: 3  To address this please limit saturated fat to no more than 7% of your calories, limit cholesterol to 200 mg/day, increase fiber and exercise as tolerated. If needed we may add another cholesterol lowering medication to your regimen.   4. Postoperative hypothyroidism Refill - levothyroxine (SYNTHROID, LEVOTHROID) 150 MCG tablet; Take 1 tablet (150 mcg total) by mouth daily.  Dispense: 90 tablet; Refill: 3  Patient have been counseled extensively about nutrition and exercise. Other issues discussed during this visit include: low cholesterol diet, weight control and daily exercise, foot care, annual eye examinations at Ophthalmology, importance of adherence with medications and regular follow-up. We also discussed long term complications of uncontrolled diabetes and hypertension.   Return in about 3 months (around 12/20/2016) for Hemoglobin A1C and Follow up, DM, Follow up HTN, Hypothyroidism.   Interpreter was used to communicate directly with patient for the entire encounter including providing detailed patient instructions.   The patient was given clear instructions to go to ER or return to medical center if symptoms don't improve, worsen or new problems develop. The  patient verbalized understanding. The patient was told to call to get lab results if they haven't heard anything in the next week.   This note has been created with Surveyor, quantity. Any transcriptional errors are unintentional.    Angelica Chessman, MD, [place], [place], [place], [place] and [place], [place]   09/21/2016, 3:31 PM

## 2017-02-20 DIAGNOSIS — H1013 Acute atopic conjunctivitis, bilateral: Secondary | ICD-10-CM | POA: Diagnosis not present

## 2017-02-20 DIAGNOSIS — H538 Other visual disturbances: Secondary | ICD-10-CM | POA: Diagnosis not present

## 2017-02-20 DIAGNOSIS — E119 Type 2 diabetes mellitus without complications: Secondary | ICD-10-CM | POA: Diagnosis not present

## 2017-03-28 IMAGING — DX DG CHEST 2V
2 series · 2 of 2 positions shown · non-contrast
Comparison: None.

CLINICAL DATA: 54-year-old male with cough, headache and fever for
3 days.

EXAM:
CHEST  2 VIEW

[w chest pa]
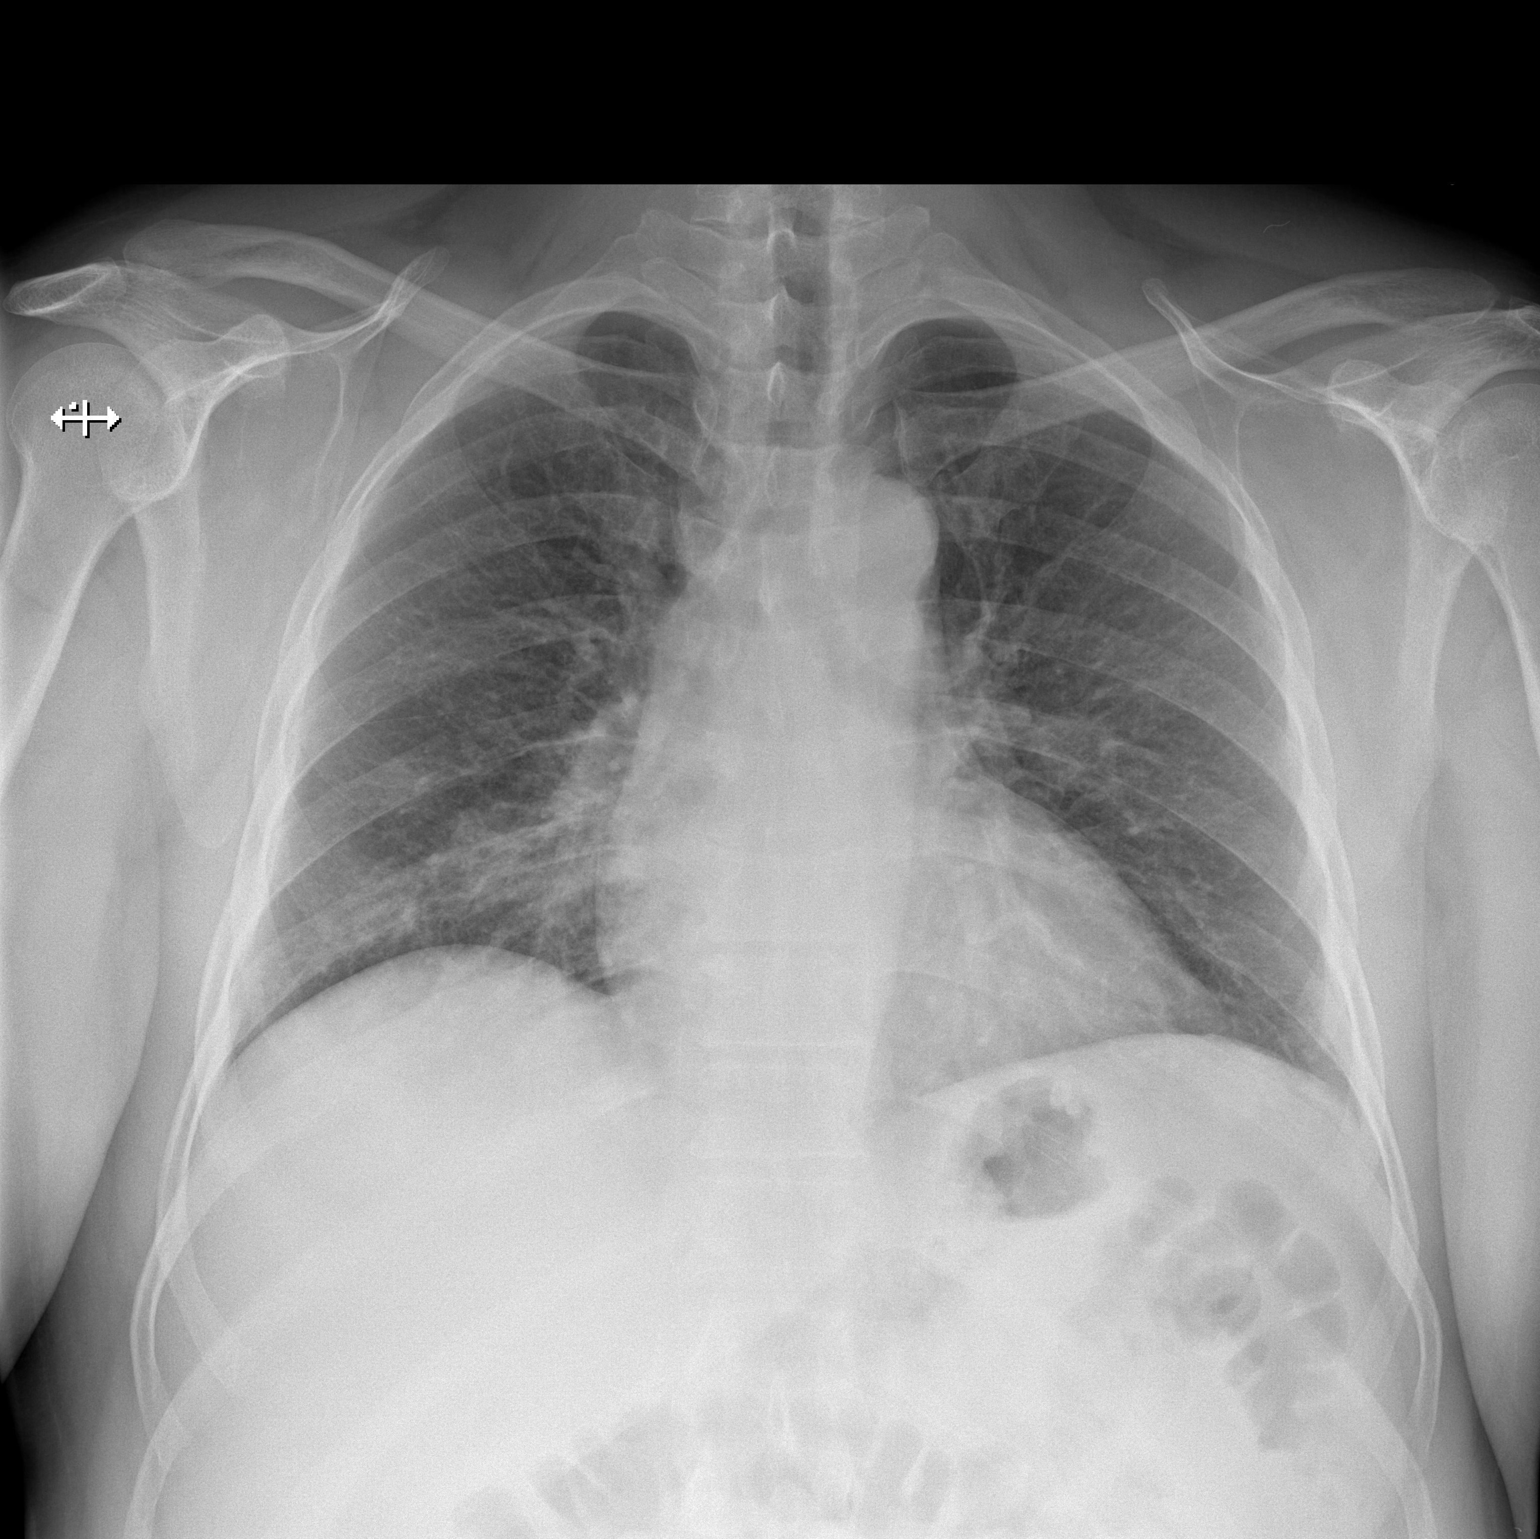

[w chest lat]
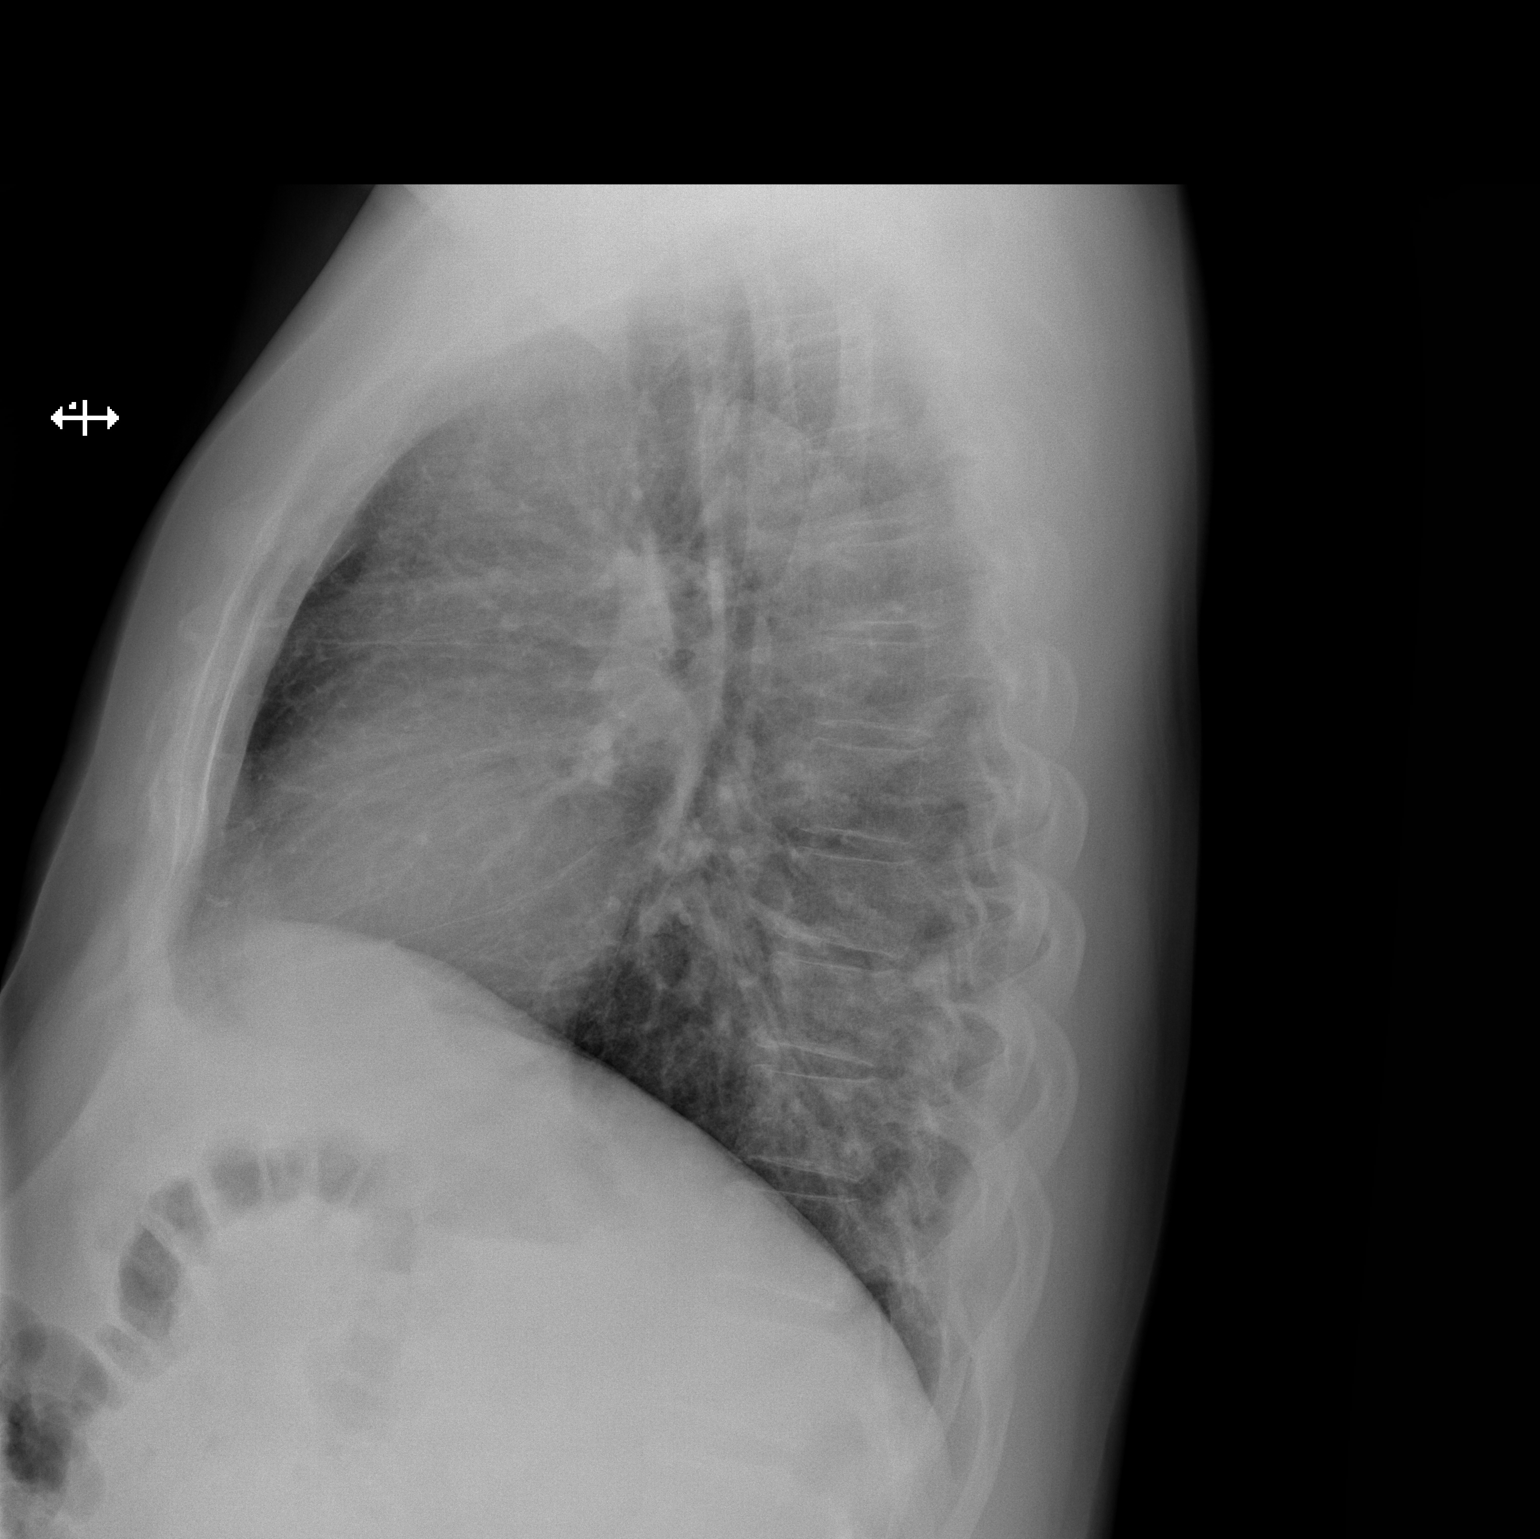

[2 of 2 positions shown; findings below may reference images not displayed]

FINDINGS: Upper limits normal heart size noted.

Peribronchial thickening is present.

Patchy right lower lobe opacity is suspicious for pneumonia.

There is no evidence of pleural effusion, pneumothorax or pulmonary
mass.

No acute bony abnormalities are identified.
IMPRESSION: Patchy right lower lobe opacity suspicious for pneumonia.
Radiographic follow-up to resolution is recommended.

## 2017-04-02 MED FILL — glipiZIDE ER 10 MG TB24: 10 | 30 days supply | Qty: 30 | Fill #0

## 2017-04-06 ENCOUNTER — Other Ambulatory Visit: Payer: Self-pay | Admitting: Internal Medicine

## 2017-04-25 ENCOUNTER — Ambulatory Visit: Payer: Medicaid Other | Attending: Internal Medicine | Admitting: Internal Medicine

## 2017-04-25 ENCOUNTER — Encounter: Payer: Self-pay | Admitting: Internal Medicine

## 2017-04-25 VITALS — BP 112/74 | HR 73 | Temp 98.6°F | Resp 16 | Wt 229.2 lb

## 2017-04-25 DIAGNOSIS — G8929 Other chronic pain: Secondary | ICD-10-CM

## 2017-04-25 DIAGNOSIS — M25511 Pain in right shoulder: Secondary | ICD-10-CM | POA: Diagnosis not present

## 2017-04-25 DIAGNOSIS — E1142 Type 2 diabetes mellitus with diabetic polyneuropathy: Secondary | ICD-10-CM

## 2017-04-25 DIAGNOSIS — Z7982 Long term (current) use of aspirin: Secondary | ICD-10-CM | POA: Insufficient documentation

## 2017-04-25 DIAGNOSIS — Z7984 Long term (current) use of oral hypoglycemic drugs: Secondary | ICD-10-CM | POA: Diagnosis not present

## 2017-04-25 DIAGNOSIS — E785 Hyperlipidemia, unspecified: Secondary | ICD-10-CM | POA: Diagnosis not present

## 2017-04-25 DIAGNOSIS — E119 Type 2 diabetes mellitus without complications: Secondary | ICD-10-CM | POA: Diagnosis not present

## 2017-04-25 DIAGNOSIS — Z85850 Personal history of malignant neoplasm of thyroid: Secondary | ICD-10-CM | POA: Diagnosis not present

## 2017-04-25 DIAGNOSIS — R21 Rash and other nonspecific skin eruption: Secondary | ICD-10-CM | POA: Insufficient documentation

## 2017-04-25 DIAGNOSIS — E890 Postprocedural hypothyroidism: Secondary | ICD-10-CM

## 2017-04-25 DIAGNOSIS — I10 Essential (primary) hypertension: Secondary | ICD-10-CM | POA: Insufficient documentation

## 2017-04-25 DIAGNOSIS — Z6833 Body mass index (BMI) 33.0-33.9, adult: Secondary | ICD-10-CM | POA: Diagnosis not present

## 2017-04-25 DIAGNOSIS — L738 Other specified follicular disorders: Secondary | ICD-10-CM | POA: Diagnosis not present

## 2017-04-25 LAB — POCT GLYCOSYLATED HEMOGLOBIN (HGB A1C): HEMOGLOBIN A1C: 10.1

## 2017-04-25 LAB — GLUCOSE, POCT (MANUAL RESULT ENTRY): POC GLUCOSE: 218 mg/dL — AB (ref 70–99)

## 2017-04-25 MED ORDER — METFORMIN HCL 1000 MG PO TABS: 1000.0000 mg | ORAL_TABLET | Freq: Two times a day (BID) | ORAL | 3 refills | Status: DC

## 2017-04-25 MED ORDER — GABAPENTIN 300 MG PO CAPS: 300.0000 mg | ORAL_CAPSULE | Freq: Three times a day (TID) | ORAL | 3 refills | Status: DC

## 2017-04-25 MED ORDER — TRIAMCINOLONE ACETONIDE 0.5 % EX OINT: 1.0000 "application " | TOPICAL_OINTMENT | Freq: Two times a day (BID) | CUTANEOUS | 1 refills | Status: DC

## 2017-04-25 MED ORDER — TRAMADOL HCL 50 MG PO TABS: 50.0000 mg | ORAL_TABLET | Freq: Two times a day (BID) | ORAL | 1 refills | Status: DC | PRN

## 2017-04-25 MED ORDER — GLIPIZIDE ER 10 MG PO TB24: 10.0000 mg | ORAL_TABLET | Freq: Every day | ORAL | 3 refills | Status: DC

## 2017-04-25 MED ORDER — ATORVASTATIN CALCIUM 40 MG PO TABS: 40.0000 mg | ORAL_TABLET | Freq: Every day | ORAL | 3 refills | Status: DC

## 2017-04-25 MED ORDER — LEVOTHYROXINE SODIUM 150 MCG PO TABS: 150.0000 ug | ORAL_TABLET | Freq: Every day | ORAL | 3 refills | Status: DC

## 2017-04-25 NOTE — Progress Notes (Signed)
Joe Wallace, is a 56 y.o. male  ASN:[id number]  [id number]  DOB - [birth year]  Chief Complaint  Patient presents with  . Hair/Scalp Problem  . Arm Pain       Subjective:   Joe Wallace is a 56 y.o. male  with history of diabetes mellitus, hypertension, status post total thyroidectomy for papillary thyroid cancer here today for a follow up visit and medication refill. His major complaint today is hair loss and rash on his scalp, chronic, ongoing for over a year. Also complaining of ongoing pain in his shoulders, he had surgery on the left with some relief but the right shoulder is giving him so much pain and discomfort. He however does not want any more surgery. He denies being depressed, denies any suicidal ideation or thoughts. Patient has not been completely adherent with medications saying he ran out recently and did not come to pick up his refills. Patient has No headache, No chest pain, No abdominal pain - No Nausea, No new weakness tingling or numbness, No Cough - SOB.  No problems updated.  ALLERGIES: No Known Allergies  PAST MEDICAL HISTORY: Past Medical History:  Diagnosis Date  . Arthritis   . Diabetes mellitus without complication ([place])   . Hyperlipidemia   . Thyroid neoplasm   . Visual disorder    LEFT EYE    MEDICATIONS AT HOME: Prior to Admission medications   Medication Sig Start Date End Date Taking? Authorizing Provider  ACCU-CHEK AVIVA PLUS test strip USE AS INSTRUCTED 07/07/16   Tresa Garter, MD  ACCU-CHEK SOFTCLIX LANCETS lancets  01/01/15   [provider]  ACCU-CHEK SOFTCLIX LANCETS lancets USE AS INSTRUCTED 07/07/16   Tresa Garter, MD  albuterol (PROVENTIL HFA;VENTOLIN HFA) 108 (90 Base) MCG/ACT inhaler Inhale 2 puffs into the lungs every 6 (six) hours as needed for wheezing or shortness of breath. 03/03/16   Arnoldo Morale, MD  aspirin 81 MG tablet Take 81 mg by mouth daily.    [provider]  atorvastatin (LIPITOR)  40 MG tablet Take 1 tablet (40 mg total) by mouth daily. 04/25/17   Tresa Garter, MD  gabapentin (NEURONTIN) 300 MG capsule Take 1 capsule (300 mg total) by mouth 3 (three) times daily. 04/25/17   Tresa Garter, MD  glipiZIDE (GLUCOTROL XL) 10 MG 24 hr tablet Take 1 tablet (10 mg total) by mouth daily with breakfast. 04/25/17   Tresa Garter, MD  levothyroxine (SYNTHROID, LEVOTHROID) 150 MCG tablet Take 1 tablet (150 mcg total) by mouth daily. 04/25/17   Tresa Garter, MD  metFORMIN (GLUCOPHAGE) 1000 MG tablet Take 1 tablet (1,000 mg total) by mouth 2 (two) times daily with a meal. 04/25/17   Aurelia Gras, Marlena Clipper, MD  traMADol (ULTRAM) 50 MG tablet Take 1 tablet (50 mg total) by mouth every 12 (twelve) hours as needed. 04/25/17   Tresa Garter, MD  triamcinolone ointment (KENALOG) 0.5 % Apply 1 application topically 2 (two) times daily. 04/25/17   Tresa Garter, MD  Vitamin D, Ergocalciferol, (DRISDOL) 50000 units CAPS capsule Take 1 capsule (50,000 Units total) by mouth every 7 (seven) days. 07/09/16   Tresa Garter, MD    Objective:   Vitals:   04/25/17 1547  BP: 112/74  Pulse: 73  Resp: 16  Temp: 98.6 F (37 C)  TempSrc: Oral  SpO2: 96%  Weight: 229 lb 3.2 oz (104 kg)   Exam General appearance : Awake, alert, not in any distress.  Speech Clear. Not toxic looking, obese HEENT: Atraumatic and Normocephalic, pupils equally reactive to light and accomodation Neck: Supple, no JVD. No cervical lymphadenopathy.  Chest: Good air entry bilaterally, no added sounds  CVS: S1 S2 regular, no murmurs.  Abdomen: Bowel sounds present, Non tender and not distended with no gaurding, rigidity or rebound. Extremities: B/L Lower Ext shows no edema, both legs are warm to touch Neurology: Awake alert, and oriented X 3, CN II-XII intact, Non focal Skin: Barber's Rash  Data Review Lab Results  Component Value Date   HGBA1C 10.1 04/25/2017   HGBA1C 8.5 09/21/2016    HGBA1C 9.0 06/29/2016    Assessment & Plan   1. Type 2 diabetes mellitus without complication, without long-term current use of insulin (HCC)  - POCT glucose (manual entry) - POCT glycosylated hemoglobin (Hb A1C) - Ambulatory referral to Ophthalmology - Ambulatory referral to Podiatry  - metFORMIN (GLUCOPHAGE) 1000 MG tablet; Take 1 tablet (1,000 mg total) by mouth 2 (two) times daily with a meal.  Dispense: 180 tablet; Refill: 3 - glipiZIDE (GLUCOTROL XL) 10 MG 24 hr tablet; Take 1 tablet (10 mg total) by mouth daily with breakfast.  Dispense: 90 tablet; Refill: 3 - gabapentin (NEURONTIN) 300 MG capsule; Take 1 capsule (300 mg total) by mouth 3 (three) times daily.  Dispense: 270 capsule; Refill: 3  2. Dyslipidemia  - atorvastatin (LIPITOR) 40 MG tablet; Take 1 tablet (40 mg total) by mouth daily.  Dispense: 90 tablet; Refill: 3  3. Morbid obesity ([place])  Aim for 30 minutes of exercise most days. Rethink what you drink. Water is great! Aim for 2-3 Carb Choices per meal (30-45 grams) +/- 1 either way  Aim for 0-15 Carbs per snack if hungry  Include protein in moderation with your meals and snacks  Consider reading food labels for Total Carbohydrate and Fat Grams of foods  Consider checking BG at alternate times per day  Continue taking medication as directed Be mindful about how much sugar you are adding to beverages and other foods. Fruit Punch - find one with no sugar  Measure and decrease portions of carbohydrate foods  Make your plate and don't go back for seconds  4. Postoperative hypothyroidism  - levothyroxine (SYNTHROID, LEVOTHROID) 150 MCG tablet; Take 1 tablet (150 mcg total) by mouth daily.  Dispense: 90 tablet; Refill: 3  5. Barbers' rash  - Ambulatory referral to Dermatology  - triamcinolone ointment (KENALOG) 0.5 %; Apply 1 application topically 2 (two) times daily.  Dispense: 60 g; Refill: 1  6. Chronic right shoulder pain  - traMADol (ULTRAM) 50 MG  tablet; Take 1 tablet (50 mg total) by mouth every 12 (twelve) hours as needed.  Dispense: 60 tablet; Refill: 1  Patient have been counseled extensively about nutrition and exercise. Other issues discussed during this visit include: low cholesterol diet, weight control and daily exercise, foot care, annual eye examinations at Ophthalmology, importance of adherence with medications and regular follow-up. We also discussed long term complications of uncontrolled diabetes and hypertension.   Return in about 3 months (around 07/26/2017) for Hemoglobin A1C and Follow up, DM, Follow up HTN, Follow up Pain and comorbidities.  The patient was given clear instructions to go to ER or return to medical center if symptoms don't improve, worsen or new problems develop. The patient verbalized understanding. The patient was told to call to get lab results if they haven't heard anything in the next week.   This note has been created with  Dragon Geophysicist/field seismologist. Any transcriptional errors are unintentional.    Angelica Chessman, MD, [place], [place], [place], [place] and [place], [place]   04/25/2017, 4:20 PM

## 2017-04-25 NOTE — Progress Notes (Signed)
Pt states he is not in any pain right Pt states he is losing hair Pt states he has been having some right arm pain Pt states the pain has been going on for a week

## 2017-04-25 NOTE — Patient Instructions (Signed)
Diabetes Mellitus and Exercise Exercising regularly is important for your overall health, especially when you have diabetes (diabetes mellitus). Exercising is not only about losing weight. It has many health benefits, such as increasing muscle strength and bone density and reducing body fat and stress. This leads to improved fitness, flexibility, and endurance, all of which result in better overall health. Exercise has additional benefits for people with diabetes, including:  Reducing appetite.  Helping to lower and control blood glucose.  Lowering blood pressure.  Helping to control amounts of fatty substances (lipids) in the blood, such as cholesterol and triglycerides.  Helping the body to respond better to insulin (improving insulin sensitivity).  Reducing how much insulin the body needs.  Decreasing the risk for heart disease by: ? Lowering cholesterol and triglyceride levels. ? Increasing the levels of good cholesterol. ? Lowering blood glucose levels.  What is my activity plan? Your health care provider or certified diabetes educator can help you make a plan for the type and frequency of exercise (activity plan) that works for you. Make sure that you:  Do at least 150 minutes of moderate-intensity or vigorous-intensity exercise each week. This could be brisk walking, biking, or water aerobics. ? Do stretching and strength exercises, such as yoga or weightlifting, at least 2 times a week. ? Spread out your activity over at least 3 days of the week.  Get some form of physical activity every day. ? Do not go more than 2 days in a row without some kind of physical activity. ? Avoid being inactive for more than 90 minutes at a time. Take frequent breaks to walk or stretch.  Choose a type of exercise or activity that you enjoy, and set realistic goals.  Start slowly, and gradually increase the intensity of your exercise over time.  What do I need to know about managing my  diabetes?  Check your blood glucose before and after exercising. ? If your blood glucose is higher than 240 mg/dL (13.3 mmol/L) before you exercise, check your urine for ketones. If you have ketones in your urine, do not exercise until your blood glucose returns to normal.  Know the symptoms of low blood glucose (hypoglycemia) and how to treat it. Your risk for hypoglycemia increases during and after exercise. Common symptoms of hypoglycemia can include: ? Hunger. ? Anxiety. ? Sweating and feeling clammy. ? Confusion. ? Dizziness or feeling light-headed. ? Increased heart rate or palpitations. ? Blurry vision. ? Tingling or numbness around the mouth, lips, or tongue. ? Tremors or shakes. ? Irritability.  Keep a rapid-acting carbohydrate snack available before, during, and after exercise to help prevent or treat hypoglycemia.  Avoid injecting insulin into areas of the body that are going to be exercised. For example, avoid injecting insulin into: ? The arms, when playing tennis. ? The legs, when jogging.  Keep records of your exercise habits. Doing this can help you and your health care provider adjust your diabetes management plan as needed. Write down: ? Food that you eat before and after you exercise. ? Blood glucose levels before and after you exercise. ? The type and amount of exercise you have done. ? When your insulin is expected to peak, if you use insulin. Avoid exercising at times when your insulin is peaking.  When you start a new exercise or activity, work with your health care provider to make sure the activity is safe for you, and to adjust your insulin, medicines, or food intake as needed.    Drink plenty of water while you exercise to prevent dehydration or heat stroke. Drink enough fluid to keep your urine clear or pale yellow. This information is not intended to replace advice given to you by your health care provider. Make sure you discuss any questions you have with  your health care provider. Document Released: 12/02/2003 Document Revised: 03/31/2016 Document Reviewed: 02/21/2016 Elsevier Interactive Patient Education  2018 Elsevier Inc. Blood Glucose Monitoring, Adult Monitoring your blood sugar (glucose) helps you manage your diabetes. It also helps you and your health care provider determine how well your diabetes management plan is working. Blood glucose monitoring involves checking your blood glucose as often as directed, and keeping a record (log) of your results over time. Why should I monitor my blood glucose? Checking your blood glucose regularly can:  Help you understand how food, exercise, illnesses, and medicines affect your blood glucose.  Let you know what your blood glucose is at any time. You can quickly tell if you are having low blood glucose (hypoglycemia) or high blood glucose (hyperglycemia).  Help you and your health care provider adjust your medicines as needed.  When should I check my blood glucose? Follow instructions from your health care provider about how often to check your blood glucose. This may depend on:  The type of diabetes you have.  How well-controlled your diabetes is.  Medicines you are taking.  If you have type 1 diabetes:  Check your blood glucose at least 2 times a day.  Also check your blood glucose: ? Before every insulin injection. ? Before and after exercise. ? Between meals. ? 2 hours after a meal. ? Occasionally between 2:00 a.m. and 3:00 a.m., as directed. ? Before potentially dangerous tasks, like driving or using heavy machinery. ? At bedtime.  You may need to check your blood glucose more often, up to 6-10 times a day: ? If you use an insulin pump. ? If you need multiple daily injections (MDI). ? If your diabetes is not well-controlled. ? If you are ill. ? If you have a history of severe hypoglycemia. ? If you have a history of not knowing when your blood glucose is getting low  (hypoglycemia unawareness). If you have type 2 diabetes:  If you take insulin or other diabetes medicines, check your blood glucose at least 2 times a day.  If you are on intensive insulin therapy, check your blood glucose at least 4 times a day. Occasionally, you may also need to check between 2:00 a.m. and 3:00 a.m., as directed.  Also check your blood glucose: ? Before and after exercise. ? Before potentially dangerous tasks, like driving or using heavy machinery.  You may need to check your blood glucose more often if: ? Your medicine is being adjusted. ? Your diabetes is not well-controlled. ? You are ill. What is a blood glucose log?  A blood glucose log is a record of your blood glucose readings. It helps you and your health care provider: ? Look for patterns in your blood glucose over time. ? Adjust your diabetes management plan as needed.  Every time you check your blood glucose, write down your result and notes about things that may be affecting your blood glucose, such as your diet and exercise for the day.  Most glucose meters store a record of glucose readings in the meter. Some meters allow you to download your records to a computer. How do I check my blood glucose? Follow these steps to get accurate   readings of your blood glucose: Supplies needed   Blood glucose meter.  Test strips for your meter. Each meter has its own strips. You must use the strips that come with your meter.  A needle to prick your finger (lancet). Do not use lancets more than once.  A device that holds the lancet (lancing device).  A journal or log book to write down your results. Procedure  Wash your hands with soap and water.  Prick the side of your finger (not the tip) with the lancet. Use a different finger each time.  Gently rub the finger until a small drop of blood appears.  Follow instructions that come with your meter for inserting the test strip, applying blood to the strip,  and using your blood glucose meter.  Write down your result and any notes. Alternative testing sites  Some meters allow you to use areas of your body other than your finger (alternative sites) to test your blood.  If you think you may have hypoglycemia, or if you have hypoglycemia unawareness, do not use alternative sites. Use your finger instead.  Alternative sites may not be as accurate as the fingers, because blood flow is slower in these areas. This means that the result you get may be delayed, and it may be different from the result that you would get from your finger.  The most common alternative sites are: ? Forearm. ? Thigh. ? Palm of the hand. Additional tips  Always keep your supplies with you.  If you have questions or need help, all blood glucose meters have a 24-hour "hotline" number that you can call. You may also contact your health care provider.  After you use a few boxes of test strips, adjust (calibrate) your blood glucose meter by following instructions that came with your meter. This information is not intended to replace advice given to you by your health care provider. Make sure you discuss any questions you have with your health care provider. Document Released: 09/14/2003 Document Revised: 03/31/2016 Document Reviewed: 02/21/2016 Elsevier Interactive Patient Education  2017 Elsevier Inc.  

## 2017-04-26 LAB — CMP14+EGFR
A/G RATIO: 1.5 (ref 1.2–2.2)
ALBUMIN: 4.4 g/dL (ref 3.5–5.5)
ALT: 15 IU/L (ref 0–44)
AST: 10 IU/L (ref 0–40)
Alkaline Phosphatase: 74 IU/L (ref 39–117)
BUN / CREAT RATIO: 14 (ref 9–20)
BUN: 14 mg/dL (ref 6–24)
Bilirubin Total: 0.2 mg/dL (ref 0.0–1.2)
CALCIUM: 9.4 mg/dL (ref 8.7–10.2)
CO2: 26 mmol/L (ref 20–29)
Chloride: 96 mmol/L (ref 96–106)
Creatinine, Ser: 1 mg/dL (ref 0.76–1.27)
GFR, EST AFRICAN AMERICAN: 95 mL/min/{1.73_m2} (ref >59)
GFR, EST NON AFRICAN AMERICAN: 83 mL/min/{1.73_m2} (ref >59)
GLOBULIN, TOTAL: 3 g/dL (ref 1.5–4.5)
Glucose: 219 mg/dL — ABNORMAL HIGH (ref 65–99)
POTASSIUM: 4.3 mmol/L (ref 3.5–5.2)
SODIUM: 137 mmol/L (ref 134–144)
TOTAL PROTEIN: 7.4 g/dL (ref 6.0–8.5)

## 2017-04-26 LAB — LIPID PANEL
CHOL/HDL RATIO: 11.6 ratio — AB (ref 0.0–5.0)
CHOLESTEROL TOTAL: 326 mg/dL — AB (ref 100–199)
HDL: 28 mg/dL — ABNORMAL LOW (ref >39)
Triglycerides: 1261 mg/dL (ref 0–149)

## 2017-04-26 LAB — T4, FREE: Free T4: 0.32 ng/dL — ABNORMAL LOW (ref 0.82–1.77)

## 2017-04-26 LAB — TSH: TSH: 48.35 u[IU]/mL — ABNORMAL HIGH (ref 0.450–4.500)

## 2017-04-29 ENCOUNTER — Other Ambulatory Visit: Payer: Self-pay | Admitting: Internal Medicine

## 2017-04-29 DIAGNOSIS — E781 Pure hyperglyceridemia: Secondary | ICD-10-CM

## 2017-04-29 MED ORDER — FENOFIBRATE 145 MG PO TABS: 145.0000 mg | ORAL_TABLET | Freq: Every day | ORAL | 3 refills | Status: DC

## 2017-05-11 ENCOUNTER — Telehealth: Payer: Self-pay | Admitting: *Deleted

## 2017-05-11 NOTE — Telephone Encounter (Signed)
-----   Message from Tresa Garter, MD sent at 04/29/2017 10:12 AM EDT ----- Please inform patient that his thyroid function is very low, take one and half tablets of your current medication daily (150 mcg + 75 mcg = total of 225 mcg daily) for one month then back to 150 mcg daily until your next visit in 3 months. We will recheck your thyroid level then. Triglyceride level is very high, we need to add another cholesterol lowering medication to the one you take now, (fenofibrate prescribed to pharmacy). Please take your blood sugar lowering medications regularly as prescribed and follow up regularly.

## 2017-05-11 NOTE — Telephone Encounter (Signed)
Medical Assistant used [employer] to contact patient.  Interpreter Name: Blanchard Kelch Interpreter #: [id number] Patient verified DOB Patient is aware of thyroid function being low and needing to begin taking 1 and 1/2 tablets until 06/11/17/ on 06/12/17 patient will go back to taking 1 tablet and a recheck will be completed at the next visit. Patient is also aware of fenofibrate being ordered for high triglycerides. Patient is also aware of needing to monitor blood sugar. No further questions.

## 2017-05-14 MED FILL — TRIAMCINOLONE 0.5% OINTMENT: 0.5 | 30 days supply | Qty: 60 | Fill #0

## 2017-05-24 ENCOUNTER — Encounter: Payer: Self-pay | Admitting: Podiatry

## 2017-05-24 ENCOUNTER — Ambulatory Visit (INDEPENDENT_AMBULATORY_CARE_PROVIDER_SITE_OTHER): Payer: Medicaid Other | Admitting: Podiatry

## 2017-05-24 DIAGNOSIS — E1142 Type 2 diabetes mellitus with diabetic polyneuropathy: Secondary | ICD-10-CM

## 2017-05-24 DIAGNOSIS — M792 Neuralgia and neuritis, unspecified: Secondary | ICD-10-CM

## 2017-05-24 DIAGNOSIS — L853 Xerosis cutis: Secondary | ICD-10-CM

## 2017-05-24 NOTE — Progress Notes (Signed)
   Subjective:    Patient ID: Joe Wallace, male    DOB: 03-20-1961, 56 y.o.   MRN: [id number]  HPI  Chief Complaint  Patient presents with  . Diabetes    Debride  . Numbness    B/L numbness in toes. Pt stated "taking gabapentin but it doesn't help"         Review of Systems  Musculoskeletal: Positive for arthralgias, back pain and myalgias.  All other systems reviewed and are negative.      Objective:   Physical Exam        Assessment & Plan:

## 2017-05-24 NOTE — Patient Instructions (Signed)
Recommended Lotions:  Aveeno Lotion Technical brewer

## 2017-05-24 NOTE — Progress Notes (Signed)
Subjective:  Patient ID: Joe Wallace, male    DOB: [birth year],  MRN: [id number] HPI Chief Complaint  Patient presents with  . Diabetes    Debride  . Numbness    B/L numbness in toes. Pt stated "taking gabapentin but it doesn't help"     56 y.o. male presents with the above complaint. Presents with Fish farm manager. Reports severe numbness in his feet - takes Gabapentin 300 mg three times a day but states since starting it he has only noticed slight improvement. Denies other pedal issues.  Last A1C 10.1 04/25/17  Past Medical History:  Diagnosis Date  . Arthritis   . Diabetes mellitus without complication ([place])   . Hyperlipidemia   . Thyroid neoplasm   . Visual disorder    LEFT EYE   Past Surgical History:  Procedure Laterality Date  . SHOULDER ARTHROSCOPY    . THYROIDECTOMY N/A 08/13/2015   Procedure: TOTAL THYROIDECTOMY;  Surgeon: Armandina Gemma, MD;  Location: [facility];  Service: General;  Laterality: N/A;    Current Outpatient Prescriptions:  .  ACCU-CHEK AVIVA PLUS test strip, USE AS INSTRUCTED, Disp: 100 each, Rfl: 12 .  ACCU-CHEK SOFTCLIX LANCETS lancets, , Disp: , Rfl: 0 .  ACCU-CHEK SOFTCLIX LANCETS lancets, USE AS INSTRUCTED, Disp: 100 each, Rfl: 12 .  albuterol (PROVENTIL HFA;VENTOLIN HFA) 108 (90 Base) MCG/ACT inhaler, Inhale 2 puffs into the lungs every 6 (six) hours as needed for wheezing or shortness of breath., Disp: 1 Inhaler, Rfl: 0 .  aspirin 81 MG tablet, Take 81 mg by mouth daily., Disp: , Rfl:  .  atorvastatin (LIPITOR) 40 MG tablet, Take 1 tablet (40 mg total) by mouth daily., Disp: 90 tablet, Rfl: 3 .  fenofibrate (TRICOR) 145 MG tablet, Take 1 tablet (145 mg total) by mouth daily., Disp: 90 tablet, Rfl: 3 .  gabapentin (NEURONTIN) 300 MG capsule, Take 1 capsule (300 mg total) by mouth 3 (three) times daily., Disp: 270 capsule, Rfl: 3 .  glipiZIDE (GLUCOTROL XL) 10 MG 24 hr tablet, Take 1 tablet (10 mg total) by mouth daily with breakfast., Disp: 90  tablet, Rfl: 3 .  levothyroxine (SYNTHROID, LEVOTHROID) 150 MCG tablet, Take 1 tablet (150 mcg total) by mouth daily., Disp: 90 tablet, Rfl: 3 .  metFORMIN (GLUCOPHAGE) 1000 MG tablet, Take 1 tablet (1,000 mg total) by mouth 2 (two) times daily with a meal., Disp: 180 tablet, Rfl: 3 .  traMADol (ULTRAM) 50 MG tablet, Take 1 tablet (50 mg total) by mouth every 12 (twelve) hours as needed., Disp: 60 tablet, Rfl: 1 .  triamcinolone ointment (KENALOG) 0.5 %, Apply 1 application topically 2 (two) times daily., Disp: 60 g, Rfl: 1 .  Vitamin D, Ergocalciferol, (DRISDOL) 50000 units CAPS capsule, Take 1 capsule (50,000 Units total) by mouth every 7 (seven) days., Disp: 12 capsule, Rfl: 3  No Known Allergies Review of Systems Objective:   Vitals:   General AA&O x3. Normal mood and affect.  Vascular Dorsalis pedis and posterior tibial pulses  present 2+ bilaterally  Capillary refill normal to all digits. Pedal hair growth normal.  Neurologic Epicritic sensation diminished bilaterally. Protective sensation with 5.07 monofilament  3/5 bilaterally. Vibratory sensation present L, dimished R.  Dermatologic No open lesions. Interspaces clear of maceration.  Normal skin temperature. Xerotic and cracked heels bilat Hyperkeratotic lesions: none bilaterally. Nails: normal- no clubbing or nail changes, not elongated  Orthopedic: No history of amputation. Normal lower extremity joint ROM without pain or crepitus.   Assessment &  Plan:  Patient was evaluated and treated and all questions answered.  Diabetes with DPN -Discussed etiology of diabetic neuropathy. -Advised discussion with PCP on titrating up dosage of Gabapentin as he is only taking 900 mg a day. -No need for routine foot care today.  Xerotic Heels -Advised OTC lotion. -Discussed importance of moisturizing feet to prevent skin infections.  Return in about 6 months (around 11/22/2017) for diabetic foot care.

## 2017-08-01 ENCOUNTER — Ambulatory Visit: Payer: Medicaid Other | Admitting: Internal Medicine

## 2017-08-08 ENCOUNTER — Ambulatory Visit: Payer: Medicaid Other | Attending: Internal Medicine | Admitting: Internal Medicine

## 2017-08-08 ENCOUNTER — Encounter: Payer: Self-pay | Admitting: Internal Medicine

## 2017-08-08 VITALS — BP 98/66 | HR 81 | Temp 98.2°F | Resp 18 | Ht 70.0 in | Wt 228.6 lb

## 2017-08-08 DIAGNOSIS — Z7984 Long term (current) use of oral hypoglycemic drugs: Secondary | ICD-10-CM | POA: Diagnosis not present

## 2017-08-08 DIAGNOSIS — E119 Type 2 diabetes mellitus without complications: Secondary | ICD-10-CM

## 2017-08-08 DIAGNOSIS — Z79899 Other long term (current) drug therapy: Secondary | ICD-10-CM | POA: Insufficient documentation

## 2017-08-08 DIAGNOSIS — Z7982 Long term (current) use of aspirin: Secondary | ICD-10-CM | POA: Insufficient documentation

## 2017-08-08 DIAGNOSIS — E785 Hyperlipidemia, unspecified: Secondary | ICD-10-CM | POA: Diagnosis not present

## 2017-08-08 DIAGNOSIS — E890 Postprocedural hypothyroidism: Secondary | ICD-10-CM | POA: Diagnosis not present

## 2017-08-08 DIAGNOSIS — E781 Pure hyperglyceridemia: Secondary | ICD-10-CM | POA: Diagnosis not present

## 2017-08-08 DIAGNOSIS — Z85850 Personal history of malignant neoplasm of thyroid: Secondary | ICD-10-CM | POA: Diagnosis not present

## 2017-08-08 DIAGNOSIS — I10 Essential (primary) hypertension: Secondary | ICD-10-CM | POA: Diagnosis not present

## 2017-08-08 DIAGNOSIS — G8929 Other chronic pain: Secondary | ICD-10-CM

## 2017-08-08 DIAGNOSIS — M25511 Pain in right shoulder: Secondary | ICD-10-CM | POA: Insufficient documentation

## 2017-08-08 LAB — GLUCOSE, POCT (MANUAL RESULT ENTRY): POC GLUCOSE: 305 mg/dL — AB (ref 70–99)

## 2017-08-08 LAB — POCT GLYCOSYLATED HEMOGLOBIN (HGB A1C): Hemoglobin A1C: 9.6

## 2017-08-08 MED ORDER — LEVOTHYROXINE SODIUM 150 MCG PO TABS: 150.0000 ug | ORAL_TABLET | Freq: Every day | ORAL | 3 refills | Status: DC

## 2017-08-08 MED ORDER — GABAPENTIN 300 MG PO CAPS: 300.0000 mg | ORAL_CAPSULE | Freq: Three times a day (TID) | ORAL | 3 refills | Status: DC

## 2017-08-08 MED ORDER — FENOFIBRATE 145 MG PO TABS: 145.0000 mg | ORAL_TABLET | Freq: Every day | ORAL | 3 refills | Status: DC

## 2017-08-08 MED ORDER — GLIPIZIDE ER 10 MG PO TB24: 10.0000 mg | ORAL_TABLET | Freq: Every day | ORAL | 3 refills | Status: DC

## 2017-08-08 MED ORDER — ATORVASTATIN CALCIUM 40 MG PO TABS: 40.0000 mg | ORAL_TABLET | Freq: Every day | ORAL | 3 refills | Status: DC

## 2017-08-08 MED ORDER — METFORMIN HCL 1000 MG PO TABS: 1000.0000 mg | ORAL_TABLET | Freq: Two times a day (BID) | ORAL | 3 refills | Status: DC

## 2017-08-08 MED ORDER — ACETAMINOPHEN-CODEINE #3 300-30 MG PO TABS: 1.0000 | ORAL_TABLET | ORAL | 1 refills | Status: DC | PRN

## 2017-08-08 NOTE — Progress Notes (Signed)
Weaver Tweed, is a 56 y.o. male  LPF:[id number]  ZHG:[id number]  DOB - 1961-02-21  Subjective:   Joe Wallace is a 56 y.o. male with history of diabetes mellitus, hypertension, status post total thyroidectomy for papillary thyroid cancer who presents here today for a follow up visit and medication refill. He has no new complaint today, still complaining of his shoulder pain, s/p surgery. He does not want any more surgery, doing physical therapy by self at home. He needs refill of his medications today. Patient has No headache, No chest pain, No abdominal pain - No Nausea, No new weakness tingling or numbness, No Cough - SOB.  No problems updated.  ALLERGIES: No Known Allergies  PAST MEDICAL HISTORY: Past Medical History:  Diagnosis Date  . Arthritis   . Diabetes mellitus without complication ([place])   . Hyperlipidemia   . Thyroid neoplasm   . Visual disorder    LEFT EYE    MEDICATIONS AT HOME: Prior to Admission medications   Medication Sig Start Date End Date Taking? Authorizing Provider  ACCU-CHEK AVIVA PLUS test strip USE AS INSTRUCTED 07/07/16   Tresa Garter, MD  ACCU-CHEK SOFTCLIX LANCETS lancets  01/01/15   [provider]  ACCU-CHEK SOFTCLIX LANCETS lancets USE AS INSTRUCTED 07/07/16   Tresa Garter, MD  acetaminophen-codeine (TYLENOL #3) 300-30 MG tablet Take 1 tablet every 4 (four) hours as needed by mouth. 08/08/17   Tresa Garter, MD  albuterol (PROVENTIL HFA;VENTOLIN HFA) 108 (90 Base) MCG/ACT inhaler Inhale 2 puffs into the lungs every 6 (six) hours as needed for wheezing or shortness of breath. 03/03/16   Arnoldo Morale, MD  aspirin 81 MG tablet Take 81 mg by mouth daily.    [provider]  atorvastatin (LIPITOR) 40 MG tablet Take 1 tablet (40 mg total) daily by mouth. 08/08/17   Tresa Garter, MD  fenofibrate (TRICOR) 145 MG tablet Take 1 tablet (145 mg total) daily by mouth. 08/08/17   Tresa Garter, MD    gabapentin (NEURONTIN) 300 MG capsule Take 1 capsule (300 mg total) 3 (three) times daily by mouth. 08/08/17   June Vacha E, MD  glipiZIDE (GLUCOTROL XL) 10 MG 24 hr tablet Take 1 tablet (10 mg total) daily with breakfast by mouth. 08/08/17   Tresa Garter, MD  levothyroxine (SYNTHROID, LEVOTHROID) 150 MCG tablet Take 1 tablet (150 mcg total) daily by mouth. 08/08/17   Tresa Garter, MD  metFORMIN (GLUCOPHAGE) 1000 MG tablet Take 1 tablet (1,000 mg total) 2 (two) times daily with a meal by mouth. 08/08/17   Tresa Garter, MD  triamcinolone ointment (KENALOG) 0.5 % Apply 1 application topically 2 (two) times daily. 04/25/17   Tresa Garter, MD  Vitamin D, Ergocalciferol, (DRISDOL) 50000 units CAPS capsule Take 1 capsule (50,000 Units total) by mouth every 7 (seven) days. 07/09/16   Tresa Garter, MD    Objective:   Vitals:   08/08/17 1608  BP: 98/66  Pulse: 81  Resp: 18  Temp: 98.2 F (36.8 C)  TempSrc: Oral  SpO2: 97%  Weight: 228 lb 9.6 oz (103.7 kg)  Height: 5\' 10"  (1.778 m)   Exam General appearance : Awake, alert, not in any distress. Speech Clear. Not toxic looking, obese HEENT: Atraumatic and Normocephalic, pupils equally reactive to light and accomodation Neck: Supple, no JVD. No cervical lymphadenopathy.  Chest: Good air entry bilaterally, no added sounds  CVS: S1 S2 regular, no murmurs.  Abdomen: Bowel sounds  present, Non tender and not distended with no gaurding, rigidity or rebound. Extremities: B/L Lower Ext shows no edema, both legs are warm to touch Neurology: Awake alert, and oriented X 3, CN II-XII intact, Non focal Skin: No Rash  Data Review Lab Results  Component Value Date   HGBA1C 9.6 08/08/2017   HGBA1C 10.1 04/25/2017   HGBA1C 8.5 09/21/2016    Assessment & Plan   1. Type 2 diabetes mellitus without complication, without long-term current use of insulin (HCC)  - Glucose (CBG) - HgB A1c is 9.6% today,  improved from 10.1% previously but still above goal of <8%.   - gabapentin (NEURONTIN) 300 MG capsule; Take 1 capsule (300 mg total) 3 (three) times daily by mouth.  Dispense: 270 capsule; Refill: 3 - glipiZIDE (GLUCOTROL XL) 10 MG 24 hr tablet; Take 1 tablet (10 mg total) daily with breakfast by mouth.  Dispense: 90 tablet; Refill: 3 - metFORMIN (GLUCOPHAGE) 1000 MG tablet; Take 1 tablet (1,000 mg total) 2 (two) times daily with a meal by mouth.  Dispense: 180 tablet; Refill: 3  2. Dyslipidemia  - atorvastatin (LIPITOR) 40 MG tablet; Take 1 tablet (40 mg total) daily by mouth.  Dispense: 90 tablet; Refill: 3  3. Chronic right shoulder pain  - acetaminophen-codeine (TYLENOL #3) 300-30 MG tablet; Take 1 tablet every 4 (four) hours as needed by mouth.  Dispense: 60 tablet; Refill: 1  4. Postoperative hypothyroidism  - levothyroxine (SYNTHROID, LEVOTHROID) 150 MCG tablet; Take 1 tablet (150 mcg total) daily by mouth.  Dispense: 90 tablet; Refill: 3  5. Morbid obesity ([place]) Aim for 30 minutes of exercise most days. Rethink what you drink. Water is great! Aim for 2-3 Carb Choices per meal (30-45 grams) +/- 1 either way  Aim for 0-15 Carbs per snack if hungry  Include protein in moderation with your meals and snacks  Consider reading food labels for Total Carbohydrate and Fat Grams of foods  Consider checking BG at alternate times per day  Continue taking medication as directed Be mindful about how much sugar you are adding to beverages and other foods. Fruit Punch - find one with no sugar  Measure and decrease portions of carbohydrate foods  Make your plate and don't go back for seconds  6. Hypertriglyceridemia  - fenofibrate (TRICOR) 145 MG tablet; Take 1 tablet (145 mg total) daily by mouth.  Dispense: 90 tablet; Refill: 3  Patient have been counseled extensively about nutrition and exercise. Other issues discussed during this visit include: low cholesterol diet, weight control and  daily exercise, foot care, annual eye examinations at Ophthalmology, importance of adherence with medications and regular follow-up. We also discussed long term complications of uncontrolled diabetes and hypertension.   Return in about 3 months (around 11/08/2017) for Hemoglobin A1C and Follow up, DM, Follow up HTN, Dyslipidemia.  Interpreter was used to communicate directly with patient for the entire encounter including providing detailed patient instructions.   The patient was given clear instructions to go to ER or return to medical center if symptoms don't improve, worsen or new problems develop. The patient verbalized understanding. The patient was told to call to get lab results if they haven't heard anything in the next week.   This note has been created with Surveyor, quantity. Any transcriptional errors are unintentional.    Angelica Chessman, MD, [place], Karilyn C[place], [place] [facility] [place], [place]   08/08/2017, 4:47 PM

## 2017-08-08 NOTE — Patient Instructions (Signed)
Diabetes Mellitus and Food It is important for you to manage your blood sugar (glucose) level. Your blood glucose level can be greatly affected by what you eat. Eating healthier foods in the appropriate amounts throughout the day at about the same time each day will help you control your blood glucose level. It can also help slow or prevent worsening of your diabetes mellitus. Healthy eating may even help you improve the level of your blood pressure and reach or maintain a healthy weight. General recommendations for healthful eating and cooking habits include:  Eating meals and snacks regularly. Avoid going long periods of time without eating to lose weight.  Eating a diet that consists mainly of plant-based foods, such as fruits, vegetables, nuts, legumes, and whole grains.  Using low-heat cooking methods, such as baking, instead of high-heat cooking methods, such as deep frying.  Work with your dietitian to make sure you understand how to use the Nutrition Facts information on food labels. How can food affect me? Carbohydrates Carbohydrates affect your blood glucose level more than any other type of food. Your dietitian will help you determine how many carbohydrates to eat at each meal and teach you how to count carbohydrates. Counting carbohydrates is important to keep your blood glucose at a healthy level, especially if you are using insulin or taking certain medicines for diabetes mellitus. Alcohol Alcohol can cause sudden decreases in blood glucose (hypoglycemia), especially if you use insulin or take certain medicines for diabetes mellitus. Hypoglycemia can be a life-threatening condition. Symptoms of hypoglycemia (sleepiness, dizziness, and disorientation) are similar to symptoms of having too much alcohol. If your health care provider has given you approval to drink alcohol, do so in moderation and use the following guidelines:  Women should not have more than one drink per day, and men  should not have more than two drinks per day. One drink is equal to: ? 12 oz of beer. ? 5 oz of wine. ? 1 oz of hard liquor.  Do not drink on an empty stomach.  Keep yourself hydrated. Have water, diet soda, or unsweetened iced tea.  Regular soda, juice, and other mixers might contain a lot of carbohydrates and should be counted.  What foods are not recommended? As you make food choices, it is important to remember that all foods are not the same. Some foods have fewer nutrients per serving than other foods, even though they might have the same number of calories or carbohydrates. It is difficult to get your body what it needs when you eat foods with fewer nutrients. Examples of foods that you should avoid that are high in calories and carbohydrates but low in nutrients include:  Trans fats (most processed foods list trans fats on the Nutrition Facts label).  Regular soda.  Juice.  Candy.  Sweets, such as cake, pie, doughnuts, and cookies.  Fried foods.  What foods can I eat? Eat nutrient-rich foods, which will nourish your body and keep you healthy. The food you should eat also will depend on several factors, including:  The calories you need.  The medicines you take.  Your weight.  Your blood glucose level.  Your blood pressure level.  Your cholesterol level.  You should eat a variety of foods, including:  Protein. ? Lean cuts of meat. ? Proteins low in saturated fats, such as fish, egg whites, and beans. Avoid processed meats.  Fruits and vegetables. ? Fruits and vegetables that may help control blood glucose levels, such as apples,   mangoes, and yams.  Dairy products. ? Choose fat-free or low-fat dairy products, such as milk, yogurt, and cheese.  Grains, bread, pasta, and rice. ? Choose whole grain products, such as multigrain bread, whole oats, and brown rice. These foods may help control blood pressure.  Fats. ? Foods containing healthful fats, such as  nuts, avocado, olive oil, canola oil, and fish.  Does everyone with diabetes mellitus have the same meal plan? Because every person with diabetes mellitus is different, there is not one meal plan that works for everyone. It is very important that you meet with a dietitian who will help you create a meal plan that is just right for you. This information is not intended to replace advice given to you by your health care provider. Make sure you discuss any questions you have with your health care provider. Document Released: 06/08/2005 Document Revised: 02/17/2016 Document Reviewed: 08/08/2013 Elsevier Interactive Patient Education  2017 [employer] Diabetes Mellitus and Exercise Exercising regularly is important for your overall health, especially when you have diabetes (diabetes mellitus). Exercising is not only about losing weight. It has many health benefits, such as increasing muscle strength and bone density and reducing body fat and stress. This leads to improved fitness, flexibility, and endurance, all of which result in better overall health. Exercise has additional benefits for people with diabetes, including:  Reducing appetite.  Helping to lower and control blood glucose.  Lowering blood pressure.  Helping to control amounts of fatty substances (lipids) in the blood, such as cholesterol and triglycerides.  Helping the body to respond better to insulin (improving insulin sensitivity).  Reducing how much insulin the body needs.  Decreasing the risk for heart disease by: ? Lowering cholesterol and triglyceride levels. ? Increasing the levels of good cholesterol. ? Lowering blood glucose levels.  What is my activity plan? Your health care provider or certified diabetes educator can help you make a plan for the type and frequency of exercise (activity plan) that works for you. Make sure that you:  Do at least 150 minutes of moderate-intensity or vigorous-intensity exercise each  week. This could be brisk walking, biking, or water aerobics. ? Do stretching and strength exercises, such as yoga or weightlifting, at least 2 times a week. ? Spread out your activity over at least 3 days of the week.  Get some form of physical activity every day. ? Do not go more than 2 days in a row without some kind of physical activity. ? Avoid being inactive for more than 90 minutes at a time. Take frequent breaks to walk or stretch.  Choose a type of exercise or activity that you enjoy, and set realistic goals.  Start slowly, and gradually increase the intensity of your exercise over time.  What do I need to know about managing my diabetes?  Check your blood glucose before and after exercising. ? If your blood glucose is higher than 240 mg/dL (13.3 mmol/L) before you exercise, check your urine for ketones. If you have ketones in your urine, do not exercise until your blood glucose returns to normal.  Know the symptoms of low blood glucose (hypoglycemia) and how to treat it. Your risk for hypoglycemia increases during and after exercise. Common symptoms of hypoglycemia can include: ? Hunger. ? Anxiety. ? Sweating and feeling clammy. ? Confusion. ? Dizziness or feeling light-headed. ? Increased heart rate or palpitations. ? Blurry vision. ? Tingling or numbness around the mouth, lips, or tongue. ? Tremors or shakes. ?   Irritability.  Keep a rapid-acting carbohydrate snack available before, during, and after exercise to help prevent or treat hypoglycemia.  Avoid injecting insulin into areas of the body that are going to be exercised. For example, avoid injecting insulin into: ? The arms, when playing tennis. ? The legs, when jogging.  Keep records of your exercise habits. Doing this can help you and your health care provider adjust your diabetes management plan as needed. Write down: ? Food that you eat before and after you exercise. ? Blood glucose levels before and after you  exercise. ? The type and amount of exercise you have done. ? When your insulin is expected to peak, if you use insulin. Avoid exercising at times when your insulin is peaking.  When you start a new exercise or activity, work with your health care provider to make sure the activity is safe for you, and to adjust your insulin, medicines, or food intake as needed.  Drink plenty of water while you exercise to prevent dehydration or heat stroke. Drink enough fluid to keep your urine clear or pale yellow. This information is not intended to replace advice given to you by your health care provider. Make sure you discuss any questions you have with your health care provider. Document Released: 12/02/2003 Document Revised: 03/31/2016 Document Reviewed: 02/21/2016 Elsevier Interactive Patient Education  2018 Else[employer] Inc.  

## 2017-08-09 LAB — CMP14+EGFR
ALBUMIN: 4.6 g/dL (ref 3.5–5.5)
ALT: 18 IU/L (ref 0–44)
AST: 13 IU/L (ref 0–40)
Albumin/Globulin Ratio: 1.5 (ref 1.2–2.2)
Alkaline Phosphatase: 85 IU/L (ref 39–117)
BUN / CREAT RATIO: 16 (ref 9–20)
BUN: 15 mg/dL (ref 6–24)
Bilirubin Total: <0.2 mg/dL (ref 0.0–1.2)
CO2: 21 mmol/L (ref 20–29)
CREATININE: 0.96 mg/dL (ref 0.76–1.27)
Calcium: 9.7 mg/dL (ref 8.7–10.2)
Chloride: 99 mmol/L (ref 96–106)
GFR calc non Af Amer: 87 mL/min/{1.73_m2} (ref >59)
GFR, EST AFRICAN AMERICAN: 100 mL/min/{1.73_m2} (ref >59)
GLUCOSE: 315 mg/dL — AB (ref 65–99)
Globulin, Total: 3 g/dL (ref 1.5–4.5)
Potassium: 4.3 mmol/L (ref 3.5–5.2)
Sodium: 137 mmol/L (ref 134–144)
TOTAL PROTEIN: 7.6 g/dL (ref 6.0–8.5)

## 2017-08-09 LAB — LIPID PANEL
CHOL/HDL RATIO: 7.4 ratio — AB (ref 0.0–5.0)
CHOLESTEROL TOTAL: 258 mg/dL — AB (ref 100–199)
HDL: 35 mg/dL — ABNORMAL LOW (ref >39)
Triglycerides: 610 mg/dL (ref 0–149)

## 2017-08-09 LAB — T4, FREE: FREE T4: 1.58 ng/dL (ref 0.82–1.77)

## 2017-08-09 LAB — TSH: TSH: 0.512 u[IU]/mL (ref 0.450–4.500)

## 2017-08-10 LAB — URINALYSIS, COMPLETE

## 2017-08-30 MED FILL — metFORMIN HCL 1000 MG TABS: 1000 | 30 days supply | Qty: 60 | Fill #0

## 2017-09-24 ENCOUNTER — Telehealth: Payer: Self-pay | Admitting: *Deleted

## 2017-09-24 NOTE — Telephone Encounter (Signed)
Medical Assistant used [employer] to contact patient.  Interpreter Name: Alben Spittle Interpreter #: [phone] Patient verified DOB Patient is aware of cholesterol improving but still showing high. Patient should continue with both cholesterol medications. Patient is also aware of thyroid being back to normal and to adhere to all medications and controlling BS. No further questions at this time.

## 2017-09-24 NOTE — Telephone Encounter (Signed)
-----   Message from Tresa Garter, MD sent at 08/12/2017 10:06 AM EST ----- Lab results showed high but improving cholesterol. Please continue your cholesterol medicines and also please limit saturated fat to no more than 7% of your calories, limit cholesterol to 200 mg/day, increase fiber and exercise as tolerated. If needed we may add another cholesterol lowering medication to your regimen. Thyroid function is back to normal, please continue your medications as prescribed. Continue blood sugar control as discussed during your office visit

## 2017-10-11 ENCOUNTER — Encounter: Payer: Self-pay | Admitting: Family Medicine

## 2017-10-11 ENCOUNTER — Other Ambulatory Visit: Payer: Self-pay | Admitting: Family Medicine

## 2017-10-11 ENCOUNTER — Ambulatory Visit (INDEPENDENT_AMBULATORY_CARE_PROVIDER_SITE_OTHER): Payer: Medicaid Other | Admitting: Family Medicine

## 2017-10-11 ENCOUNTER — Other Ambulatory Visit: Payer: Self-pay

## 2017-10-11 VITALS — BP 126/84 | HR 89 | Temp 98.1°F | Ht 70.0 in | Wt 229.0 lb

## 2017-10-11 DIAGNOSIS — L918 Other hypertrophic disorders of the skin: Secondary | ICD-10-CM

## 2017-10-11 DIAGNOSIS — L649 Androgenic alopecia, unspecified: Secondary | ICD-10-CM | POA: Insufficient documentation

## 2017-10-11 DIAGNOSIS — L639 Alopecia areata, unspecified: Secondary | ICD-10-CM | POA: Diagnosis present

## 2017-10-11 MED ORDER — FLUOCINONIDE 0.05 % EX SOLN: 1.0000 "application " | Freq: Two times a day (BID) | CUTANEOUS | 0 refills | Status: DC

## 2017-10-11 MED ORDER — MINOXIDIL 5 % EX SOLN: CUTANEOUS | 0 refills | Status: DC

## 2017-10-11 NOTE — Patient Instructions (Addendum)
It was a pleasure to see you today! Thank you for choosing [facility] for your primary care. Joe Wallace was seen for hair loss and skin tag removal. Come back to the clinic in 3 months to evaluate hair loss treatment. Return to clinic sooner if you notice an infection where skin tags were removed including pain, drainage, or fever.   Meds ordered this encounter  Medications  . fluocinonide (LIDEX) 0.05 % external solution    Sig: Apply 1 application topically 2 (two) times daily.    Dispense:  60 mL    Refill:  0  . MINOXIDIL, TOPICAL, (MINOXIDIL FOR MEN) 5 % SOLN    Sig: Apply to affected area once daily    Dispense:  1 Bottle    Refill:  0     Best,  Marny Lowenstein, MD, MS FAMILY MEDICINE RESIDENT - PGY1 10/11/2017 2:59 PM   Alopecia Areata, Adult Alopecia areata is a condition that causes you to lose hair. You may lose hair on your scalp in patches. In some cases, you may lose all the hair on your scalp (alopecia totalis) or all the hair from your face and body (alopecia universalis). Alopecia areata is an autoimmune disease. This means that your body's defense system (immune system) mistakes normal parts of the body for germs or other things that can make you sick. When you have alopecia areata, the immune system attacks the hair follicles. Alopecia areata usually develops in childhood, but it can develop at any age. For some people, their hair grows back on its own and hair loss does not happen again. For others, their hair may fall out and grow back in cycles. The hair loss may last many years. Having this condition can be emotionally difficult, but it is not dangerous. What are the causes? The cause of this condition is not known. What increases the risk? This condition is more likely to develop in people who have:  A family history of alopecia.  A family history of another autoimmune disease, including type 1 diabetes and rheumatoid arthritis.  Asthma and  allergies.  Down syndrome.  What are the signs or symptoms? Round spots of patchy hair loss on the scalp is the main symptom of this condition. The spots may be mildly itchy. Other symptoms include:  Short dark hairs in the bald patches that are wider at the top (exclamation point hairs).  Dents, white spots, or lines in the fingernails or toenails.  Balding and body hair loss. This is rare.  How is this diagnosed? This condition is diagnosed based on your symptoms and family history. Your health care provider will also check your scalp skin, teeth, and nails. Your health care provider may refer you to a specialist in hair and skin disorders (dermatologist). You may also have tests, including:  A hair pull test.  Blood tests or other screening tests to check for autoimmune diseases, such as thyroid disease or diabetes.  Skin biopsy to confirm the diagnosis.  A procedure to examine the skin with a lighted magnifying instrument (dermoscopy).  How is this treated? There is no cure for alopecia areata. Treatment is aimed at promoting the regrowth of hair and preventing the immune system from overreacting. No single treatment is right for all people with alopecia areata. It depends on the type of hair loss you have and how severe it is. Work with your health care provider to find the best treatment for you. Treatment may include:  Having  regular checkups to make sure the condition is not getting worse (watchful waiting).  Steroid creams or pills for 6-8 weeks to stop the immune reaction and help hair to regrow more quickly.  Other topical medicines to alter the immune system response and support the hair growth cycle.  Steroid injections.  Therapy and counseling with a support group or therapist if you are having trouble coping with hair loss.  Follow these instructions at home:  Learn as much as you can about your condition.  Apply topical creams only as told by your health care  provider.  Take over-the-counter and prescription medicines only as told by your health care provider.  Consider getting a wig or products to make hair look fuller or to cover bald spots, if you feel uncomfortable with your appearance.  Get therapy or counseling if you are having a hard time coping with hair loss. Ask your health care provider to recommend a counselor or support group.  Keep all follow-up visits as told by your health care provider. This is important. Contact a health care provider if:  Your hair loss gets worse, even with treatment.  You have new symptoms.  You are struggling emotionally. Summary  Alopecia areata is an autoimmune condition that makes your body's defense system (immune system) attack the hair follicles. This causes you to lose hair.  Treatments may include regular checkups to make sure that the condition is not getting worse (watchful waiting), medicines, and steroid injections. This information is not intended to replace advice given to you by your health care provider. Make sure you discuss any questions you have with your health care provider. Document Released: 04/15/2004 Document Revised: 09/29/2016 Document Reviewed: 09/29/2016 Elsevier Interactive Patient Education  2018 [employer].

## 2017-10-11 NOTE — Assessment & Plan Note (Signed)
Exhibits features of alopecia areata along the posterior of scalp.  Recommend trial of fluocinonide 0.05% solution applied to lesions as prescribed.  Have patient return in 3 months to evaluate progress

## 2017-10-11 NOTE — Assessment & Plan Note (Signed)
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  Recommend trial of minoxidil as prescribed.  Have patient return back in 3 months to assess progress

## 2017-10-11 NOTE — Assessment & Plan Note (Addendum)
PRE-OP DIAGNOSIS: Achondron x2, on neck bilaterally  POST-OP DIAGNOSIS: Same  PROCEDURE: skin lesion excision  Performing Physician: Dr. Grandville Silos Supervising Physician (if applicable): Dr. Gwendlyn Deutscher, Dr. Andy Gauss PROCEDURE:   x Scissors excission The area surrounding the skin lesion was prepared and draped in the usual sterile manner. The lesion was removed in the usual manner as noted above. Hemostasis was assured. Covered with a dry, adhesive bandage Closure: None Followup: The patient tolerated the procedure well without complications. Standard post-procedure care is explained and return precautions are given.

## 2017-10-11 NOTE — Progress Notes (Signed)
    Subjective:  Joe Wallace is a 57 y.o. male who presents to the [facility] today with a chief complaint of hair loss and removal of skin tag.   HPI:  Skin Tag Patient has two skin tags that he desires to have removed. One located on each shoulder. They are not painful and are not changing. They have been there for several years.   Hair loss Patient has notice scalp hairloss for 7 months without itching or pain. It is falling out from the front and from behind. He has not had this type of hairloss before. It is not getting worse. Nothing make it better or worse. He has tried any medications to treat it.   ROS: Per HPI   Objective:  Physical Exam: BP 126/84   Pulse 89   Temp 98.1 F (36.7 C) (Oral)   Ht 5\' 10"  (1.778 m)   Wt 229 lb (103.9 kg)   SpO2 94%   BMI 32.86 kg/m   Gen: NAD, resting comfortably CV: RRR with no murmurs appreciated Pulm: NWOB, CTAB with no crackles, wheezes, or rhonchi Skin: Small brown, pedunculated lesion consistent with acrochordon on interface between shoulder and neck bilaterally.  Hairloss consistent with male pattern baldness along frontal and crown Additional hair loss in two distinct posterior locations on occiput. No scaling, erythema, lesions noted.  Neuro: grossly normal, moves all extremities Psych: Normal affect and thought content              No results found for this or any previous visit (from the past 72 hour(s)).   Assessment/Plan:  Skin tag PRE-OP DIAGNOSIS: Achondron x2, on neck bilaterally  POST-OP DIAGNOSIS: Same  PROCEDURE: skin lesion excision  Performing Physician: Dr. Grandville Silos Supervising Physician (if applicable): Dr. Gwendlyn Deutscher, Dr. Andy Gauss PROCEDURE:   x Scissors excission The area surrounding the skin lesion was prepared and draped in the usual sterile manner. The lesion was removed in the usual manner as noted above. Hemostasis was assured. Covered with a dry, adhesive bandage Closure: None Followup: The  patient tolerated the procedure well without complications. Standard post-procedure care is explained and return precautions are given.   Alopecia areata Exhibits features of alopecia areata along the posterior of scalp.  Recommend trial of fluocinonide 0.05% solution applied to lesions as prescribed.  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  Recommend trial of minoxidil as prescribed.  Have patient return back in 3 months to assess progress   Lab Orders  No laboratory test(s) ordered today    Meds ordered this encounter  Medications  . fluocinonide (LIDEX) 0.05 % external solution    Sig: Apply 1 application topically 2 (two) times daily.    Dispense:  60 mL    Refill:  0  . MINOXIDIL, TOPICAL, (MINOXIDIL FOR MEN) 5 % SOLN    Sig: Apply to affected area once daily    Dispense:  1 Bottle    Refill:  0    Marny Lowenstein, MD, MS FAMILY MEDICINE RESIDENT - PGY1 10/11/2017 4:31 PM

## 2017-10-12 ENCOUNTER — Other Ambulatory Visit: Payer: Self-pay | Admitting: *Deleted

## 2017-10-12 DIAGNOSIS — L649 Androgenic alopecia, unspecified: Secondary | ICD-10-CM

## 2017-10-12 DIAGNOSIS — L639 Alopecia areata, unspecified: Secondary | ICD-10-CM

## 2017-10-12 MED ORDER — MINOXIDIL 5 % EX SOLN: CUTANEOUS | 0 refills | Status: DC

## 2017-10-12 MED ORDER — FLUOCINONIDE 0.05 % EX SOLN: 1.0000 "application " | Freq: Two times a day (BID) | CUTANEOUS | 0 refills | Status: DC

## 2017-10-12 NOTE — Telephone Encounter (Signed)
Patient came into office stating that scripts were sent to the wrong [facility].  They thought it was [place] [facility] but they actually use the [facility] on [address] in [place].  Medication resent to correct pharmacy. Grettell Ransdell,CMA

## 2017-11-09 ENCOUNTER — Other Ambulatory Visit: Payer: Self-pay | Admitting: Internal Medicine

## 2017-11-09 DIAGNOSIS — E785 Hyperlipidemia, unspecified: Secondary | ICD-10-CM

## 2017-11-14 ENCOUNTER — Ambulatory Visit: Payer: Medicaid Other | Attending: Internal Medicine | Admitting: Internal Medicine

## 2017-11-14 VITALS — BP 125/83 | HR 78 | Temp 98.7°F | Resp 12 | Wt 229.0 lb

## 2017-11-14 DIAGNOSIS — Z9889 Other specified postprocedural states: Secondary | ICD-10-CM | POA: Diagnosis not present

## 2017-11-14 DIAGNOSIS — Z7984 Long term (current) use of oral hypoglycemic drugs: Secondary | ICD-10-CM | POA: Diagnosis not present

## 2017-11-14 DIAGNOSIS — H9193 Unspecified hearing loss, bilateral: Secondary | ICD-10-CM

## 2017-11-14 DIAGNOSIS — H9190 Unspecified hearing loss, unspecified ear: Secondary | ICD-10-CM | POA: Diagnosis not present

## 2017-11-14 DIAGNOSIS — E785 Hyperlipidemia, unspecified: Secondary | ICD-10-CM

## 2017-11-14 DIAGNOSIS — Z7982 Long term (current) use of aspirin: Secondary | ICD-10-CM | POA: Insufficient documentation

## 2017-11-14 DIAGNOSIS — E781 Pure hyperglyceridemia: Secondary | ICD-10-CM

## 2017-11-14 DIAGNOSIS — E1149 Type 2 diabetes mellitus with other diabetic neurological complication: Secondary | ICD-10-CM

## 2017-11-14 DIAGNOSIS — M1990 Unspecified osteoarthritis, unspecified site: Secondary | ICD-10-CM | POA: Insufficient documentation

## 2017-11-14 DIAGNOSIS — E890 Postprocedural hypothyroidism: Secondary | ICD-10-CM

## 2017-11-14 DIAGNOSIS — Z79899 Other long term (current) drug therapy: Secondary | ICD-10-CM | POA: Insufficient documentation

## 2017-11-14 DIAGNOSIS — E1140 Type 2 diabetes mellitus with diabetic neuropathy, unspecified: Secondary | ICD-10-CM | POA: Diagnosis present

## 2017-11-14 DIAGNOSIS — E119 Type 2 diabetes mellitus without complications: Secondary | ICD-10-CM

## 2017-11-14 LAB — GLUCOSE, POCT (MANUAL RESULT ENTRY): POC Glucose: 321 mg/dl — AB (ref 70–99)

## 2017-11-14 MED ORDER — ATORVASTATIN CALCIUM 40 MG PO TABS: 40.0000 mg | ORAL_TABLET | Freq: Every day | ORAL | 3 refills | Status: DC

## 2017-11-14 MED ORDER — GLIPIZIDE ER 10 MG PO TB24: 10.0000 mg | ORAL_TABLET | Freq: Every day | ORAL | 3 refills | Status: DC

## 2017-11-14 MED ORDER — FENOFIBRATE 145 MG PO TABS: 145.0000 mg | ORAL_TABLET | Freq: Every day | ORAL | 3 refills | Status: DC

## 2017-11-14 MED ORDER — INSULIN ASPART 100 UNIT/ML ~~LOC~~ SOLN
10.0000 [IU] | Freq: Once | SUBCUTANEOUS | Status: AC
  Administered 2017-11-14: 10 [IU] via SUBCUTANEOUS

## 2017-11-14 MED ORDER — ACCU-CHEK SOFTCLIX LANCETS MISC: 12 refills | Status: DC

## 2017-11-14 MED ORDER — METFORMIN HCL 1000 MG PO TABS: 1000.0000 mg | ORAL_TABLET | Freq: Two times a day (BID) | ORAL | 3 refills | Status: DC

## 2017-11-14 MED ORDER — LEVOTHYROXINE SODIUM 200 MCG PO TABS: 200.0000 ug | ORAL_TABLET | Freq: Every day | ORAL | 3 refills | Status: DC

## 2017-11-14 MED ORDER — GABAPENTIN 300 MG PO CAPS: 600.0000 mg | ORAL_CAPSULE | Freq: Three times a day (TID) | ORAL | 3 refills | Status: DC

## 2017-11-14 NOTE — Progress Notes (Signed)
F/u DM [id number]  Joe Wallace [phone]

## 2017-11-14 NOTE — Patient Instructions (Addendum)
Diabetes Mellitus and Nutrition When you have diabetes (diabetes mellitus), it is very important to have healthy eating habits because your blood sugar (glucose) levels are greatly affected by what you eat and drink. Eating healthy foods in the appropriate amounts, at about the same times every day, can help you:  Control your blood glucose.  Lower your risk of heart disease.  Improve your blood pressure.  Reach or maintain a healthy weight.  Every person with diabetes is different, and each person has different needs for a meal plan. Your health care provider may recommend that you work with a diet and nutrition specialist (dietitian) to make a meal plan that is best for you. Your meal plan may vary depending on factors such as:  The calories you need.  The medicines you take.  Your weight.  Your blood glucose, blood pressure, and cholesterol levels.  Your activity level.  Other health conditions you have, such as heart or kidney disease.  How do carbohydrates affect me? Carbohydrates affect your blood glucose level more than any other type of food. Eating carbohydrates naturally increases the amount of glucose in your blood. Carbohydrate counting is a method for keeping track of how many carbohydrates you eat. Counting carbohydrates is important to keep your blood glucose at a healthy level, especially if you use insulin or take certain oral diabetes medicines. It is important to know how many carbohydrates you can safely have in each meal. This is different for every person. Your dietitian can help you calculate how many carbohydrates you should have at each meal and for snack. Foods that contain carbohydrates include:  Bread, cereal, rice, pasta, and crackers.  Potatoes and corn.  Peas, beans, and lentils.  Milk and yogurt.  Fruit and juice.  Desserts, such as cakes, cookies, ice cream, and candy.  How does alcohol affect me? Alcohol can cause a sudden decrease in blood  glucose (hypoglycemia), especially if you use insulin or take certain oral diabetes medicines. Hypoglycemia can be a life-threatening condition. Symptoms of hypoglycemia (sleepiness, dizziness, and confusion) are similar to symptoms of having too much alcohol. If your health care provider says that alcohol is safe for you, follow these guidelines:  Limit alcohol intake to no more than 1 drink per day for nonpregnant women and 2 drinks per day for men. One drink equals 12 oz of beer, 5 oz of wine, or 1 oz of hard liquor.  Do not drink on an empty stomach.  Keep yourself hydrated with water, diet soda, or unsweetened iced tea.  Keep in mind that regular soda, juice, and other mixers may contain a lot of sugar and must be counted as carbohydrates.  What are tips for following this plan? Reading food labels  Start by checking the serving size on the label. The amount of calories, carbohydrates, fats, and other nutrients listed on the label are based on one serving of the food. Many foods contain more than one serving per package.  Check the total grams (g) of carbohydrates in one serving. You can calculate the number of servings of carbohydrates in one serving by dividing the total carbohydrates by 15. For example, if a food has 30 g of total carbohydrates, it would be equal to 2 servings of carbohydrates.  Check the number of grams (g) of saturated and trans fats in one serving. Choose foods that have low or no amount of these fats.  Check the number of milligrams (mg) of sodium in one serving. Most people   should limit total sodium intake to less than 2,300 mg per day.  Always check the nutrition information of foods labeled as "low-fat" or "nonfat". These foods may be higher in added sugar or refined carbohydrates and should be avoided.  Talk to your dietitian to identify your daily goals for nutrients listed on the label. Shopping  Avoid buying canned, premade, or processed foods. These  foods tend to be high in fat, sodium, and added sugar.  Shop around the outside edge of the grocery store. This includes fresh fruits and vegetables, bulk grains, fresh meats, and fresh dairy. Cooking  Use low-heat cooking methods, such as baking, instead of high-heat cooking methods like deep frying.  Cook using healthy oils, such as olive, canola, or sunflower oil.  Avoid cooking with butter, cream, or high-fat meats. Meal planning  Eat meals and snacks regularly, preferably at the same times every day. Avoid going long periods of time without eating.  Eat foods high in fiber, such as fresh fruits, vegetables, beans, and whole grains. Talk to your dietitian about how many servings of carbohydrates you can eat at each meal.  Eat 4-6 ounces of lean protein each day, such as lean meat, chicken, fish, eggs, or tofu. 1 ounce is equal to 1 ounce of meat, chicken, or fish, 1 egg, or 1/4 cup of tofu.  Eat some foods each day that contain healthy fats, such as avocado, nuts, seeds, and fish. Lifestyle   Check your blood glucose regularly.  Exercise at least 30 minutes 5 or more days each week, or as told by your health care provider.  Take medicines as told by your health care provider.  Do not use any products that contain nicotine or tobacco, such as cigarettes and e-cigarettes. If you need help quitting, ask your health care provider.  Work with a Social worker or diabetes educator to identify strategies to manage stress and any emotional and social challenges. What are some questions to ask my health care provider?  Do I need to meet with a diabetes educator?  Do I need to meet with a dietitian?  What number can I call if I have questions?  When are the best times to check my blood glucose? Where to find more information:  American Diabetes Association: diabetes.org/food-and-fitness/food  Academy of Nutrition and Dietetics:  PokerClues.dk  [employer] of Diabetes and Digestive and Kidney Diseases (NIH): ContactWire.be Summary  A healthy meal plan will help you control your blood glucose and maintain a healthy lifestyle.  Working with a diet and nutrition specialist (dietitian) can help you make a meal plan that is best for you.  Keep in mind that carbohydrates and alcohol have immediate effects on your blood glucose levels. It is important to count carbohydrates and to use alcohol carefully. This information is not intended to replace advice given to you by your health care provider. Make sure you discuss any questions you have with your health care provider. Document Released: 06/08/2005 Document Revised: 10/16/2016 Document Reviewed: 10/16/2016 Elsevier Interactive Patient Education  2018 [employer]. Glipizide Extended-release tablets What is this medicine? GLIPIZIDE (GLIP i zide) helps to treat type 2 diabetes. It is combined with diet and exercise. The medicine helps your body to use insulin better. This medicine may be used for other purposes; ask your health care provider or pharmacist if you have questions. COMMON BRAND NAME(S): Glucotrol XL What should I tell my health care provider before I take this medicine? They need to know if you have  any of these conditions: -diabetic ketoacidosis -glucose-6-phosphate dehydrogenase deficiency -heart disease -kidney disease -liver disease -porphyria -severe infection or injury -thyroid disease -an unusual or allergic reaction to glipizide, sulfa drugs, other medicines, foods, dyes, or preservatives -pregnant or trying to get pregnant -breast-feeding How should I use this medicine? Take this medicine by mouth. Follow the directions on the prescription label. Swallow the tablets with a drink of water and take with your breakfast.  Take your medicine at the same time each day. Do not take more often than directed. Talk to your pediatrician regarding the use of this medicine in children. Special care may be needed. Elderly patients over 49 years old may have a stronger reaction and need a smaller dose. Overdosage: If you think you have taken too much of this medicine contact a poison control center or emergency room at once. NOTE: This medicine is only for you. Do not share this medicine with others. What if I miss a dose? If you miss a dose, take it as soon as you can. If it is almost time for your next dose, take only that dose. Do not take double or extra doses. What may interact with this medicine? -bosentan -chloramphenicol -cisapride -medicines for fungal or yeast infections -metoclopramide -probenecid -warfarin Many medications may cause an increase or decrease in blood sugar, these include: -alcohol containing beverages -aspirin and aspirin-like drugs -chloramphenicol -chromium -clarithromycin -male hormones, like estrogens or progestins and birth control pills -heart medicines -isoniazid -male hormones or anabolic steroids -medicines for weight loss -medicines for allergies, asthma, cold, or cough -medicines for mental problems -medicines called MAO Inhibitors like Nardil, Parnate, Marplan, Eldepryl -niacin -NSAIDs, medicines for pain and inflammation, like ibuprofen or naproxen -pentamidine -phenytoin -probenecid -quinolone antibiotics like ciprofloxacin, levofloxacin, ofloxacin -some herbal dietary supplements -steroid medicines like prednisone or cortisone -thyroid medicine -water pills or diuretics This list may not describe all possible interactions. Give your health care provider a list of all the medicines, herbs, non-prescription drugs, or dietary supplements you use. Also tell them if you smoke, drink alcohol, or use illegal drugs. Some items may interact with your medicine. What  should I watch for while using this medicine? Visit your doctor or health care professional for regular checks on your progress. A test called the HbA1C (A1C) will be monitored. This is a simple blood test. It measures your blood sugar control over the last 2 to 3 months. You will receive this test every 3 to 6 months. Learn how to check your blood sugar. Learn the symptoms of low and high blood sugar and how to manage them. Always carry a quick-source of sugar with you in case you have symptoms of low blood sugar. Examples include hard sugar candy or glucose tablets. Make sure others know that you can choke if you eat or drink when you develop serious symptoms of low blood sugar, such as seizures or unconsciousness. They must get medical help at once. Tell your doctor or health care professional if you have high blood sugar. You might need to change the dose of your medicine. If you are sick or exercising more than usual, you might need to change the dose of your medicine. Do not skip meals. Ask your doctor or health care professional if you should avoid alcohol. Many nonprescription cough and cold products contain sugar or alcohol. These can affect blood sugar. This medicine can make you more sensitive to the sun. Keep out of the sun. If you cannot avoid being in the  sun, wear protective clothing and use sunscreen. Do not use sun lamps or tanning beds/booths. Wear a medical ID bracelet or chain, and carry a card that describes your disease and details of your medicine and dosage times. What side effects may I notice from receiving this medicine? Side effects that you should report to your doctor or health care professional as soon as possible: -allergic reactions like skin rash, itching or hives, swelling of the face, lips, or tongue -breathing problems -dark urine -fever, chills, sore throat -signs and symptoms of low blood sugar such as feeling anxious, confusion, dizziness, increased hunger,  unusually weak or tired, sweating, shakiness, cold, irritable, headache, blurred vision, fast heartbeat, loss of consciousness -unusual bleeding or bruising -yellowing of the eyes or skin Side effects that usually do not require medical attention (report to your doctor or health care professional if they continue or are bothersome): -diarrhea -dizziness -headache -heartburn -nausea -stomach gas This list may not describe all possible side effects. Call your doctor for medical advice about side effects. You may report side effects to FDA at 1-800-FDA-1088. Where should I keep my medicine? Keep out of the reach of children. Store at room temperature between 15 to 30 degrees C (59 to 86 degrees F). Protect from moisture and humidity. Throw away any unused medicine after the expiration date. NOTE: This sheet is a summary. It may not cover all possible information. If you have questions about this medicine, talk to your doctor, pharmacist, or health care provider.  2018 Elsevier/Gold Standard (2012-12-25 14:32:16)

## 2017-11-14 NOTE — Progress Notes (Signed)
Subjective:  Patient ID: Joe Wallace, male    DOB: May 29, 1961  Age: 57 y.o. MRN: [id number]  CC: No chief complaint on file.  HPI Joe Wallace is a 57 year-old male with a PMH significant for T2DM, diabetic neuropathy, and hypothyroidism who presents today for follow-up.  Today, he c/o of hearing loss and short-term memory changes. He states that he can hear noises up close, but if someone calls him from far away he will not hear them. Memory changes have been very subtle. He says he will forget little things that his wife tells him to do. He moved to a new home in July and one time he ended up driving to his previous home. Denies headaches or visual changes.     Reports concern over blood sugar readings at home. He checks them once a day and they range between 240s-260s fasting. Numbness and tingling of bilateral feet has increased, gabapentin has not provided relief. He has not had an eye or foot exam, referral was placed at last visit but he never received a call back.  Upon further investigation of medication regimen, patient reported not taking glipizide because he didn't know what the medication was for. Endorses adherence to the rest of his medications. Denies chest pain, SOB, palpitations,or pedal edema.   Interpreter Dorris Carnes [phone] was used for the entire visit.    Past Medical History:  Diagnosis Date  . Arthritis   . Diabetes mellitus without complication ([place])   . Hyperlipidemia   . Thyroid neoplasm   . Visual disorder    LEFT EYE   Past Surgical History:  Procedure Laterality Date  . SHOULDER ARTHROSCOPY    . THYROIDECTOMY N/A 08/13/2015   Procedure: TOTAL THYROIDECTOMY;  Surgeon: Armandina Gemma, MD;  Location: [facility];  Service: General;  Laterality: N/A;   No Known Allergies   Outpatient Medications Prior to Visit  Medication Sig Dispense Refill  . atorvastatin (LIPITOR) 40 MG tablet Take 1 tablet (40 mg total) daily by mouth. 90 tablet 3  . fenofibrate (TRICOR)  145 MG tablet Take 1 tablet (145 mg total) daily by mouth. 90 tablet 3  . gabapentin (NEURONTIN) 300 MG capsule Take 1 capsule (300 mg total) 3 (three) times daily by mouth. 270 capsule 3  . glipiZIDE (GLUCOTROL XL) 10 MG 24 hr tablet Take 1 tablet (10 mg total) daily with breakfast by mouth. 90 tablet 3  . levothyroxine (SYNTHROID, LEVOTHROID) 150 MCG tablet Take 1 tablet (150 mcg total) daily by mouth. 90 tablet 3  . metFORMIN (GLUCOPHAGE) 1000 MG tablet Take 1 tablet (1,000 mg total) 2 (two) times daily with a meal by mouth. 180 tablet 3  . ACCU-CHEK AVIVA PLUS test strip USE AS INSTRUCTED 100 each 12  . ACCU-CHEK SOFTCLIX LANCETS lancets   0  . acetaminophen-codeine (TYLENOL #3) 300-30 MG tablet Take 1 tablet every 4 (four) hours as needed by mouth. 60 tablet 1  . albuterol (PROVENTIL HFA;VENTOLIN HFA) 108 (90 Base) MCG/ACT inhaler Inhale 2 puffs into the lungs every 6 (six) hours as needed for wheezing or shortness of breath. 1 Inhaler 0  . aspirin 81 MG tablet Take 81 mg by mouth daily.    . fluocinonide (LIDEX) 0.05 % external solution Apply 1 application topically 2 (two) times daily. 60 mL 0  . MINOXIDIL, TOPICAL, (MINOXIDIL FOR MEN) 5 % SOLN Apply to affected area once daily 1 Bottle 0  . triamcinolone ointment (KENALOG) 0.5 % Apply 1 application topically  2 (two) times daily. 60 g 1  . Vitamin D, Ergocalciferol, (DRISDOL) 50000 units CAPS capsule Take 1 capsule (50,000 Units total) by mouth every 7 (seven) days. (Patient not taking: Reported on 11/14/2017) 12 capsule 3  . ACCU-CHEK SOFTCLIX LANCETS lancets USE AS INSTRUCTED 100 each 12   No facility-administered medications prior to visit.     ROS Review of Systems  Constitutional: Negative.   HENT: Positive for hearing loss.   Respiratory: Negative for cough and shortness of breath.   Cardiovascular: Negative for chest pain, palpitations and leg swelling.  Gastrointestinal: Negative.   Genitourinary: Negative.   Musculoskeletal:  Negative for gait problem.  Skin: Negative.   Neurological: Positive for numbness (of bilateral feet. ). Negative for dizziness, weakness and headaches.       Some memory loss   Psychiatric/Behavioral: Negative.     Objective:  BP 125/83 (BP Location: Right Arm, Cuff Size: Large)   Pulse 78   Temp 98.7 F (37.1 C) (Oral)   Resp 12   Wt 103.9 kg (229 lb)   SpO2 97%   BMI 32.86 kg/m   Lab Results  Component Value Date   POCGLU 321 (A) 11/14/2017   POCGLU 305 (A) 08/08/2017   POCGLU 218 (A) 04/25/2017   Lab Results  Component Value Date   HGBA1C 11.0 (H) 11/14/2017    Physical Exam  Constitutional: He is oriented to person, place, and time. He appears well-developed and well-nourished.  HENT:  Head: Normocephalic.  Right Ear: Tympanic membrane and external ear normal.  Left Ear: Tympanic membrane and external ear normal.  Mouth/Throat: Oropharynx is clear and moist.  Bilateral ears have small amount of cerumen, no obstruction noted.   Neck: Normal range of motion. Neck supple.  Cardiovascular: Normal rate, regular rhythm and normal heart sounds.  Pulmonary/Chest: Effort normal and breath sounds normal. No respiratory distress.  Abdominal: Soft. Bowel sounds are normal.  Musculoskeletal: Normal range of motion.  Reduce sensation on dorsal side of bilateral feet  Neurological: He is alert and oriented to person, place, and time.  Skin: Skin is warm and dry.  Psychiatric: He has a normal mood and affect. His behavior is normal.   Assessment & Plan:   1. Type 2 diabetes mellitus without complication, without long-term current use of insulin (HCC) Uncontrolled.  -Stressed the importance of taking glipizide, patient was not taking this at home. No changes at this time, will re-evaluate if changes needed at next visit.  -Suspect short term memory loss due to uncontrolled DM, will refer to neurology if it continues to be a problem.  - Glucose (CBG) - insulin aspart  (novoLOG) injection 10 Units - Hemoglobin A1c - metFORMIN (GLUCOPHAGE) 1000 MG tablet; Take 1 tablet (1,000 mg total) by mouth 2 (two) times daily with a meal.  Dispense: 180 tablet; Refill: 3 - Ambulatory referral to Ophthalmology - glipiZIDE (GLUCOTROL XL) 10 MG 24 hr tablet; Take 1 tablet (10 mg total) by mouth daily with breakfast.  Dispense: 90 tablet; Refill: 3  2. Hypertriglyceridemia Lipid panel needed at next visit.  - fenofibrate (TRICOR) 145 MG tablet; Take 1 tablet (145 mg total) by mouth daily.  Dispense: 90 tablet; Refill: 3  3. Postoperative hypothyroidism TSH was 0.5 (11/18). NO changes at this time - levothyroxine (SYNTHROID, LEVOTHROID) 200 MCG tablet; Take 1 tablet (200 mcg total) by mouth daily.  Dispense: 30 tablet; Refill: 3  4. Other diabetic neurological complication associated with type 2 diabetes mellitus (HCC) Increased gabapentin  from 300 to 600 mg TID. - gabapentin (NEURONTIN) 300 MG capsule; Take 2 capsules (600 mg total) by mouth 3 (three) times daily.  Dispense: 270 capsule; Refill: 3  5. Dyslipidemia Continue Lipitor, refilled. - atorvastatin (LIPITOR) 40 MG tablet; Take 1 tablet (40 mg total) by mouth daily.  Dispense: 90 tablet; Refill: 3  6. Hearing deficit, bilateral - Ambulatory referral to ENT   Meds ordered this encounter  Medications  . insulin aspart (novoLOG) injection 10 Units  . metFORMIN (GLUCOPHAGE) 1000 MG tablet    Sig: Take 1 tablet (1,000 mg total) by mouth 2 (two) times daily with a meal.    Dispense:  180 tablet    Refill:  3  . gabapentin (NEURONTIN) 300 MG capsule    Sig: Take 2 capsules (600 mg total) by mouth 3 (three) times daily.    Dispense:  270 capsule    Refill:  3  . levothyroxine (SYNTHROID, LEVOTHROID) 200 MCG tablet    Sig: Take 1 tablet (200 mcg total) by mouth daily.    Dispense:  30 tablet    Refill:  3  . glipiZIDE (GLUCOTROL XL) 10 MG 24 hr tablet    Sig: Take 1 tablet (10 mg total) by mouth daily with  breakfast.    Dispense:  90 tablet    Refill:  3  . fenofibrate (TRICOR) 145 MG tablet    Sig: Take 1 tablet (145 mg total) by mouth daily.    Dispense:  90 tablet    Refill:  3  . atorvastatin (LIPITOR) 40 MG tablet    Sig: Take 1 tablet (40 mg total) by mouth daily.    Dispense:  90 tablet    Refill:  3  . ACCU-CHEK SOFTCLIX LANCETS lancets    Sig: USE AS INSTRUCTED    Dispense:  100 each    Refill:  12    Follow-up: Return in about 3 months (around 02/11/2018) for Hemoglobin A1C and Follow up, DM, Follow up Pain and comorbidities.    Jonnie Kind DNP student  Evaluation and management procedures were performed by me with DNP Student in attendance, note written by DNP student under my supervision and collaboration. I have reviewed the note and I agree with the management and plan.   Angelica Chessman, MD, [place], [place], Karilyn Cota [facility] and [facility] [place], [place]   11/18/2017, 7:44 PM

## 2017-11-15 LAB — HEMOGLOBIN A1C
Est. average glucose Bld gHb Est-mCnc: 269 mg/dL
HEMOGLOBIN A1C: 11 % — AB (ref 4.8–5.6)

## 2017-11-22 ENCOUNTER — Encounter: Payer: Self-pay | Admitting: Podiatry

## 2017-11-22 ENCOUNTER — Ambulatory Visit: Payer: Medicaid Other | Admitting: Podiatry

## 2017-11-22 DIAGNOSIS — E1142 Type 2 diabetes mellitus with diabetic polyneuropathy: Secondary | ICD-10-CM

## 2017-11-22 DIAGNOSIS — M792 Neuralgia and neuritis, unspecified: Secondary | ICD-10-CM

## 2017-11-22 MED ORDER — NONFORMULARY OR COMPOUNDED ITEM: 1.0000 g | Freq: Four times a day (QID) | 2 refills | Status: DC

## 2017-12-06 ENCOUNTER — Other Ambulatory Visit: Payer: Self-pay | Admitting: Internal Medicine

## 2017-12-06 MED FILL — ACCU-CHEK AVIVA PLUS TEST S: 25 days supply | Qty: 100 | Fill #0

## 2018-01-29 ENCOUNTER — Ambulatory Visit: Payer: Medicaid Other | Attending: Nurse Practitioner | Admitting: Nurse Practitioner

## 2018-01-29 ENCOUNTER — Encounter: Payer: Self-pay | Admitting: Nurse Practitioner

## 2018-01-29 VITALS — BP 133/84 | HR 74 | Temp 98.7°F | Resp 12 | Ht 70.0 in | Wt 238.0 lb

## 2018-01-29 DIAGNOSIS — E039 Hypothyroidism, unspecified: Secondary | ICD-10-CM

## 2018-01-29 DIAGNOSIS — Z8249 Family history of ischemic heart disease and other diseases of the circulatory system: Secondary | ICD-10-CM | POA: Insufficient documentation

## 2018-01-29 DIAGNOSIS — R0602 Shortness of breath: Secondary | ICD-10-CM | POA: Diagnosis not present

## 2018-01-29 DIAGNOSIS — Z79890 Hormone replacement therapy (postmenopausal): Secondary | ICD-10-CM | POA: Insufficient documentation

## 2018-01-29 DIAGNOSIS — E119 Type 2 diabetes mellitus without complications: Secondary | ICD-10-CM | POA: Diagnosis present

## 2018-01-29 DIAGNOSIS — Z760 Encounter for issue of repeat prescription: Secondary | ICD-10-CM | POA: Diagnosis not present

## 2018-01-29 DIAGNOSIS — Z7982 Long term (current) use of aspirin: Secondary | ICD-10-CM | POA: Diagnosis not present

## 2018-01-29 DIAGNOSIS — Z79899 Other long term (current) drug therapy: Secondary | ICD-10-CM | POA: Insufficient documentation

## 2018-01-29 DIAGNOSIS — E785 Hyperlipidemia, unspecified: Secondary | ICD-10-CM | POA: Insufficient documentation

## 2018-01-29 DIAGNOSIS — C750 Malignant neoplasm of parathyroid gland: Secondary | ICD-10-CM | POA: Diagnosis not present

## 2018-01-29 DIAGNOSIS — Z7984 Long term (current) use of oral hypoglycemic drugs: Secondary | ICD-10-CM | POA: Insufficient documentation

## 2018-01-29 DIAGNOSIS — E782 Mixed hyperlipidemia: Secondary | ICD-10-CM

## 2018-01-29 LAB — GLUCOSE, POCT (MANUAL RESULT ENTRY): POC GLUCOSE: 276 mg/dL — AB (ref 70–99)

## 2018-01-29 LAB — POCT GLYCOSYLATED HEMOGLOBIN (HGB A1C): HEMOGLOBIN A1C: 8.2

## 2018-01-29 MED ORDER — ACCU-CHEK SOFTCLIX LANCETS MISC: 12 refills | Status: DC

## 2018-01-29 MED ORDER — LEVOTHYROXINE SODIUM 200 MCG PO TABS: 200.0000 ug | ORAL_TABLET | Freq: Every day | ORAL | 3 refills | Status: DC

## 2018-01-29 MED ORDER — MUPIROCIN 2 % EX OINT: 1.0000 "application " | TOPICAL_OINTMENT | Freq: Two times a day (BID) | CUTANEOUS | 0 refills | Status: DC

## 2018-01-29 MED ORDER — GLUCOSE BLOOD VI STRP: ORAL_STRIP | 12 refills | Status: DC

## 2018-01-29 MED ORDER — ALBUTEROL SULFATE HFA 108 (90 BASE) MCG/ACT IN AERS: 2.0000 | INHALATION_SPRAY | Freq: Four times a day (QID) | RESPIRATORY_TRACT | 2 refills | Status: DC | PRN

## 2018-01-29 MED ORDER — GLIPIZIDE ER 10 MG PO TB24: 10.0000 mg | ORAL_TABLET | Freq: Every day | ORAL | 3 refills | Status: DC

## 2018-01-29 NOTE — Progress Notes (Signed)
Assessment & Plan:  Joe Wallace was seen today for establish care and medication refill.  Diagnoses and all orders for this visit:  Type 2 diabetes mellitus without complication, without long-term current use of insulin (HCC) -     Glucose (CBG) -     HgB A1c -     CMP14+EGFR -     glipiZIDE (GLUCOTROL XL) 10 MG 24 hr tablet; Take 1 tablet (10 mg total) by mouth daily with breakfast. -     ACCU-CHEK SOFTCLIX LANCETS lancets; USE AS INSTRUCTED -     glucose blood (ACCU-CHEK AVIVA PLUS) test strip; USE AS INSTRUCTED  Continue blood sugar control as discussed in office today, low carbohydrate diet, and regular physical exercise as tolerated, 150 minutes per week (30 min each day, 5 days per week, or 50 min 3 days per week). Keep blood sugar logs with fasting goal of 80-130 mg/dl, post prandial less than 180.  For Hypoglycemia: BS <60 and Hyperglycemia BS >400; contact the clinic ASAP. Annual eye exams and foot exams are recommended.   Hypothyroidism, unspecified type -     TSH -     levothyroxine (SYNTHROID, LEVOTHROID) 200 MCG tablet; Take 1 tablet (200 mcg total) by mouth daily.  Dyspnea -     albuterol (PROVENTIL HFA;VENTOLIN HFA) 108 (90 Base) MCG/ACT inhaler; Inhale 2 puffs into the lungs every 6 (six) hours as needed for wheezing or shortness of breath. He endorses periods of shortness of breath. He is requesting a refill of his previous albuterol inhaler. Exam was negative today however I will refill SABA.   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 No fried foods. No junk foods, sodas, sugary drinks, unhealthy snacking, alcohol or smoking.      Patient has been counseled on age-appropriate routine health concerns for screening and prevention. These are reviewed and up-to-date.  Referrals have been placed accordingly. Immunizations are up-to-date or declined.    Subjective:   Chief Complaint  Patient presents with  . Establish Care    Pt. is here to establish care for diabetes and thyroid.   . Medication Refill   HPI Joe Wallace 57 y.o. male presents to office today to establish care. [name] was used to communicate directly with patient for the entire encounter including providing detailed patient instructions.    Diabetes Mellitus Type 2 Chronic. Controlled but A1c not at goal. Endorses medication compliance taking glipizide 23m and metformin 1000 mg BID. Endorses memory loss/forgetfulness and diabetic neuropathy. Denies any hypo or hyperglycemic symptoms. Blood sugars range from 130-180s. He states his eye exam is current with last office visit in February 2019. Lab Results  Component Value Date   HGBA1C 8.2 01/29/2018    Hypothyroidism Being managed by Endocrinology. His hypothyroidism is a result of thyroidectomy secondary to parathyroid carcinoma. TSH is not at goal. Will repeat in a few weeks Lab Results  Component Value Date   TSH 0.030 (L) 01/29/2018   Hyperlipidemia Patient presents for follow up to hyperlipidemia.  He is medication compliant.  However he endorses memory issues so I am not sure if he is taking all of his medications as prescribed.  He is diet compliant and denies chest pain, exertional chest pressure/discomfort and skin xanthelasma or statin intolerance including myalgias. LDL is not at goal. Need fasting lipid  panel. Patient instructed to continue atorvastatin as prescribed.  Lab Results  Component Value Date   CHOL 258 (H) 08/08/2017   Lab Results  Component Value Date   HDL 35 (L) 08/08/2017   Lab Results  Component Value Date   LDLCALC Comment 08/08/2017   Lab Results  Component Value Date   TRIG 610 (HH) 08/08/2017   Lab Results  Component Value Date   CHOLHDL 7.4 (H) 08/08/2017   Review of Systems    Constitutional: Negative for fever, malaise/fatigue and weight loss.  HENT: Negative.  Negative for nosebleeds.   Eyes: Positive for blurred vision. Negative for double vision and photophobia.  Respiratory: Positive for cough and shortness of breath. Negative for hemoptysis, sputum production and wheezing.   Cardiovascular: Negative.  Negative for chest pain, palpitations and leg swelling.  Gastrointestinal: Negative.  Negative for heartburn, nausea and vomiting.  Musculoskeletal: Positive for joint pain (OA). Negative for myalgias.  Neurological: Negative.  Negative for dizziness, focal weakness, seizures and headaches.  Psychiatric/Behavioral: Negative.  Negative for suicidal ideas.    Past Medical History:  Diagnosis Date  . Arthritis   . Diabetes mellitus without complication (HCC)   . Hyperlipidemia   . Thyroid neoplasm   . Visual disorder    LEFT EYE    Past Surgical History:  Procedure Laterality Date  . SHOULDER ARTHROSCOPY    . THYROIDECTOMY N/A 08/13/2015   Procedure: TOTAL THYROIDECTOMY;  Surgeon: Todd Gerkin, MD;  Location: [facility];  Service: General;  Laterality: N/A;    Family History  Problem Relation Age of Onset  . Diabetes Mother   . Hypertension Mother   . Hypertension Father   . Diabetes Father     Social History Reviewed with no changes to be made today.   Outpatient Medications Prior to Visit  Medication Sig Dispense Refill  . acetaminophen-codeine (TYLENOL #3) 300-30 MG tablet Take 1 tablet every 4 (four) hours as needed by mouth. 60 tablet 1  . atorvastatin (LIPITOR) 40 MG tablet Take 1 tablet (40 mg total) by mouth daily. 90 tablet 3  . fenofibrate (TRICOR) 145 MG tablet Take 1 tablet (145 mg total) by mouth daily. 90 tablet 3  . fluocinonide (LIDEX) 0.05 % external solution Apply 1 application topically 2 (two) times daily. 60 mL 0  . gabapentin (NEURONTIN) 300 MG capsule Take 2 capsules (600 mg total) by mouth 3 (three) times daily. 270 capsule  3  . metFORMIN (GLUCOPHAGE) 1000 MG tablet Take 1 tablet (1,000 mg total) by mouth 2 (two) times daily with a meal. 180 tablet 3  . ACCU-CHEK AVIVA PLUS test strip USE AS INSTRUCTED 100 each 12  . ACCU-CHEK SOFTCLIX LANCETS lancets USE AS INSTRUCTED 100 each 12  . albuterol (PROVENTIL HFA;VENTOLIN HFA) 108 (90 Base) MCG/ACT inhaler Inhale 2 puffs into the lungs every 6 (six) hours as needed for wheezing or shortness of breath. 1 Inhaler 0  . glipiZIDE (GLUCOTROL XL) 10 MG 24 hr tablet Take 1 tablet (10 mg total) by mouth daily with breakfast. 90 tablet 3  . levothyroxine (SYNTHROID, LEVOTHROID) 200 MCG tablet Take 1 tablet (200 mcg total) by mouth daily. 30 tablet 3  . ACCU-CHEK SOFTCLIX LANCETS lancets   0  . aspirin 81 MG tablet Take 81 mg by mouth daily.    . MINOXIDIL, TOPICAL, (MINOXIDIL FOR MEN) 5 % SOLN Apply to affected area once daily (Patient not taking: Reported on 01/29/2018) 1 Bottle 0  . NONFORMULARY OR COMPOUNDED ITEM Apply   1-2 g topically 4 (four) times daily. Bupivacaine 1 %/ Doxepin 3%/ Gabapentin 6%/ Pentoxifyline 3%/ Topiramate 1% (Patient not taking: Reported on 01/29/2018) 120 each 2  . triamcinolone ointment (KENALOG) 0.5 % Apply 1 application topically 2 (two) times daily. (Patient not taking: Reported on 01/29/2018) 60 g 1  . Vitamin D, Ergocalciferol, (DRISDOL) 50000 units CAPS capsule Take 1 capsule (50,000 Units total) by mouth every 7 (seven) days. (Patient not taking: Reported on 11/14/2017) 12 capsule 3   No facility-administered medications prior to visit.     No Known Allergies     Objective:    BP 133/84 (BP Location: Left Arm, Patient Position: Sitting, Cuff Size: Normal)   Pulse 74   Temp 98.7 F (37.1 C) (Oral)   Ht 5' 10" (1.778 m)   Wt 238 lb (108 kg)   SpO2 95%   BMI 34.15 kg/m  Wt Readings from Last 3 Encounters:  01/29/18 238 lb (108 kg)  11/14/17 229 lb (103.9 kg)  10/11/17 229 lb (103.9 kg)    Physical Exam  Constitutional: He is oriented to  person, place, and time. He appears well-developed and well-nourished. He is cooperative.  HENT:  Head: Normocephalic and atraumatic.  Eyes: EOM are normal.  Neck: Normal range of motion.  Cardiovascular: Normal rate, regular rhythm and normal heart sounds. Exam reveals no gallop and no friction rub.  No murmur heard. Pulmonary/Chest: Effort normal and breath sounds normal. No stridor. No tachypnea. No respiratory distress. He has no decreased breath sounds. He has no wheezes. He has no rhonchi. He has no rales. He exhibits no tenderness.  No increased resp effort noted  Abdominal: Soft. Bowel sounds are normal.  Musculoskeletal: Normal range of motion. He exhibits no edema.  Neurological: He is alert and oriented to person, place, and time. Coordination normal.  Skin: Skin is warm and dry.  Psychiatric: He has a normal mood and affect. His behavior is normal. Judgment and thought content normal.  Nursing note and vitals reviewed.        Patient has been counseled extensively about nutrition and exercise as well as the importance of adherence with medications and regular follow-up. The patient was given clear instructions to go to ER or return to medical center if symptoms don't improve, worsen or new problems develop. The patient verbalized understanding.   Follow-up: Return in about 3 months (around 05/01/2018) for DM f/u.   Gildardo Pounds, FNP-BC [facility] [place], [place]   01/31/2018, 7:50 PM

## 2018-01-29 NOTE — Patient Instructions (Signed)
Diabetes blood sugar goals  Fasting in AM before breakfast which means at least 6- 8 hrs of no eating or drinking) except water or unsweetened coffee or tea): 90-130 2 hrs after meals: < 180,   Hypoglycemia or low blood sugar: < 70 (You should not have hypoglycemia.)  Aim for 30 minutes of exercise most days. Rethink what you drink. Water is great! Aim for 2-3 Carb Choices per meal (30-45 grams) +/- 1 either way  Aim for 0-15 Carbs per snack if hungry  Include protein in moderation with your meals and snacks  Consider reading food labels for Total Carbohydrate and Fat Grams of foods  Consider checking BG at alternate times per day  Continue taking medication as directed Be mindful about how much sugar you are adding to beverages and other foods. Fruit Punch - find one with no sugar  Measure and decrease portions of carbohydrate foods  Make your plate and don't go back for seconds

## 2018-01-30 LAB — CMP14+EGFR
ALBUMIN: 4.2 g/dL (ref 3.5–5.5)
ALT: 19 IU/L (ref 0–44)
AST: 13 IU/L (ref 0–40)
Albumin/Globulin Ratio: 1.4 (ref 1.2–2.2)
Alkaline Phosphatase: 88 IU/L (ref 39–117)
BUN / CREAT RATIO: 19 (ref 9–20)
BUN: 16 mg/dL (ref 6–24)
Bilirubin Total: 0.2 mg/dL (ref 0.0–1.2)
CALCIUM: 9.1 mg/dL (ref 8.7–10.2)
CO2: 22 mmol/L (ref 20–29)
CREATININE: 0.83 mg/dL (ref 0.76–1.27)
Chloride: 99 mmol/L (ref 96–106)
GFR calc non Af Amer: 96 mL/min/{1.73_m2} (ref >59)
GFR, EST AFRICAN AMERICAN: 111 mL/min/{1.73_m2} (ref >59)
GLOBULIN, TOTAL: 3 g/dL (ref 1.5–4.5)
Glucose: 268 mg/dL — ABNORMAL HIGH (ref 65–99)
Potassium: 4.7 mmol/L (ref 3.5–5.2)
SODIUM: 136 mmol/L (ref 134–144)
TOTAL PROTEIN: 7.2 g/dL (ref 6.0–8.5)

## 2018-01-30 LAB — TSH: TSH: 0.03 u[IU]/mL — ABNORMAL LOW (ref 0.450–4.500)

## 2018-01-31 ENCOUNTER — Encounter: Payer: Self-pay | Admitting: Nurse Practitioner

## 2018-01-31 DIAGNOSIS — C750 Malignant neoplasm of parathyroid gland: Secondary | ICD-10-CM | POA: Insufficient documentation

## 2018-02-01 ENCOUNTER — Telehealth: Payer: Self-pay

## 2018-02-01 NOTE — Telephone Encounter (Signed)
-----   Message from Gildardo Pounds, NP sent at 01/30/2018  8:54 AM EDT ----- TSH is low. Will need you to make a lab appointment in 4 weeks. We may need to make changes in your thyroid medication. Continue to take your synthroid every day at least 30 minutes before you eat or take any other medications.

## 2018-02-01 NOTE — Telephone Encounter (Signed)
CMA attempt to call patient to inform on lab results and to make a lab appt. In 4 weeks.  No answer and left a VM for patient to call back.   Arabic interpreter [phone] assist with the call.   If patient call back, please inform:  TSH is low. Will need you to make a lab appointment in 4 weeks. We may need to make changes in your thyroid medication. Continue to take your synthroid every day at least 30 minutes before you eat or take any other medications.

## 2018-02-01 NOTE — Progress Notes (Signed)
Patient return CMA call. Patient was inform on lab results and is aware to call back to make his lab appt in 4 weeks for her thyroid.   Arabic interpreter assist with the call.

## 2018-04-01 ENCOUNTER — Ambulatory Visit (INDEPENDENT_AMBULATORY_CARE_PROVIDER_SITE_OTHER): Payer: Medicaid Other | Admitting: Otolaryngology

## 2018-04-14 NOTE — Progress Notes (Signed)
Subjective:  Patient ID: Joe Wallace, male    DOB: June 04, 1961,  MRN: [id number] HPI Chief Complaint  Patient presents with  . Numbness    B/L feet - unchanged since last appointment still taking gabapentin     57 y.o. male presents with the above complaint. Still taking gabapentin pain unchanged.  Last A1C 10.1 04/25/17  Past Medical History:  Diagnosis Date  . Arthritis   . Diabetes mellitus without complication ([place])   . Hyperlipidemia   . Thyroid neoplasm   . Visual disorder    LEFT EYE   Past Surgical History:  Procedure Laterality Date  . SHOULDER ARTHROSCOPY    . THYROIDECTOMY N/A 08/13/2015   Procedure: TOTAL THYROIDECTOMY;  Surgeon: Armandina Gemma, MD;  Location: [facility];  Service: General;  Laterality: N/A;    Current Outpatient Medications:  .  ACCU-CHEK SOFTCLIX LANCETS lancets, , Disp: , Rfl: 0 .  ACCU-CHEK SOFTCLIX LANCETS lancets, USE AS INSTRUCTED, Disp: 100 each, Rfl: 12 .  acetaminophen-codeine (TYLENOL #3) 300-30 MG tablet, Take 1 tablet every 4 (four) hours as needed by mouth., Disp: 60 tablet, Rfl: 1 .  albuterol (PROVENTIL HFA;VENTOLIN HFA) 108 (90 Base) MCG/ACT inhaler, Inhale 2 puffs into the lungs every 6 (six) hours as needed for wheezing or shortness of breath., Disp: 1 Inhaler, Rfl: 2 .  aspirin 81 MG tablet, Take 81 mg by mouth daily., Disp: , Rfl:  .  atorvastatin (LIPITOR) 40 MG tablet, Take 1 tablet (40 mg total) by mouth daily., Disp: 90 tablet, Rfl: 3 .  fenofibrate (TRICOR) 145 MG tablet, Take 1 tablet (145 mg total) by mouth daily., Disp: 90 tablet, Rfl: 3 .  fluocinonide (LIDEX) 0.05 % external solution, Apply 1 application topically 2 (two) times daily., Disp: 60 mL, Rfl: 0 .  gabapentin (NEURONTIN) 300 MG capsule, Take 2 capsules (600 mg total) by mouth 3 (three) times daily., Disp: 270 capsule, Rfl: 3 .  glipiZIDE (GLUCOTROL XL) 10 MG 24 hr tablet, Take 1 tablet (10 mg total) by mouth daily with breakfast., Disp: 90 tablet, Rfl: 3 .   glucose blood (ACCU-CHEK AVIVA PLUS) test strip, USE AS INSTRUCTED, Disp: 100 each, Rfl: 12 .  levothyroxine (SYNTHROID, LEVOTHROID) 200 MCG tablet, Take 1 tablet (200 mcg total) by mouth daily., Disp: 30 tablet, Rfl: 3 .  metFORMIN (GLUCOPHAGE) 1000 MG tablet, Take 1 tablet (1,000 mg total) by mouth 2 (two) times daily with a meal., Disp: 180 tablet, Rfl: 3 .  NONFORMULARY OR COMPOUNDED ITEM, Apply 1-2 g topically 4 (four) times daily. Bupivacaine 1 %/ Doxepin 3%/ Gabapentin 6%/ Pentoxifyline 3%/ Topiramate 1% (Patient not taking: Reported on 01/29/2018), Disp: 120 each, Rfl: 2  No Known Allergies Review of Systems Objective:   There were no vitals filed for this visit. General AA&O x3. Normal mood and affect.  Vascular Dorsalis pedis and posterior tibial pulses  present 2+ bilaterally  Capillary refill normal to all digits. Pedal hair growth normal.  Neurologic Epicritic sensation diminished bilaterally. Protective sensation with 5.07 monofilament  3/5 bilaterally. Vibratory sensation present L, dimished R.  Dermatologic No open lesions. Interspaces clear of maceration.  Normal skin temperature. Xerotic and cracked heels bilat Hyperkeratotic lesions: none bilaterally. Nails: normal- no clubbing or nail changes, not elongated  Orthopedic: No history of amputation. Normal lower extremity joint ROM without pain or crepitus.   Assessment & Plan:  Patient was evaluated and treated and all questions answered.  Diabetes with DPN -Educated on DPN. -Follow up in 6  months for recheck. -Continue Gaba. F/u with PCP for gabapentin dosing.  Return in about 6 months (around 05/22/2018) for Diabetic Foot Care.

## 2018-05-06 ENCOUNTER — Encounter: Payer: Self-pay | Admitting: Nurse Practitioner

## 2018-05-06 ENCOUNTER — Ambulatory Visit: Payer: Medicaid Other | Attending: Nurse Practitioner | Admitting: Nurse Practitioner

## 2018-05-06 VITALS — BP 120/80 | HR 76 | Temp 98.3°F | Ht 70.0 in | Wt 224.4 lb

## 2018-05-06 DIAGNOSIS — E039 Hypothyroidism, unspecified: Secondary | ICD-10-CM | POA: Insufficient documentation

## 2018-05-06 DIAGNOSIS — Z7982 Long term (current) use of aspirin: Secondary | ICD-10-CM | POA: Diagnosis not present

## 2018-05-06 DIAGNOSIS — E119 Type 2 diabetes mellitus without complications: Secondary | ICD-10-CM

## 2018-05-06 DIAGNOSIS — E1149 Type 2 diabetes mellitus with other diabetic neurological complication: Secondary | ICD-10-CM | POA: Diagnosis not present

## 2018-05-06 DIAGNOSIS — Z79899 Other long term (current) drug therapy: Secondary | ICD-10-CM | POA: Insufficient documentation

## 2018-05-06 DIAGNOSIS — Z0000 Encounter for general adult medical examination without abnormal findings: Secondary | ICD-10-CM | POA: Diagnosis not present

## 2018-05-06 DIAGNOSIS — Z7984 Long term (current) use of oral hypoglycemic drugs: Secondary | ICD-10-CM | POA: Diagnosis not present

## 2018-05-06 DIAGNOSIS — M25512 Pain in left shoulder: Secondary | ICD-10-CM | POA: Insufficient documentation

## 2018-05-06 DIAGNOSIS — E1169 Type 2 diabetes mellitus with other specified complication: Secondary | ICD-10-CM | POA: Diagnosis not present

## 2018-05-06 DIAGNOSIS — E782 Mixed hyperlipidemia: Secondary | ICD-10-CM | POA: Diagnosis not present

## 2018-05-06 DIAGNOSIS — G8929 Other chronic pain: Secondary | ICD-10-CM

## 2018-05-06 DIAGNOSIS — Z1211 Encounter for screening for malignant neoplasm of colon: Secondary | ICD-10-CM | POA: Diagnosis not present

## 2018-05-06 DIAGNOSIS — R7989 Other specified abnormal findings of blood chemistry: Secondary | ICD-10-CM | POA: Diagnosis not present

## 2018-05-06 LAB — POCT GLYCOSYLATED HEMOGLOBIN (HGB A1C): HEMOGLOBIN A1C: 10.9 % — AB (ref 4.0–5.6)

## 2018-05-06 LAB — GLUCOSE, POCT (MANUAL RESULT ENTRY): POC GLUCOSE: 257 mg/dL — AB (ref 70–99)

## 2018-05-06 MED ORDER — METFORMIN HCL 1000 MG PO TABS: 1000.0000 mg | ORAL_TABLET | Freq: Two times a day (BID) | ORAL | 3 refills | Status: DC

## 2018-05-06 MED ORDER — IBUPROFEN 800 MG PO TABS: 800.0000 mg | ORAL_TABLET | Freq: Three times a day (TID) | ORAL | 0 refills | Status: DC | PRN

## 2018-05-06 MED ORDER — GABAPENTIN 300 MG PO CAPS: 600.0000 mg | ORAL_CAPSULE | Freq: Three times a day (TID) | ORAL | 3 refills | Status: DC

## 2018-05-06 MED ORDER — GLIPIZIDE ER 10 MG PO TB24: 20.0000 mg | ORAL_TABLET | Freq: Every day | ORAL | 3 refills | Status: DC

## 2018-05-06 MED ORDER — FENOFIBRATE 145 MG PO TABS: 145.0000 mg | ORAL_TABLET | Freq: Every day | ORAL | 3 refills | Status: DC

## 2018-05-06 MED ORDER — ATORVASTATIN CALCIUM 40 MG PO TABS: 40.0000 mg | ORAL_TABLET | Freq: Every day | ORAL | 3 refills | Status: DC

## 2018-05-06 MED ORDER — ACCU-CHEK SOFTCLIX LANCETS MISC
12 refills | Status: DC
Start: 2018-05-06 — End: 2019-01-01

## 2018-05-06 NOTE — Progress Notes (Signed)
Assessment & Plan:  Joe Wallace was seen today for follow-up.  Diagnoses and all orders for this visit:  Type 2 diabetes mellitus with other specified complication, without long-term current use of insulin (HCC) -     Glucose (CBG) -     HgB A1c -     Microalbumin/Creatinine Ratio, Urine -     glipiZIDE (GLUCOTROL XL) 10 MG 24 hr tablet; Take 2 tablets (20 mg total) by mouth daily with breakfast. -     metFORMIN (GLUCOPHAGE) 1000 MG tablet; Take 1 tablet (1,000 mg total) by mouth 2 (two) times daily with a meal. -     ACCU-CHEK SOFTCLIX LANCETS lancets; USE AS INSTRUCTED. Check blood glucose at home by fingerstick 2 (two) times per day. Diabetes is poorly controlled. Advised patient to keep a fasting blood sugar log fast, 2 hours post lunch and bedtime which will be reviewed at the next office visit.  Mixed hypertriglyceridemia -     Lipid panel -     atorvastatin (LIPITOR) 40 MG tablet; Take 1 tablet (40 mg total) by mouth daily. -     fenofibrate (TRICOR) 145 MG tablet; Take 1 tablet (145 mg total) by mouth daily. INSTRUCTIONS: Work on a low fat, heart healthy diet and participate in regular aerobic exercise program by working out at least 150 minutes per week; 5 days a week-30 minutes per day. Avoid red meat, fried foods. junk foods, sodas, sugary drinks, unhealthy snacking, alcohol and smoking.  Drink at least 48oz of water per day and monitor your carbohydrate intake daily.   Other diabetic neurological complication associated with type 2 diabetes mellitus (HCC) -     gabapentin (NEURONTIN) 300 MG capsule; Take 2 capsules (600 mg total) by mouth 3 (three) times daily.  Low TSH level -     T3 -     T4, free -     TSH  Chronic left shoulder pain -     DG Shoulder Left; Future -     ibuprofen (ADVIL,MOTRIN) 800 MG tablet; Take 1 tablet (800 mg total) by mouth every 8 (eight) hours as needed.  Routine adult health maintenance -     Hepatitis C Antibody  Colon cancer screening -      Ambulatory referral to Gastroenterology    Patient has been counseled on age-appropriate routine health concerns for screening and prevention. These are reviewed and up-to-date. Referrals have been placed accordingly. Immunizations are up-to-date or declined.    Subjective:   Chief Complaint  Patient presents with  . Follow-up    Pt. is here to follow-up on diabetes.    HPI Joe Wallace 57 y.o. male presents to office today for follow up to DM Type 2.   Type 2 Diabetes Mellitus Disease course has been worsening. He endorses increased stress constantly worried about his daughter who is in [place] and having marital issues as well as his wife who is currently having some health issues.  There are no hypoglycemic symptoms. There are no hypoglycemic complications. Symptoms are stable. There are diabetic complications. Risk factors for coronary artery disease include family history, dyslipidemia, diabetes mellitus, obesity, hypertension, sedentary lifestyle and stress. Current diabetic treatment includes peripheral neuroapthy. Patient is compliant with treatment all of the time and monitors blood glucose at home 1-2 times per day.   Home blood glucose trend: (FBS 170-200s mg/dl) Postprandial: 220-230. Weight is  stable. Patient follows a generally healthy diet. Meal planning includes avoidance of concentrated sweets. Patient has  not seen a dietician. Patient is not compliant with exercise.   An ACE inhibitor/angiotensin II receptor blocker is not being taken. Patient does not see a podiatrist. Eye exam is not current.  Lab Results  Component Value Date   HGBA1C 10.9 (A) 05/06/2018   Lab Results  Component Value Date   HGBA1C 8.2 01/29/2018   Hyperlipidemia Patient presents for follow up to hyperlipidemia.  He is medication compliant taking atorvastatin 40mg  and fenofibrate 145mg  daily. He is diet compliant and denies skin xanthelasma or statin intolerance including myalgias.  Lab Results    Component Value Date   CHOL 258 (H) 08/08/2017   Lab Results  Component Value Date   HDL 35 (L) 08/08/2017   Lab Results  Component Value Date   LDLCALC Comment 08/08/2017   Lab Results  Component Value Date   TRIG 610 ([place]) 08/08/2017   Lab Results  Component Value Date   CHOLHDL 7.4 (H) 08/08/2017    Hypothyroidism [employer] is a 57 y.o. male who presents for follow up of hypothyroidism. Current symptoms: change in energy level and weight changes Patient denies diarrhea, heat / cold intolerance, nervousness and palpitations. Symptoms have stabilized. He was instructed to return to the office 2 months ago for repeat testing of TSH. However today is the first time he has returned since May. Lab Results  Component Value Date   TSH 0.030 (L) 01/29/2018     Shoulder Pain: Patient complaints of left shoulder pain. The pain is described as aching, sharp and throbbing.  The onset of the pain was several years ago.  The pain occurs several times per day and when active. Location is anterior, global, acromioclavicular joint. No history of dislocation. Symptoms are aggravated by reaching, lifting, pulling, pushing. Symptoms are diminished by  rest.   Limited activities include: lifting, pulling, pushing. no weakness, no swelling is reported.    Review of Systems  Constitutional: Negative for fever, malaise/fatigue and weight loss.  HENT: Negative.  Negative for nosebleeds.   Eyes: Negative.  Negative for blurred vision, double vision and photophobia.  Respiratory: Negative.  Negative for cough and shortness of breath.   Cardiovascular: Negative.  Negative for chest pain, palpitations and leg swelling.  Gastrointestinal: Negative.  Negative for heartburn, nausea and vomiting.  Musculoskeletal: Positive for joint pain. Negative for myalgias.       SEE HPI  Neurological: Negative.  Negative for dizziness, focal weakness, seizures and headaches.  Psychiatric/Behavioral: Negative  for suicidal ideas. The patient is nervous/anxious and has insomnia.     Past Medical History:  Diagnosis Date  . Arthritis   . Diabetes mellitus without complication ([place])   . Hyperlipidemia   . Thyroid neoplasm   . Visual disorder    LEFT EYE    Past Surgical History:  Procedure Laterality Date  . SHOULDER ARTHROSCOPY    . THYROIDECTOMY N/A 08/13/2015   Procedure: TOTAL THYROIDECTOMY;  Surgeon: Armandina Gemma, MD;  Location: [facility];  Service: General;  Laterality: N/A;    Family History  Problem Relation Age of Onset  . Diabetes Mother   . Hypertension Mother   . Hypertension Father   . Diabetes Father     Social History Reviewed with no changes to be made today.   Outpatient Medications Prior to Visit  Medication Sig Dispense Refill  . albuterol (PROVENTIL HFA;VENTOLIN HFA) 108 (90 Base) MCG/ACT inhaler Inhale 2 puffs into the lungs every 6 (six) hours as needed for wheezing or shortness  of breath. 1 Inhaler 2  . glucose blood (ACCU-CHEK AVIVA PLUS) test strip USE AS INSTRUCTED 100 each 12  . levothyroxine (SYNTHROID, LEVOTHROID) 200 MCG tablet Take 1 tablet (200 mcg total) by mouth daily. 30 tablet 3  . ACCU-CHEK SOFTCLIX LANCETS lancets   0  . ACCU-CHEK SOFTCLIX LANCETS lancets USE AS INSTRUCTED 100 each 12  . atorvastatin (LIPITOR) 40 MG tablet Take 1 tablet (40 mg total) by mouth daily. 90 tablet 3  . fenofibrate (TRICOR) 145 MG tablet Take 1 tablet (145 mg total) by mouth daily. 90 tablet 3  . glipiZIDE (GLUCOTROL XL) 10 MG 24 hr tablet Take 1 tablet (10 mg total) by mouth daily with breakfast. 90 tablet 3  . metFORMIN (GLUCOPHAGE) 1000 MG tablet Take 1 tablet (1,000 mg total) by mouth 2 (two) times daily with a meal. 180 tablet 3  . aspirin 81 MG tablet Take 81 mg by mouth daily.    . NONFORMULARY OR COMPOUNDED ITEM Apply 1-2 g topically 4 (four) times daily. Bupivacaine 1 %/ Doxepin 3%/ Gabapentin 6%/ Pentoxifyline 3%/ Topiramate 1% (Patient not taking: Reported on  01/29/2018) 120 each 2  . acetaminophen-codeine (TYLENOL #3) 300-30 MG tablet Take 1 tablet every 4 (four) hours as needed by mouth. (Patient not taking: Reported on 05/06/2018) 60 tablet 1  . fluocinonide (LIDEX) 0.05 % external solution Apply 1 application topically 2 (two) times daily. (Patient not taking: Reported on 05/06/2018) 60 mL 0  . gabapentin (NEURONTIN) 300 MG capsule Take 2 capsules (600 mg total) by mouth 3 (three) times daily. (Patient not taking: Reported on 05/06/2018) 270 capsule 3   No facility-administered medications prior to visit.     No Known Allergies     Objective:    BP 120/80 (BP Location: Right Arm, Patient Position: Sitting, Cuff Size: Large)   Pulse 76   Temp 98.3 F (36.8 C) (Oral)   Ht 5\' 10"  (1.778 m)   Wt 224 lb 6.4 oz (101.8 kg)   SpO2 96%   BMI 32.20 kg/m  Wt Readings from Last 3 Encounters:  05/06/18 224 lb 6.4 oz (101.8 kg)  01/29/18 238 lb (108 kg)  11/14/17 229 lb (103.9 kg)    Physical Exam  Constitutional: He is oriented to person, place, and time. He appears well-developed and well-nourished. He is cooperative.  HENT:  Head: Normocephalic and atraumatic.  Eyes: EOM are normal.  Neck: Normal range of motion.  Cardiovascular: Normal rate, regular rhythm and normal heart sounds. Exam reveals no gallop and no friction rub.  No murmur heard. Pulmonary/Chest: Effort normal and breath sounds normal. No tachypnea. No respiratory distress. He has no decreased breath sounds. He has no wheezes. He has no rhonchi. He has no rales. He exhibits no tenderness.  Abdominal: Bowel sounds are normal.  Musculoskeletal: Normal range of motion. He exhibits tenderness. He exhibits no edema.       Left shoulder: He exhibits pain. He exhibits normal range of motion, no bony tenderness, no swelling, no deformity and normal strength. Tenderness: acromioclavicular joint.  Neurological: He is alert and oriented to person, place, and time. Coordination normal.    Skin: Skin is warm and dry.  Psychiatric: He has a normal mood and affect. His behavior is normal. Judgment and thought content normal.  Nursing note and vitals reviewed.        Patient has been counseled extensively about nutrition and exercise as well as the importance of adherence with medications and regular follow-up. The patient  was given clear instructions to go to ER or return to medical center if symptoms don't improve, worsen or new problems develop. The patient verbalized understanding.   Follow-up: Return for NEEDS Physical and EKG .   Gildardo Pounds, FNP-BC [facility] and [place], [place]   05/06/2018, 1:19 PM

## 2018-05-07 LAB — TSH: TSH: 0.377 u[IU]/mL — ABNORMAL LOW (ref 0.450–4.500)

## 2018-05-07 LAB — MICROALBUMIN / CREATININE URINE RATIO
CREATININE, UR: 199.8 mg/dL
Microalb/Creat Ratio: 27.6 mg/g creat (ref 0.0–30.0)
Microalbumin, Urine: 55.2 ug/mL

## 2018-05-07 LAB — T3: T3, Total: 108 ng/dL (ref 71–180)

## 2018-05-07 LAB — LIPID PANEL
CHOL/HDL RATIO: 5.3 ratio — AB (ref 0.0–5.0)
Cholesterol, Total: 192 mg/dL (ref 100–199)
HDL: 36 mg/dL — AB (ref >39)
LDL CALC: 98 mg/dL (ref 0–99)
Triglycerides: 289 mg/dL — ABNORMAL HIGH (ref 0–149)
VLDL CHOLESTEROL CAL: 58 mg/dL — AB (ref 5–40)

## 2018-05-07 LAB — T4, FREE: Free T4: 1.77 ng/dL (ref 0.82–1.77)

## 2018-05-07 LAB — HEPATITIS C ANTIBODY: HEP C VIRUS AB: 0.1 {s_co_ratio} (ref 0.0–0.9)

## 2018-05-09 ENCOUNTER — Ambulatory Visit (INDEPENDENT_AMBULATORY_CARE_PROVIDER_SITE_OTHER): Payer: Medicaid Other | Admitting: Otolaryngology

## 2018-05-09 ENCOUNTER — Other Ambulatory Visit: Payer: Self-pay | Admitting: Nurse Practitioner

## 2018-05-09 ENCOUNTER — Telehealth: Payer: Self-pay

## 2018-05-09 DIAGNOSIS — H903 Sensorineural hearing loss, bilateral: Secondary | ICD-10-CM

## 2018-05-09 DIAGNOSIS — E0590 Thyrotoxicosis, unspecified without thyrotoxic crisis or storm: Secondary | ICD-10-CM

## 2018-05-09 NOTE — Telephone Encounter (Signed)
-----   Message from Gildardo Pounds, NP sent at 05/09/2018  2:05 AM EDT ----- Thyroid levels are still abnormal. Will refer you to endocrinology for further evaluation. Also your cholesterol levels are still elevated. Please make sure you are taking your 2 cholesterol medications as prescribed.

## 2018-05-09 NOTE — Telephone Encounter (Signed)
CMA attempt to call patient to inform on lab results.  No answer and left a VM for patient to call back.  If patient call back, please inform:  Thyroid levels are still abnormal. Will refer you to endocrinology for further evaluation. Also your cholesterol levels are still elevated. Please make sure you are taking your 2 cholesterol medications as prescribed.  A letter will be send out to patient.

## 2018-05-10 ENCOUNTER — Telehealth: Payer: Self-pay | Admitting: *Deleted

## 2018-05-10 NOTE — Telephone Encounter (Signed)
MA used Ronnie through [place] PJ#[id number] to explain in person results to patient.

## 2018-05-10 NOTE — Telephone Encounter (Signed)
Patient received paperwork for son who's provider is [employer]. Patient is aware of his PCP being Raul Del and last visit being 05/06/18. Patient was advised by PCP that it would be 10 business days for paperwork.  Patient is aware of being referred to endocrinology due to TSH not being in range. Patient reminded of needing to take both cholesterol medications to address consistently high levels.

## 2018-05-14 ENCOUNTER — Telehealth: Payer: Self-pay | Admitting: Nurse Practitioner

## 2018-05-14 NOTE — Telephone Encounter (Signed)
Pt came in to request an update on his paperwork he dropped off, please follow up when ready for pickup

## 2018-05-14 NOTE — Telephone Encounter (Signed)
Will route to PCP 

## 2018-05-23 ENCOUNTER — Ambulatory Visit: Payer: Medicaid Other | Admitting: Podiatry

## 2018-05-23 NOTE — Telephone Encounter (Signed)
Paperwork has been completed

## 2018-06-11 ENCOUNTER — Ambulatory Visit: Payer: Medicaid Other | Attending: Nurse Practitioner | Admitting: Nurse Practitioner

## 2018-06-11 ENCOUNTER — Encounter: Payer: Self-pay | Admitting: Nurse Practitioner

## 2018-06-11 VITALS — BP 122/84 | HR 72 | Temp 98.2°F | Ht 70.0 in | Wt 224.0 lb

## 2018-06-11 DIAGNOSIS — Z79890 Hormone replacement therapy (postmenopausal): Secondary | ICD-10-CM | POA: Insufficient documentation

## 2018-06-11 DIAGNOSIS — Z79899 Other long term (current) drug therapy: Secondary | ICD-10-CM | POA: Insufficient documentation

## 2018-06-11 DIAGNOSIS — Z833 Family history of diabetes mellitus: Secondary | ICD-10-CM | POA: Diagnosis not present

## 2018-06-11 DIAGNOSIS — R413 Other amnesia: Secondary | ICD-10-CM | POA: Diagnosis not present

## 2018-06-11 DIAGNOSIS — Z23 Encounter for immunization: Secondary | ICD-10-CM | POA: Insufficient documentation

## 2018-06-11 DIAGNOSIS — E1142 Type 2 diabetes mellitus with diabetic polyneuropathy: Secondary | ICD-10-CM | POA: Diagnosis not present

## 2018-06-11 DIAGNOSIS — E039 Hypothyroidism, unspecified: Secondary | ICD-10-CM | POA: Diagnosis not present

## 2018-06-11 DIAGNOSIS — Z0000 Encounter for general adult medical examination without abnormal findings: Secondary | ICD-10-CM | POA: Diagnosis not present

## 2018-06-11 DIAGNOSIS — Z7982 Long term (current) use of aspirin: Secondary | ICD-10-CM | POA: Insufficient documentation

## 2018-06-11 DIAGNOSIS — Z7984 Long term (current) use of oral hypoglycemic drugs: Secondary | ICD-10-CM | POA: Diagnosis not present

## 2018-06-11 DIAGNOSIS — E785 Hyperlipidemia, unspecified: Secondary | ICD-10-CM | POA: Insufficient documentation

## 2018-06-11 LAB — GLUCOSE, POCT (MANUAL RESULT ENTRY): POC GLUCOSE: 127 mg/dL — AB (ref 70–99)

## 2018-06-11 MED ORDER — DULOXETINE HCL 30 MG PO CPEP: 30.0000 mg | ORAL_CAPSULE | Freq: Every day | ORAL | 1 refills | Status: DC
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
## 2018-06-12 LAB — TSH: TSH: 0.011 u[IU]/mL — ABNORMAL LOW (ref 0.450–4.500)

## 2018-06-14 ENCOUNTER — Other Ambulatory Visit: Payer: Self-pay | Admitting: Nurse Practitioner

## 2018-06-14 DIAGNOSIS — E039 Hypothyroidism, unspecified: Secondary | ICD-10-CM

## 2018-06-14 MED ORDER — LEVOTHYROXINE SODIUM 150 MCG PO TABS: 150.0000 ug | ORAL_TABLET | Freq: Every day | ORAL | 1 refills | Status: DC

## 2018-06-17 ENCOUNTER — Ambulatory Visit: Payer: Medicaid Other | Admitting: Diagnostic Neuroimaging

## 2018-06-17 ENCOUNTER — Encounter: Payer: Self-pay | Admitting: Diagnostic Neuroimaging

## 2018-06-17 ENCOUNTER — Telehealth: Payer: Self-pay | Admitting: Diagnostic Neuroimaging

## 2018-06-17 ENCOUNTER — Telehealth: Payer: Self-pay

## 2018-06-17 VITALS — BP 128/75 | HR 78 | Ht 70.0 in | Wt 224.8 lb

## 2018-06-17 DIAGNOSIS — R413 Other amnesia: Secondary | ICD-10-CM | POA: Diagnosis not present

## 2018-06-17 DIAGNOSIS — F419 Anxiety disorder, unspecified: Secondary | ICD-10-CM | POA: Diagnosis not present

## 2018-06-17 NOTE — Progress Notes (Signed)
[facility]  PATIENT: Joe Wallace DOB: 1961-03-19  REFERRING CLINICIAN: Lacey Jensen HISTORY FROM: patient, son (via Optometrist) REASON FOR VISIT: new consult    HISTORICAL  CHIEF COMPLAINT:  Chief Complaint  Patient presents with  . New Patient (Initial Visit)    Rm 7,   . Memory Loss    Referral by  [name], NP    HISTORY OF PRESENT ILLNESS:   57 year old male here for evaluation of memory loss.  For past 1 year patient has had gradual onset progressive short-term memory loss, anxiety, anger outburst.  He is forgetting how to take his medications and forgetting appointments.  Patient moved to the [place] from [place] around 5 years ago, was living in [place], renting several rooms for him in his family.  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  About 1.5 years ago he found a new home for him at his family in [place], but he does not like this change and feels that his new home is like a "jail" and he does not like this.  Then about 1 year ago he has been having more anxiety, short-term memory loss, stress.  His wife is also sick and he worries about her.  Patient also diagnosed with hearing loss and was prescribed hearing aids, but he is not able to afford these.    REVIEW OF SYSTEMS: Full 14 system review of systems performed and negative with exception of: Memory loss dizziness diabetes hypercholesterolemia headaches depression anxiety.  ALLERGIES: No Known Allergies  HOME MEDICATIONS: Outpatient Medications Prior to Visit  Medication Sig Dispense Refill  . ACCU-CHEK SOFTCLIX LANCETS lancets USE AS INSTRUCTED. Check blood glucose at home by fingerstick 2 (two) times per day. 100 each 12  . albuterol (PROVENTIL HFA;VENTOLIN HFA) 108 (90 Base) MCG/ACT inhaler Inhale 2 puffs into the lungs every 6 (six) hours as needed for wheezing  or shortness of breath. 1 Inhaler 2  . aspirin 81 MG tablet Take 81 mg by mouth daily.    Marland Kitchen atorvastatin (LIPITOR) 40 MG tablet Take 1 tablet (40 mg total) by mouth daily. 90 tablet 3  . DULoxetine (CYMBALTA) 30 MG capsule Take 1 capsule (30 mg total) by mouth daily. 30 capsule 1  . fenofibrate (TRICOR) 145 MG tablet Take 1 tablet (145 mg total) by mouth daily. 90 tablet 3  . glucose blood (ACCU-CHEK AVIVA PLUS) test strip USE AS INSTRUCTED 100 each 12  . ibuprofen (ADVIL,MOTRIN) 800 MG tablet Take 1 tablet (800 mg total) by mouth every 8 (eight) hours as needed. 60 tablet 0  . levothyroxine (SYNTHROID, LEVOTHROID) 150 MCG tablet Take 1 tablet (150 mcg total) by mouth daily. 30 tablet 1  . metFORMIN (GLUCOPHAGE) 1000 MG tablet Take 1 tablet (1,000 mg total) by mouth 2 (two) times daily with a meal. 180 tablet 3  . NONFORMULARY OR COMPOUNDED ITEM Apply 1-2 g topically 4 (four) times daily. Bupivacaine 1 %/ Doxepin 3%/ Gabapentin 6%/ Pentoxifyline 3%/ Topiramate 1% 120 each 2  . glipiZIDE (GLUCOTROL XL) 10 MG 24 hr tablet Take 2 tablets (20 mg total) by mouth daily with breakfast. 180 tablet 3   No facility-administered medications prior to visit.     PAST MEDICAL HISTORY: Past Medical History:  Diagnosis Date  . Arthritis   . Diabetes mellitus without complication ([facility] [place])   . Hyperlipidemia   .  Thyroid neoplasm   . Visual disorder    LEFT EYE    PAST SURGICAL HISTORY: Past Surgical History:  Procedure Laterality Date  . SHOULDER ARTHROSCOPY    . THYROIDECTOMY N/A 08/13/2015   Procedure: TOTAL THYROIDECTOMY;  Surgeon: Armandina Gemma, MD;  Location: [facility];  Service: General;  Laterality: N/A;    FAMILY HISTORY: Family History  Problem Relation Age of Onset  . Diabetes Mother   . Hypertension Mother   . Hypertension Father   . Diabetes Father     SOCIAL HISTORY: Social History   Socioeconomic History  . Marital status: Married    Spouse name: Not on file  . Number of  children: Not on file  . Years of education: Not on file  . Highest education level: Not on file  Occupational History  . Not on file  Social Needs  . Financial resource strain: Not on file  . Food insecurity:    Worry: Not on file    Inability: Not on file  . Transportation needs:    Medical: Not on file    Non-medical: Not on file  Tobacco Use  . Smoking status: Never Smoker  . Smokeless tobacco: Never Used  Substance and Sexual Activity  . Alcohol use: No  . Drug use: No  . Sexual activity: Yes  Lifestyle  . Physical activity:    Days per week: Not on file    Minutes per session: Not on file  . Stress: Not on file  Relationships  . Social connections:    Talks on phone: Not on file    Gets together: Not on file    Attends religious service: Not on file    Active member of club or organization: Not on file    Attends meetings of clubs or organizations: Not on file    Relationship status: Not on file  . Intimate partner violence:    Fear of current or ex partner: Not on file    Emotionally abused: Not on file    Physically abused: Not on file    Forced sexual activity: Not on file  Other Topics Concern  . Not on file  Social History Narrative  . Not on file     PHYSICAL EXAM  GENERAL EXAM/CONSTITUTIONAL: Vitals:  Vitals:   06/17/18 1119  BP: 128/75  Pulse: 78  Weight: 224 lb 12.8 oz (102 kg)  Height: 5\' 10"  (1.778 m)     Body mass index is 32.26 kg/m. Wt Readings from Last 3 Encounters:  06/17/18 224 lb 12.8 oz (102 kg)  06/11/18 224 lb (101.6 kg)  05/06/18 224 lb 6.4 oz (101.8 kg)     Patient is in no distress; well developed, nourished and groomed; neck is supple  CARDIOVASCULAR:  Examination of carotid arteries is normal; no carotid bruits  Regular rate and rhythm, no murmurs  Examination of peripheral vascular system by observation and palpation is normal  EYES:  Ophthalmoscopic exam of optic discs and posterior segments is normal;  no papilledema or hemorrhages  No exam data present  MUSCULOSKELETAL:  Gait, strength, tone, movements noted in Neurologic exam below  NEUROLOGIC: MENTAL STATUS:  MMSE - Mini Mental State Exam 06/17/2018  Orientation to time 5  Orientation to Place 4  Registration 3  Attention/ Calculation 5  Recall 0  Language- name 2 objects 2  Language- repeat 1  Language- follow 3 step command 2  Language- read & follow direction 1  Write a sentence 1  Copy design 1  Total score 25    awake, alert, oriented to person, place and time  recent and remote memory intact  normal attention and concentration  language fluent, comprehension intact, naming intact  fund of knowledge appropriate  CRANIAL NERVE:   2nd - no papilledema on fundoscopic exam  2nd, 3rd, 4th, 6th - pupils equal and reactive to light, visual fields full to confrontation, extraocular muscles intact, no nystagmus  5th - facial sensation symmetric  7th - facial strength symmetric  8th - hearing intact  9th - palate elevates symmetrically, uvula midline  11th - shoulder shrug symmetric  12th - tongue protrusion midline  MOTOR:   normal bulk and tone, full strength in the BUE, BLE  SENSORY:   normal and symmetric to light touch, temperature, vibration  COORDINATION:   finger-nose-finger, fine finger movements normal  REFLEXES:   deep tendon reflexes present and symmetric  GAIT/STATION:   narrow based gait    DIAGNOSTIC DATA (LABS, IMAGING, TESTING) - I reviewed patient records, labs, notes, testing and imaging myself where available.  Lab Results  Component Value Date   WBC 6.5 08/09/2015   HGB 12.7 (L) 08/09/2015   HCT 41.5 08/09/2015   MCV 84.5 08/09/2015   PLT 255 08/09/2015      Component Value Date/Time   NA 136 01/29/2018 1221   K 4.7 01/29/2018 1221   CL 99 01/29/2018 1221   CO2 22 01/29/2018 1221   GLUCOSE 268 (H) 01/29/2018 1221   GLUCOSE 219 (H) 06/29/2016 1211   BUN  16 01/29/2018 1221   CREATININE 0.83 01/29/2018 1221   CREATININE 1.24 06/29/2016 1211   CALCIUM 9.1 01/29/2018 1221   PROT 7.2 01/29/2018 1221   ALBUMIN 4.2 01/29/2018 1221   AST 13 01/29/2018 1221   ALT 19 01/29/2018 1221   ALKPHOS 88 01/29/2018 1221   BILITOT 0.2 01/29/2018 1221   GFRNONAA 96 01/29/2018 1221   GFRNONAA 64 06/29/2016 1211   GFRAA 111 01/29/2018 1221   GFRAA 74 06/29/2016 1211   Lab Results  Component Value Date   CHOL 192 05/06/2018   HDL 36 (L) 05/06/2018   LDLCALC 98 05/06/2018   TRIG 289 (H) 05/06/2018   CHOLHDL 5.3 (H) 05/06/2018   Lab Results  Component Value Date   HGBA1C 10.9 (A) 05/06/2018   No results found for: [id number] Lab Results  Component Value Date   TSH 0.011 (L) 06/11/2018         ASSESSMENT AND PLAN  57 y.o. year old male here with 1 year of subjective short-term memory loss, anxiety, stress, in the setting of new living environment, transitioning to disability, and other family stress.  Most likely represents mild cognitive impairment versus stress related memory impairment.  We will proceed with further work-up.  Dx:  1. Anxiety   2. Memory loss     PLAN:  MEMORY LOSS - check CT head (history of metal working and scalp fragment; although skull xray was negative in 2015) - check B12 - treat hypothyroidism and diabetes (per PCP) - safety and supervision issues reviewed - follow up hearing loss / hearing aids  ANXIETY / DEPRESSION - refer to psychology (counseling)  Orders Placed This Encounter  Procedures  . CT HEAD WO CONTRAST  . Vitamin B12  . Ambulatory referral to Psychology   Return pending test results.    Penni Bombard, MD 0/93/2355, 73:22 AM Certified in Neurology, Neurophysiology and Neuroimaging  [facility] [address]  [place], [place] [address] [phone]

## 2018-06-17 NOTE — Telephone Encounter (Signed)
pending faxed clinical notes.

## 2018-06-17 NOTE — Patient Instructions (Signed)
MEMORY LOSS - check CT head - check B12 level - treat hypothyroidism and diabetes (per PCP) - safety and supervision issues reviewed - follow up hearing loss / hearing aids  ANXIETY / DEPRESSION - refer to psychology (counseling)

## 2018-06-17 NOTE — Telephone Encounter (Signed)
-----   Message from Gildardo Pounds, NP sent at 06/14/2018 12:17 AM EDT ----- Thyroid levels are low. I will decrease your thyroid medication at this time. Please make a lab appointment for 4 weeks to recheck your thyroid level. Make sure you take your thyroid medication first thing in the morning by itself. It should not be taken with any other medications for at least 30 minutes.

## 2018-06-17 NOTE — Telephone Encounter (Signed)
CMA attempt to reach patient to inform on results.  No answer and left a VM for patient to call back.  If patient call back, please inform:  Thyroid levels are low. I will decrease your thyroid medication at this time. Please make a lab appointment for 4 weeks to recheck your thyroid level. Make sure you take your thyroid medication first thing in the morning by itself. It should not be taken with any other medications for at least 30 minutes.   A letter will be send out to reach patient.

## 2018-06-18 ENCOUNTER — Telehealth: Payer: Self-pay | Admitting: Nurse Practitioner

## 2018-06-18 ENCOUNTER — Ambulatory Visit (HOSPITAL_BASED_OUTPATIENT_CLINIC_OR_DEPARTMENT_OTHER)
Admission: RE | Admit: 2018-06-18 | Discharge: 2018-06-18 | Disposition: A | Discharge status: Home / Self Care | Payer: Medicaid Other | Source: Ambulatory Visit | Attending: Diagnostic Neuroimaging | Admitting: Diagnostic Neuroimaging

## 2018-06-18 DIAGNOSIS — R413 Other amnesia: Secondary | ICD-10-CM | POA: Insufficient documentation

## 2018-06-18 LAB — VITAMIN B12: Vitamin B-12: 232 pg/mL (ref 232–1245)

## 2018-06-18 NOTE — Telephone Encounter (Signed)
Patient is scheduled at [facility] for 06/18/18.

## 2018-06-18 NOTE — Telephone Encounter (Signed)
medicaid Josem Kaufmann: [id number] (exp. 06/17/18 to 07/17/18) they will contact the pt to schedule

## 2018-06-18 NOTE — Telephone Encounter (Signed)
Patient was inform by Receptionist that he is currently taking his Thyroid medication that he picked up on 09.20.19.

## 2018-06-18 NOTE — Telephone Encounter (Signed)
Patient uses [facility]. Please follow up with patient on his thyroid medication.

## 2018-06-19 ENCOUNTER — Encounter: Payer: Self-pay | Admitting: *Deleted

## 2018-06-19 ENCOUNTER — Other Ambulatory Visit: Payer: Self-pay | Admitting: *Deleted

## 2018-06-19 ENCOUNTER — Telehealth: Payer: Self-pay | Admitting: *Deleted

## 2018-06-19 DIAGNOSIS — R413 Other amnesia: Secondary | ICD-10-CM

## 2018-06-19 DIAGNOSIS — F419 Anxiety disorder, unspecified: Secondary | ICD-10-CM

## 2018-06-19 NOTE — Telephone Encounter (Signed)
-----   Message from Penni Bombard, MD sent at 06/18/2018 10:38 AM EDT ----- B12 is borderline low. Start Vitamin B12 replacement 10103mcg daily. Then follow up with PCP. Please call patient. -VRP

## 2018-06-19 NOTE — Telephone Encounter (Signed)
Spoke to pt thru arabic interpreter [phone] Clydell Hakim ([facility] [employer]) [phone].  Relayed that his labs show his B12 level was borderline low, start OTC vitamin B12 supplement 1050mcg daily.  F/U with pcp.  His CT scan of head was unremarkable/normal.  I would consult Dr. Leta Baptist again relating to metal fragment, and he stated: No sign of and metal fragment.  Pt wanted results mailed to him.  I relayed that will mail to his address.

## 2018-06-19 NOTE — Telephone Encounter (Signed)
Mailed both CT and lab results to pt.

## 2018-06-21 ENCOUNTER — Ambulatory Visit (HOSPITAL_COMMUNITY)
Admission: RE | Admit: 2018-06-21 | Discharge: 2018-06-21 | Disposition: A | Discharge status: Home / Self Care | Payer: Medicaid Other | Source: Ambulatory Visit | Attending: Nurse Practitioner | Admitting: Nurse Practitioner

## 2018-06-21 DIAGNOSIS — M25512 Pain in left shoulder: Secondary | ICD-10-CM | POA: Diagnosis not present

## 2018-06-21 DIAGNOSIS — G8929 Other chronic pain: Secondary | ICD-10-CM

## 2018-06-21 DIAGNOSIS — M19012 Primary osteoarthritis, left shoulder: Secondary | ICD-10-CM | POA: Diagnosis not present

## 2018-06-23 ENCOUNTER — Other Ambulatory Visit: Payer: Self-pay | Admitting: Nurse Practitioner

## 2018-06-23 MED ORDER — DICLOFENAC SODIUM 1 % TD GEL: 2.0000 g | Freq: Four times a day (QID) | TRANSDERMAL | 1 refills | Status: AC

## 2018-06-26 ENCOUNTER — Telehealth: Payer: Self-pay

## 2018-06-26 NOTE — Telephone Encounter (Signed)
CMA spoke to patient to inform on results.  Patient verified DOB. Patient understood.  [place] interpreter Dandi [phone] assist with the call.  Patient is aware PCP sent a Gel for his shoulder to the pharmacy.

## 2018-06-26 NOTE — Telephone Encounter (Signed)
-----   Message from Gildardo Pounds, NP sent at 06/23/2018  7:18 PM EDT ----- Left shoulder xray was negative. I will send a gel to the pharmacy for you to apply for shoulder pain.

## 2018-07-03 ENCOUNTER — Encounter: Payer: Self-pay | Admitting: Diagnostic Neuroimaging

## 2018-07-03 ENCOUNTER — Ambulatory Visit: Payer: Medicaid Other | Admitting: Diagnostic Neuroimaging

## 2018-07-03 VITALS — BP 128/82 | HR 73 | Ht 70.0 in | Wt 222.4 lb

## 2018-07-03 DIAGNOSIS — G4700 Insomnia, unspecified: Secondary | ICD-10-CM | POA: Diagnosis not present

## 2018-07-03 DIAGNOSIS — F419 Anxiety disorder, unspecified: Secondary | ICD-10-CM | POA: Diagnosis not present

## 2018-07-03 DIAGNOSIS — R413 Other amnesia: Secondary | ICD-10-CM | POA: Diagnosis not present

## 2018-07-03 NOTE — Patient Instructions (Signed)
-   start vitamin B12 1062mcg daily; follow up with PCP  - follow up thyroid level

## 2018-07-03 NOTE — Progress Notes (Signed)
[facility]  PATIENT: Joe Wallace DOB: [birth year]  REFERRING CLINICIAN: Lacey Jensen HISTORY FROM: patient, son (via Optometrist) REASON FOR VISIT: follow up     HISTORICAL  CHIEF COMPLAINT:  Chief Complaint  Patient presents with  . Follow-up    Room 7. Patient reports that after he had the MRI done he had severe pain in his head, he is not sure how long it lasted.   Marland Kitchen Headache    Patient wants to discuss the MRI, his headaches, and says that he's still forgetting things. MMSE on 06/19/18 was 25/30.    HISTORY OF PRESENT ILLNESS:   UPDATE (07/03/18, VRP): Since last visit, doing worse. Had CT head which was unremarkable. Symptoms are persistent. Severity is moderate. No alleviating or aggravating factors. Not able to sleep. More stress and anxiety. Patient is applying for [place] citizenship.   PRIOR HPI (06/17/18): 57 year old male here for evaluation of memory loss.  For past 1 year patient has had gradual onset progressive short-term memory loss, anxiety, anger outburst.  He is forgetting how to take his medications and forgetting appointments.  Patient moved to the [place] from [place] around 5 years ago, was living in [place], renting several rooms for him in his family.  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  About 1.5 years ago he found a new home for him at his family in [place], but he does not like this change and feels that his new home is like a "jail" and he does not like this.  Then about 1 year ago he has been having more anxiety, short-term memory loss, stress.  His wife is also sick and he worries about her.  Patient also diagnosed with hearing loss and was prescribed hearing aids, but he is not able to afford these.    REVIEW OF SYSTEMS: Full 14 system review of systems performed and negative with exception of: memory loss dizziness  fatigue fever hearing loss snoring.   ALLERGIES: No Known Allergies  HOME MEDICATIONS: Outpatient Medications Prior to Visit  Medication Sig Dispense Refill  . ACCU-CHEK SOFTCLIX LANCETS lancets USE AS INSTRUCTED. Check blood glucose at home by fingerstick 2 (two) times per day. 100 each 12  . acetaminophen (TYLENOL) 500 MG tablet Take 500 mg by mouth every 6 (six) hours as needed.    Marland Kitchen albuterol (PROVENTIL HFA;VENTOLIN HFA) 108 (90 Base) MCG/ACT inhaler Inhale 2 puffs into the lungs every 6 (six) hours as needed for wheezing or shortness of breath. 1 Inhaler 2  . aspirin 81 MG tablet Take 81 mg by mouth daily.    Marland Kitchen atorvastatin (LIPITOR) 40 MG tablet Take 1 tablet (40 mg total) by mouth daily. 90 tablet 3  . diclofenac sodium (VOLTAREN) 1 % GEL Apply 2 g topically 4 (four) times daily. 100 g 1  . DULoxetine (CYMBALTA) 30 MG capsule Take 1 capsule (30 mg total) by mouth daily. 30 capsule 1  . fenofibrate (TRICOR) 145 MG tablet Take 1 tablet (145 mg total) by mouth daily. 90 tablet 3  . gabapentin (NEURONTIN) 300 MG capsule Take 600 mg by mouth 2 (two) times daily.    Marland Kitchen glucose blood (ACCU-CHEK AVIVA PLUS) test strip USE AS INSTRUCTED 100 each 12  . ibuprofen (ADVIL,MOTRIN) 800 MG tablet Take 1 tablet (800 mg total) by mouth every 8 (eight) hours as needed. 60 tablet  0  . levothyroxine (SYNTHROID, LEVOTHROID) 200 MCG tablet Take 200 mcg by mouth daily before breakfast.    . metFORMIN (GLUCOPHAGE) 1000 MG tablet Take 1 tablet (1,000 mg total) by mouth 2 (two) times daily with a meal. 180 tablet 3  . NONFORMULARY OR COMPOUNDED ITEM Apply 1-2 g topically 4 (four) times daily. Bupivacaine 1 %/ Doxepin 3%/ Gabapentin 6%/ Pentoxifyline 3%/ Topiramate 1% 120 each 2  . glipiZIDE (GLUCOTROL XL) 10 MG 24 hr tablet Take 2 tablets (20 mg total) by mouth daily with breakfast. 180 tablet 3   No facility-administered medications prior to visit.     PAST MEDICAL HISTORY: Past Medical History:  Diagnosis  Date  . Arthritis   . Depression   . Diabetes mellitus without complication ([place] [place])   . Headache   . Hyperlipidemia   . Memory loss   . Thyroid neoplasm   . Visual disorder    LEFT EYE    PAST SURGICAL HISTORY: Past Surgical History:  Procedure Laterality Date  . head trauma     L sided fragment from war.  Marland Kitchen SHOULDER ARTHROSCOPY    . THYROIDECTOMY N/A 08/13/2015   Procedure: TOTAL THYROIDECTOMY;  Surgeon: Armandina Gemma, MD;  Location: [facility];  Service: General;  Laterality: N/A;    FAMILY HISTORY: Family History  Problem Relation Age of Onset  . Diabetes Mother   . Hypertension Mother   . Hypertension Father   . Diabetes Father     SOCIAL HISTORY: Social History   Socioeconomic History  . Marital status: Married    Spouse name: Not on file  . Number of children: Not on file  . Years of education: Not on file  . Highest education level: Not on file  Occupational History  . Not on file  Social Needs  . Financial resource strain: Not on file  . Food insecurity:    Worry: Not on file    Inability: Not on file  . Transportation needs:    Medical: Not on file    Non-medical: Not on file  Tobacco Use  . Smoking status: Never Smoker  . Smokeless tobacco: Never Used  Substance and Sexual Activity  . Alcohol use: No  . Drug use: No  . Sexual activity: Yes  Lifestyle  . Physical activity:    Days per week: Not on file    Minutes per session: Not on file  . Stress: Not on file  Relationships  . Social connections:    Talks on phone: Not on file    Gets together: Not on file    Attends religious service: Not on file    Active member of club or organization: Not on file    Attends meetings of clubs or organizations: Not on file    Relationship status: Not on file  . Intimate partner violence:    Fear of current or ex partner: Not on file    Emotionally abused: Not on file    Physically abused: Not on file    Forced sexual activity: Not on file  Other Topics  Concern  . Not on file  Social History Narrative   Lives home with wife and 7 boyss, [place].  Does not work, SSI.  Education: middle school.  Soda once daily.      PHYSICAL EXAM  GENERAL EXAM/CONSTITUTIONAL: Vitals:  Vitals:   07/03/18 1421  BP: 128/82  Pulse: 73  Weight: 222 lb 6.4 oz (100.9 kg)  Height:  5\' 10"  (1.778 m)   Body mass index is 31.91 kg/m. Wt Readings from Last 3 Encounters:  07/03/18 222 lb 6.4 oz (100.9 kg)  06/17/18 224 lb 12.8 oz (102 kg)  06/11/18 224 lb (101.6 kg)    Patient is in no distress; well developed, nourished and groomed; neck is supple  DEPRESSED AFFECT  CARDIOVASCULAR:  Examination of carotid arteries is normal; no carotid bruits  Regular rate and rhythm, no murmurs  Examination of peripheral vascular system by observation and palpation is normal  EYES:  Ophthalmoscopic exam of optic discs and posterior segments is normal; no papilledema or hemorrhages No exam data present  MUSCULOSKELETAL:  Gait, strength, tone, movements noted in Neurologic exam below  NEUROLOGIC: MENTAL STATUS:  MMSE - Mini Mental State Exam 06/17/2018  Orientation to time 5  Orientation to Place 4  Registration 3  Attention/ Calculation 5  Recall 0  Language- name 2 objects 2  Language- repeat 1  Language- follow 3 step command 2  Language- read & follow direction 1  Write a sentence 1  Copy design 1  Total score 25    awake, alert, oriented to person, place and time  recent and remote memory intact  normal attention and concentration  language fluent, comprehension intact, naming intact  fund of knowledge appropriate  CRANIAL NERVE:   2nd - no papilledema on fundoscopic exam  2nd, 3rd, 4th, 6th - pupils equal and reactive to light, visual fields full to confrontation, extraocular muscles intact, no nystagmus  5th - facial sensation symmetric  7th - facial strength symmetric  8th - hearing intact  9th -  palate elevates symmetrically, uvula midline  11th - shoulder shrug symmetric  12th - tongue protrusion midline  MOTOR:   normal bulk and tone, full strength in the BUE, BLE  SENSORY:   normal and symmetric to light touch, temperature, vibration  COORDINATION:   finger-nose-finger, fine finger movements normal  REFLEXES:   deep tendon reflexes present and symmetric  GAIT/STATION:   narrow based gait    DIAGNOSTIC DATA (LABS, IMAGING, TESTING) - I reviewed patient records, labs, notes, testing and imaging myself where available.  Lab Results  Component Value Date   WBC 6.5 08/09/2015   HGB 12.7 (L) 08/09/2015   HCT 41.5 08/09/2015   MCV 84.5 08/09/2015   PLT 255 08/09/2015      Component Value Date/Time   NA 136 01/29/2018 1221   K 4.7 01/29/2018 1221   CL 99 01/29/2018 1221   CO2 22 01/29/2018 1221   GLUCOSE 268 (H) 01/29/2018 1221   GLUCOSE 219 (H) 06/29/2016 1211   BUN 16 01/29/2018 1221   CREATININE 0.83 01/29/2018 1221   CREATININE 1.24 06/29/2016 1211   CALCIUM 9.1 01/29/2018 1221   PROT 7.2 01/29/2018 1221   ALBUMIN 4.2 01/29/2018 1221   AST 13 01/29/2018 1221   ALT 19 01/29/2018 1221   ALKPHOS 88 01/29/2018 1221   BILITOT 0.2 01/29/2018 1221   GFRNONAA 96 01/29/2018 1221   GFRNONAA 64 06/29/2016 1211   GFRAA 111 01/29/2018 1221   GFRAA 74 06/29/2016 1211   Lab Results  Component Value Date   CHOL 192 05/06/2018   HDL 36 (L) 05/06/2018   LDLCALC 98 05/06/2018   TRIG 289 (H) 05/06/2018   CHOLHDL 5.3 (H) 05/06/2018   Lab Results  Component Value Date   HGBA1C 10.9 (A) 05/06/2018   Lab Results  Component Value Date   VITAMINB12 232 06/17/2018   Lab  Results  Component Value Date   TSH 0.011 (L) 06/11/2018    12/05/13 xray foreign body eye [I reviewed images myself and agree with interpretation. -VRP]  - negative  06/18/18 CT head [I reviewed images myself and agree with interpretation. -VRP]  - negative     ASSESSMENT AND  PLAN  57 y.o. year old male here with 1 year of subjective short-term memory loss, anxiety, stress, in the setting of new living environment, transitioning to disability, and other family stress.  Most likely represents mild cognitive impairment versus stress related memory impairment.    Dx:  1. Memory loss   2. Anxiety   3. Insomnia, unspecified type       PLAN:  MEMORY LOSS - follow up with psychology - treat hypothyroidism and diabetes (per PCP) - safety and supervision issues reviewed - follow up hearing loss / hearing aids  ANXIETY / DEPRESSION / INSOMNIA - refer to psychology (counseling)  Return if symptoms worsen or fail to improve, for return to PCP.    Penni Bombard, MD 11/28/2479, 8:59 PM Certified in Neurology, Neurophysiology and Neuroimaging  [facility] [address] [place], [place] [address] [phone]

## 2018-07-08 ENCOUNTER — Telehealth: Payer: Self-pay | Admitting: Diagnostic Neuroimaging

## 2018-07-08 NOTE — Telephone Encounter (Signed)
Patient came into the office today req to make an apt for his headaches. Should I just offer him the first available with you, or would he need another referral sent over? He was also just seen on 10/9

## 2018-07-08 NOTE — Telephone Encounter (Signed)
Headaches are related to insomnia, anxiety and depression. He should follow up with PCP and psychiatry. I discussed this with him at last visit. -VRP

## 2018-07-09 NOTE — Telephone Encounter (Signed)
Noted. I will let him know.

## 2018-07-10 ENCOUNTER — Telehealth: Payer: Self-pay | Admitting: Diagnostic Neuroimaging

## 2018-07-10 DIAGNOSIS — R413 Other amnesia: Secondary | ICD-10-CM

## 2018-07-10 DIAGNOSIS — F419 Anxiety disorder, unspecified: Secondary | ICD-10-CM

## 2018-07-10 NOTE — Addendum Note (Signed)
Addended by: Minna Antis on: 07/10/2018 09:41 AM   Modules accepted: Orders

## 2018-07-10 NOTE — Telephone Encounter (Signed)
Sent via [place] -- [phone] -- please speak to  Karle Barr [phone]    to schedule . Thanks Hinton Dyer  [phone]

## 2018-07-10 NOTE — Telephone Encounter (Addendum)
Called and left message asking if he went to psychology apt. Patient's dad still has not went to the apt. Please call son to schedule. I called him to do a follow up call .   Mary clare I called to check on patient. Can you place another referral for psychology because [facility] Mussel [facility] can take him psychology . Thanks Hinton Dyer

## 2018-07-10 NOTE — Telephone Encounter (Signed)
Referral placed to psychology, Dr Jefm Miles, [place]. Re: memory loss, anxiety.

## 2018-07-16 ENCOUNTER — Telehealth: Payer: Self-pay | Admitting: *Deleted

## 2018-07-16 NOTE — Telephone Encounter (Signed)
Pt homeland security form on [facility] desk.

## 2018-07-16 NOTE — Telephone Encounter (Addendum)
Will discuss with Dr Leta Baptist the papers received from [employer] re: appropriate to fill out.  Per Dr Leta Baptist, PCP is appropriate provider to fill out these forms. Called his son, Joe Wallace on [place] and advised him. He requested the papers be mailed to his father, verified address on  EMR. Advised his son to take papers to PCP, Joe Wallace to complete then send back to dept of homeland security or whatever office the patient got papers. Joe Wallace verbalized understanding, appreciation , stated he would let his father know.  All papers placed in large envelop and taken to front office for mailing.

## 2018-07-21 ENCOUNTER — Other Ambulatory Visit: Payer: Self-pay | Admitting: Internal Medicine

## 2018-07-21 DIAGNOSIS — E890 Postprocedural hypothyroidism: Secondary | ICD-10-CM

## 2018-08-07 ENCOUNTER — Ambulatory Visit: Payer: Medicaid Other | Attending: Nurse Practitioner | Admitting: Nurse Practitioner

## 2018-08-07 ENCOUNTER — Encounter: Payer: Self-pay | Admitting: Nurse Practitioner

## 2018-08-07 VITALS — BP 129/83 | HR 73 | Temp 98.6°F | Resp 16 | Wt 226.2 lb

## 2018-08-07 DIAGNOSIS — E890 Postprocedural hypothyroidism: Secondary | ICD-10-CM | POA: Diagnosis not present

## 2018-08-07 DIAGNOSIS — E1142 Type 2 diabetes mellitus with diabetic polyneuropathy: Secondary | ICD-10-CM | POA: Insufficient documentation

## 2018-08-07 DIAGNOSIS — Z79890 Hormone replacement therapy (postmenopausal): Secondary | ICD-10-CM | POA: Diagnosis not present

## 2018-08-07 DIAGNOSIS — Z8249 Family history of ischemic heart disease and other diseases of the circulatory system: Secondary | ICD-10-CM | POA: Diagnosis not present

## 2018-08-07 DIAGNOSIS — E039 Hypothyroidism, unspecified: Secondary | ICD-10-CM | POA: Diagnosis not present

## 2018-08-07 DIAGNOSIS — E1169 Type 2 diabetes mellitus with other specified complication: Secondary | ICD-10-CM | POA: Diagnosis not present

## 2018-08-07 DIAGNOSIS — R413 Other amnesia: Secondary | ICD-10-CM | POA: Insufficient documentation

## 2018-08-07 DIAGNOSIS — G8929 Other chronic pain: Secondary | ICD-10-CM | POA: Insufficient documentation

## 2018-08-07 DIAGNOSIS — Z79899 Other long term (current) drug therapy: Secondary | ICD-10-CM | POA: Diagnosis not present

## 2018-08-07 DIAGNOSIS — M25512 Pain in left shoulder: Secondary | ICD-10-CM | POA: Insufficient documentation

## 2018-08-07 DIAGNOSIS — E782 Mixed hyperlipidemia: Secondary | ICD-10-CM | POA: Diagnosis not present

## 2018-08-07 DIAGNOSIS — Z7982 Long term (current) use of aspirin: Secondary | ICD-10-CM | POA: Insufficient documentation

## 2018-08-07 DIAGNOSIS — F329 Major depressive disorder, single episode, unspecified: Secondary | ICD-10-CM | POA: Diagnosis not present

## 2018-08-07 DIAGNOSIS — Z7984 Long term (current) use of oral hypoglycemic drugs: Secondary | ICD-10-CM | POA: Diagnosis not present

## 2018-08-07 DIAGNOSIS — M1990 Unspecified osteoarthritis, unspecified site: Secondary | ICD-10-CM | POA: Diagnosis not present

## 2018-08-07 DIAGNOSIS — Z833 Family history of diabetes mellitus: Secondary | ICD-10-CM | POA: Diagnosis not present

## 2018-08-07 LAB — POCT GLYCOSYLATED HEMOGLOBIN (HGB A1C): HbA1c, POC (controlled diabetic range): 8.5 % — AB (ref 0.0–7.0)

## 2018-08-07 LAB — GLUCOSE, POCT (MANUAL RESULT ENTRY): POC GLUCOSE: 111 mg/dL — AB (ref 70–99)

## 2018-08-07 MED ORDER — ATORVASTATIN CALCIUM 40 MG PO TABS: 40.0000 mg | ORAL_TABLET | Freq: Every day | ORAL | 3 refills | Status: DC

## 2018-08-07 MED ORDER — METFORMIN HCL 1000 MG PO TABS: 1000.0000 mg | ORAL_TABLET | Freq: Two times a day (BID) | ORAL | 3 refills | Status: DC

## 2018-08-07 MED ORDER — DULOXETINE HCL 30 MG PO CPEP: 30.0000 mg | ORAL_CAPSULE | Freq: Every day | ORAL | 1 refills | Status: DC

## 2018-08-07 MED ORDER — IBUPROFEN 800 MG PO TABS: 800.0000 mg | ORAL_TABLET | Freq: Three times a day (TID) | ORAL | 2 refills | Status: AC | PRN

## 2018-08-07 NOTE — Progress Notes (Signed)
Assessment & Plan:  Joe Wallace was seen today for diabetes.  Diagnoses and all orders for this visit:  Type 2 diabetes mellitus with diabetic polyneuropathy, without long-term current use of insulin (HCC) -     DULoxetine (CYMBALTA) 30 MG capsule; Take 1 capsule (30 mg total) by mouth daily.  Mixed hypertriglyceridemia -     atorvastatin (LIPITOR) 40 MG tablet; Take 1 tablet (40 mg total) by mouth daily. INSTRUCTIONS: Work on a low fat, heart healthy diet and participate in regular aerobic exercise program by working out at least 150 minutes per week; 5 days a week-30 minutes per day. Avoid red meat, fried foods. junk foods, sodas, sugary drinks, unhealthy snacking, alcohol and smoking.  Drink at least 48oz of water per day and monitor your carbohydrate intake daily.    Type 2 diabetes mellitus with other specified complication, without long-term current use of insulin (HCC) -     POCT glucose (manual entry) -     POCT glycosylated hemoglobin (Hb A1C) -     metFORMIN (GLUCOPHAGE) 1000 MG tablet; Take 1 tablet (1,000 mg total) by mouth 2 (two) times daily with a meal. -     Ambulatory referral to Ophthalmology Continue blood sugar control as discussed in office today, low carbohydrate diet, and regular physical exercise as tolerated, 150 minutes per week (30 min each day, 5 days per week, or 50 min 3 days per week). Keep blood sugar logs with fasting goal of 90-130 mg/dl, post prandial (after you eat) less than 180.  For Hypoglycemia: BS <60 and Hyperglycemia BS >400; contact the clinic ASAP. Annual eye exams and foot exams are recommended.   Chronic left shoulder pain -     ibuprofen (ADVIL,MOTRIN) 800 MG tablet; Take 1 tablet (800 mg total) by mouth every 8 (eight) hours as needed.  Hypothyroidism, unspecified type -     TSH  Patient has been counseled on age-appropriate routine health concerns for screening and prevention. These are reviewed and up-to-date. Referrals have been placed  accordingly. Immunizations are up-to-date or declined.    Subjective:   Chief Complaint  Patient presents with  . Diabetes   HPI Joe Wallace 57 y.o. male presents to office today for follow up to DM. [name] was used to communicate directly with patient for the entire encounter including providing detailed patient instructions. He has seen neurology and was diagnosed with short term memory loss. Currently taking B12. B12 level was low normal. Will recheck in 3 months.   Disability lawyer [phone]  DM Type 2 Chronic. Improving. Monitoring fasting blood glucose levels once a day. Average fasting readings: 160-180s. A1c has decreased from 10 to 8.5. Denies any hypo or hyperglycemic symptoms. Endorses medication compliance taking glipizide 20mg  daily, metformin 1000mg  BID.  Lab Results  Component Value Date   HGBA1C 8.5 (A) 08/07/2018    Hyperlipidemia Patient presents for follow up to hyperlipidemia.  He is medication compliant taking tricor 145 mg daily. He is diet compliant and denies exertional chest pressure/discomfort, lower extremity edema, poor exercise tolerance and skin xanthelasma or statin intolerance including myalgias. LDL not at goal.  Will obtain fasting lipid panel at next office visit. He is not fasting today.   Chronic left shoulder pain Xray 06-21-2018 showed mild degenerative changes of the [facility] joint. OA controlled with sparing use of ibuprofen. Symptoms are intermittent and described as stiffness, aching, dull and sharp. Aggravating factors: none.    Review of Systems  Constitutional: Negative for fever, malaise/fatigue  and weight loss.  HENT: Negative.  Negative for nosebleeds.   Eyes: Negative.  Negative for blurred vision, double vision and photophobia.  Respiratory: Negative.  Negative for cough and shortness of breath.   Cardiovascular: Negative.  Negative for chest pain, palpitations and leg swelling.  Gastrointestinal: Negative.  Negative for heartburn,  nausea and vomiting.  Musculoskeletal: Positive for joint pain. Negative for myalgias.       SEE HPI  Neurological: Positive for tingling and sensory change. Negative for dizziness, focal weakness, seizures and headaches.  Psychiatric/Behavioral: Negative.  Negative for suicidal ideas.    Past Medical History:  Diagnosis Date  . Arthritis   . Depression   . Diabetes mellitus without complication ([place])   . Headache   . Hyperlipidemia   . Memory loss   . Thyroid neoplasm   . Visual disorder    LEFT EYE    Past Surgical History:  Procedure Laterality Date  . head trauma     L sided fragment from war.  Marland Kitchen SHOULDER ARTHROSCOPY    . THYROIDECTOMY N/A 08/13/2015   Procedure: TOTAL THYROIDECTOMY;  Surgeon: Armandina Gemma, MD;  Location: [facility];  Service: General;  Laterality: N/A;    Family History  Problem Relation Age of Onset  . Diabetes Mother   . Hypertension Mother   . Hypertension Father   . Diabetes Father     Social History Reviewed with no changes to be made today.   Outpatient Medications Prior to Visit  Medication Sig Dispense Refill  . ACCU-CHEK SOFTCLIX LANCETS lancets USE AS INSTRUCTED. Check blood glucose at home by fingerstick 2 (two) times per day. 100 each 12  . acetaminophen (TYLENOL) 500 MG tablet Take 500 mg by mouth every 6 (six) hours as needed.    Marland Kitchen albuterol (PROVENTIL HFA;VENTOLIN HFA) 108 (90 Base) MCG/ACT inhaler Inhale 2 puffs into the lungs every 6 (six) hours as needed for wheezing or shortness of breath. 1 Inhaler 2  . aspirin 81 MG tablet Take 81 mg by mouth daily.    . fenofibrate (TRICOR) 145 MG tablet Take 1 tablet (145 mg total) by mouth daily. 90 tablet 3  . glipiZIDE (GLUCOTROL XL) 10 MG 24 hr tablet Take 2 tablets (20 mg total) by mouth daily with breakfast. 180 tablet 3  . glucose blood (ACCU-CHEK AVIVA PLUS) test strip USE AS INSTRUCTED 100 each 12  . levothyroxine (SYNTHROID, LEVOTHROID) 150 MCG tablet Take 1 tablet (150 mcg total) by  mouth daily. 30 tablet 2  . NONFORMULARY OR COMPOUNDED ITEM Apply 1-2 g topically 4 (four) times daily. Bupivacaine 1 %/ Doxepin 3%/ Gabapentin 6%/ Pentoxifyline 3%/ Topiramate 1% 120 each 2  . atorvastatin (LIPITOR) 40 MG tablet Take 1 tablet (40 mg total) by mouth daily. 90 tablet 3  . DULoxetine (CYMBALTA) 30 MG capsule Take 1 capsule (30 mg total) by mouth daily. 30 capsule 1  . gabapentin (NEURONTIN) 300 MG capsule Take 600 mg by mouth 2 (two) times daily.    Marland Kitchen ibuprofen (ADVIL,MOTRIN) 800 MG tablet Take 1 tablet (800 mg total) by mouth every 8 (eight) hours as needed. 60 tablet 0  . metFORMIN (GLUCOPHAGE) 1000 MG tablet Take 1 tablet (1,000 mg total) by mouth 2 (two) times daily with a meal. 180 tablet 3   No facility-administered medications prior to visit.     No Known Allergies     Objective:    BP 129/83   Pulse 73   Temp 98.6 F (37 C) (Oral)  Resp 16   Wt 226 lb 3.2 oz (102.6 kg)   SpO2 96%   BMI 32.46 kg/m  Wt Readings from Last 3 Encounters:  08/07/18 226 lb 3.2 oz (102.6 kg)  07/03/18 222 lb 6.4 oz (100.9 kg)  06/17/18 224 lb 12.8 oz (102 kg)    Physical Exam  Constitutional: He is oriented to person, place, and time. He appears well-developed and well-nourished. He is cooperative.  HENT:  Head: Normocephalic and atraumatic.  Eyes: EOM are normal.  Neck: Normal range of motion.  Cardiovascular: Normal rate, regular rhythm, normal heart sounds and intact distal pulses. Exam reveals no gallop and no friction rub.  No murmur heard. Pulmonary/Chest: Effort normal and breath sounds normal. No tachypnea. No respiratory distress. He has no decreased breath sounds. He has no wheezes. He has no rhonchi. He has no rales. He exhibits no tenderness.  Abdominal: Soft. Bowel sounds are normal.  Musculoskeletal: Normal range of motion. He exhibits no edema.       Left shoulder: He exhibits normal range of motion, no tenderness and no swelling.  Neurological: He is alert  and oriented to person, place, and time. Coordination normal.  Skin: Skin is warm and dry.  Psychiatric: He has a normal mood and affect. His behavior is normal. Judgment and thought content normal.  Nursing note and vitals reviewed.      Patient has been counseled extensively about nutrition and exercise as well as the importance of adherence with medications and regular follow-up. The patient was given clear instructions to go to ER or return to medical center if symptoms don't improve, worsen or new problems develop. The patient verbalized understanding.   Follow-up: Return in about 3 months (around 11/07/2018) for DM; check b12.   Gildardo Pounds, FNP-BC [facility] and [facility], [place]   08/07/2018, 9:54 PM

## 2018-08-08 ENCOUNTER — Other Ambulatory Visit: Payer: Self-pay | Admitting: Nurse Practitioner

## 2018-08-08 DIAGNOSIS — E890 Postprocedural hypothyroidism: Secondary | ICD-10-CM

## 2018-08-08 LAB — TSH: TSH: 0.027 u[IU]/mL — ABNORMAL LOW (ref 0.450–4.500)

## 2018-08-08 MED ORDER — LEVOTHYROXINE SODIUM 125 MCG PO TABS: 125.0000 ug | ORAL_TABLET | Freq: Every day | ORAL | 2 refills | Status: DC

## 2018-08-12 ENCOUNTER — Telehealth: Payer: Self-pay

## 2018-08-12 NOTE — Telephone Encounter (Signed)
-----   Message from Gildardo Pounds, NP sent at 08/08/2018  8:38 PM EST ----- Please make a lab appointment to have your thyroid level checked in 4-6 weeks.

## 2018-08-12 NOTE — Telephone Encounter (Signed)
-----   Message from Gildardo Pounds, NP sent at 08/08/2018  8:37 PM EST ----- Thyroid level is still low. Will decrease thyroid medication. New script has been sent

## 2018-08-12 NOTE — Telephone Encounter (Signed)
CMA spoke to patient to inform on results.  Patient verified DOB. Patient understood.  Pt. Is aware to pick up his new Thyroid medication and to to make a lab appt within 6 weeks when he start on his new mediation for blood work for his thyroid.  Gans [phone] assist with the call.

## 2018-09-16 DIAGNOSIS — H53132 Sudden visual loss, left eye: Secondary | ICD-10-CM | POA: Diagnosis not present

## 2018-09-16 DIAGNOSIS — E119 Type 2 diabetes mellitus without complications: Secondary | ICD-10-CM | POA: Diagnosis not present

## 2018-09-16 DIAGNOSIS — H25813 Combined forms of age-related cataract, bilateral: Secondary | ICD-10-CM | POA: Diagnosis not present

## 2018-10-04 DIAGNOSIS — H25813 Combined forms of age-related cataract, bilateral: Secondary | ICD-10-CM | POA: Diagnosis not present

## 2018-10-04 DIAGNOSIS — H538 Other visual disturbances: Secondary | ICD-10-CM | POA: Diagnosis not present

## 2018-10-04 DIAGNOSIS — E119 Type 2 diabetes mellitus without complications: Secondary | ICD-10-CM | POA: Diagnosis not present

## 2018-10-07 ENCOUNTER — Ambulatory Visit: Payer: Medicaid Other | Admitting: Psychology

## 2018-10-08 DIAGNOSIS — H5213 Myopia, bilateral: Secondary | ICD-10-CM | POA: Diagnosis not present

## 2018-10-11 ENCOUNTER — Ambulatory Visit: Payer: Medicaid Other | Attending: Nurse Practitioner

## 2018-10-11 ENCOUNTER — Other Ambulatory Visit: Payer: Self-pay | Admitting: *Deleted

## 2018-10-11 DIAGNOSIS — E039 Hypothyroidism, unspecified: Secondary | ICD-10-CM | POA: Insufficient documentation

## 2018-10-11 MED FILL — glipiZIDE XL 10 MG TB24: 10 | 30 days supply | Qty: 60 | Fill #0

## 2018-10-11 MED FILL — ACCU-CHEK AVIVA PLUS TEST S: 25 days supply | Qty: 100 | Fill #1

## 2018-10-12 LAB — TSH: TSH: 2.88 u[IU]/mL (ref 0.450–4.500)

## 2018-10-14 ENCOUNTER — Other Ambulatory Visit: Payer: Self-pay | Admitting: Nurse Practitioner

## 2018-10-14 DIAGNOSIS — E890 Postprocedural hypothyroidism: Secondary | ICD-10-CM

## 2018-10-14 MED ORDER — LEVOTHYROXINE SODIUM 125 MCG PO TABS: 125.0000 ug | ORAL_TABLET | Freq: Every day | ORAL | 2 refills | Status: DC

## 2018-10-18 ENCOUNTER — Telehealth (INDEPENDENT_AMBULATORY_CARE_PROVIDER_SITE_OTHER): Payer: Self-pay

## 2018-10-18 NOTE — Telephone Encounter (Signed)
-----   Message from Gildardo Pounds, NP sent at 10/14/2018 12:02 AM EST ----- Thyroid level is normal. Will continue on same dose of synthroid for now and recheck your TSH level in 6 weeks. Please make an appointment.

## 2018-10-18 NOTE — Telephone Encounter (Signed)
Call placed using pacific interpreter Izora Gala [phone]) patient is aware that thyroid level is normal. Continue on current dose of synthroid for now and recheck TSH level in six weeks. Patient is scheduled for 3/6 at 9:30 am for thyroid recheck. Nat Christen, CMA

## 2018-11-08 ENCOUNTER — Ambulatory Visit: Payer: Medicaid Other | Admitting: Nurse Practitioner

## 2018-11-19 ENCOUNTER — Ambulatory Visit: Payer: Medicaid Other | Admitting: Nurse Practitioner

## 2018-11-19 DIAGNOSIS — H524 Presbyopia: Secondary | ICD-10-CM | POA: Diagnosis not present

## 2018-11-29 ENCOUNTER — Other Ambulatory Visit: Payer: Medicaid Other

## 2018-12-02 ENCOUNTER — Ambulatory Visit: Payer: Medicaid Other | Attending: Family Medicine

## 2018-12-02 DIAGNOSIS — E890 Postprocedural hypothyroidism: Secondary | ICD-10-CM | POA: Diagnosis not present

## 2018-12-02 DIAGNOSIS — E039 Hypothyroidism, unspecified: Secondary | ICD-10-CM | POA: Diagnosis not present

## 2018-12-03 LAB — TSH: TSH: 4.27 u[IU]/mL (ref 0.450–4.500)

## 2018-12-05 ENCOUNTER — Other Ambulatory Visit: Payer: Self-pay | Admitting: Nurse Practitioner

## 2018-12-05 DIAGNOSIS — E890 Postprocedural hypothyroidism: Secondary | ICD-10-CM

## 2018-12-05 MED ORDER — LEVOTHYROXINE SODIUM 125 MCG PO TABS: 125.0000 ug | ORAL_TABLET | Freq: Every day | ORAL | 2 refills | Status: DC

## 2018-12-06 ENCOUNTER — Telehealth: Payer: Self-pay

## 2018-12-06 NOTE — Telephone Encounter (Signed)
-----   Message from Gildardo Pounds, NP sent at 12/05/2018 10:55 PM EDT ----- TSH is normal. Continue synthroid as prescribed. Will send prescription to pharmacy

## 2018-12-06 NOTE — Telephone Encounter (Signed)
CMA spoke to patient to inform on results.  Pt. Verified DOB. Pt understood.  Arabic interpreter assist with the call.

## 2019-01-01 ENCOUNTER — Other Ambulatory Visit: Payer: Self-pay

## 2019-01-01 ENCOUNTER — Encounter: Payer: Self-pay | Admitting: Nurse Practitioner

## 2019-01-01 ENCOUNTER — Ambulatory Visit: Payer: Medicaid Other | Admitting: Nurse Practitioner

## 2019-01-01 ENCOUNTER — Ambulatory Visit: Payer: Medicaid Other | Attending: Nurse Practitioner | Admitting: Nurse Practitioner

## 2019-01-01 VITALS — BP 134/78 | HR 66 | Temp 98.1°F | Ht 70.0 in | Wt 233.4 lb

## 2019-01-01 DIAGNOSIS — Z1211 Encounter for screening for malignant neoplasm of colon: Secondary | ICD-10-CM | POA: Diagnosis not present

## 2019-01-01 DIAGNOSIS — Z7984 Long term (current) use of oral hypoglycemic drugs: Secondary | ICD-10-CM | POA: Diagnosis not present

## 2019-01-01 DIAGNOSIS — Z7982 Long term (current) use of aspirin: Secondary | ICD-10-CM | POA: Diagnosis not present

## 2019-01-01 DIAGNOSIS — C73 Malignant neoplasm of thyroid gland: Secondary | ICD-10-CM | POA: Diagnosis not present

## 2019-01-01 DIAGNOSIS — E559 Vitamin D deficiency, unspecified: Secondary | ICD-10-CM | POA: Diagnosis not present

## 2019-01-01 DIAGNOSIS — Z79890 Hormone replacement therapy (postmenopausal): Secondary | ICD-10-CM | POA: Insufficient documentation

## 2019-01-01 DIAGNOSIS — E781 Pure hyperglyceridemia: Secondary | ICD-10-CM | POA: Diagnosis not present

## 2019-01-01 DIAGNOSIS — E1165 Type 2 diabetes mellitus with hyperglycemia: Secondary | ICD-10-CM | POA: Diagnosis not present

## 2019-01-01 DIAGNOSIS — E1142 Type 2 diabetes mellitus with diabetic polyneuropathy: Secondary | ICD-10-CM

## 2019-01-01 DIAGNOSIS — E782 Mixed hyperlipidemia: Secondary | ICD-10-CM | POA: Diagnosis not present

## 2019-01-01 DIAGNOSIS — Z23 Encounter for immunization: Secondary | ICD-10-CM | POA: Insufficient documentation

## 2019-01-01 DIAGNOSIS — E785 Hyperlipidemia, unspecified: Secondary | ICD-10-CM | POA: Insufficient documentation

## 2019-01-01 DIAGNOSIS — Z79899 Other long term (current) drug therapy: Secondary | ICD-10-CM | POA: Diagnosis not present

## 2019-01-01 DIAGNOSIS — Z833 Family history of diabetes mellitus: Secondary | ICD-10-CM | POA: Insufficient documentation

## 2019-01-01 DIAGNOSIS — M1990 Unspecified osteoarthritis, unspecified site: Secondary | ICD-10-CM | POA: Insufficient documentation

## 2019-01-01 LAB — POCT GLYCOSYLATED HEMOGLOBIN (HGB A1C): Hemoglobin A1C: 8.5 % — AB (ref 4.0–5.6)

## 2019-01-01 LAB — GLUCOSE, POCT (MANUAL RESULT ENTRY): POC Glucose: 172 mg/dl — AB (ref 70–99)

## 2019-01-01 MED ORDER — ATORVASTATIN CALCIUM 40 MG PO TABS: 40.0000 mg | ORAL_TABLET | Freq: Every day | ORAL | 3 refills | Status: DC

## 2019-01-01 MED ORDER — SITAGLIPTIN PHOSPHATE 25 MG PO TABS: 25.0000 mg | ORAL_TABLET | Freq: Every day | ORAL | 0 refills | Status: DC

## 2019-01-01 MED ORDER — ACCU-CHEK SOFTCLIX LANCETS MISC: 12 refills | Status: DC

## 2019-01-01 MED ORDER — GLUCOSE BLOOD VI STRP: ORAL_STRIP | 12 refills | Status: DC

## 2019-01-01 NOTE — Progress Notes (Signed)
Assessment & Plan:  Joe Wallace was seen today for follow-up.  Diagnoses and all orders for this visit:  Type 2 diabetes mellitus with diabetic polyneuropathy, without long-term current use of insulin (HCC) -     Glucose (CBG) -     HgB A1c -     Vitamin B12 -     CBC -     CMP14+EGFR -     glucose blood (ACCU-CHEK AVIVA PLUS) test strip; USE AS INSTRUCTED -     Accu-Chek Softclix Lancets lancets; USE AS INSTRUCTED. Check blood glucose at home by fingerstick 2 (two) times per day. -     Pneumococcal polysaccharide vaccine 23-valent greater than or equal to 2yo subcutaneous/IM -     sitaGLIPtin (JANUVIA) 25 MG tablet; Take 1 tablet (25 mg total) by mouth daily. -     Ambulatory referral to Ophthalmology Diabetes is poorly controlled. Advised patient to keep a fasting blood sugar log fast, 2 hours post lunch and bedtime which will be reviewed at the next office visit.  Dyslipidemia -     Lipid panel -     atorvastatin (LIPITOR) 40 MG tablet; Take 1 tablet (40 mg total) by mouth daily. INSTRUCTIONS: Work on a low fat, heart healthy diet and participate in regular aerobic exercise program by working out at least 150 minutes per week; 5 days a week-30 minutes per day. Avoid red meat, fried foods. junk foods, sodas, sugary drinks, unhealthy snacking, alcohol and smoking.  Drink at least 48oz of water per day and monitor your carbohydrate intake daily.   Colon cancer screening -     Ambulatory referral to Gastroenterology  Vitamin D deficiency disease -     VITAMIN D 25 Hydroxy (Vit-D Deficiency, Fractures)  Mixed hypertriglyceridemia INSTRUCTIONS: Work on a low fat, heart healthy diet and participate in regular aerobic exercise program by working out at least 150 minutes per week; 5 days a week-30 minutes per day. Avoid red meat, fried foods. junk foods, sodas, sugary drinks, unhealthy snacking, alcohol and smoking.  Drink at least 48oz of water per day and monitor your carbohydrate intake  daily.    Immunization due -     Pneumococcal polysaccharide vaccine 23-valent greater than or equal to 2yo subcutaneous/IM    Patient has been counseled on age-appropriate routine health concerns for screening and prevention. These are reviewed and up-to-date. Referrals have been placed accordingly. Immunizations are up-to-date or declined.    Subjective:   Chief Complaint  Patient presents with  . Follow-up    Pt. is here for 3 months follow-up. Pt. have not eat anything yet.    HPI Joe Wallace 58 y.o. male presents to office today for follow up to DM and HPL.   DM TYPE 2 Chronic. Not well controlled or at goal of <7.  Monitoring his blood glucose levels 2 times per day: 130-140s Fasting. Post prandial 160-170s. He endorses medication compliance taking metformin 1000 mg twice daily and glipizide 20 mg daily.  We will add Januvia 25 mg daily today to current regimen.  He denies any hypoglycemic symptoms but does endorse polyneuropathy. Lab Results  Component Value Date   HGBA1C 8.5 (A) 01/01/2019   Lab Results  Component Value Date   HGBA1C 8.5 (A) 08/07/2018    Dyslipidemia Patient presents for follow up to hyperlipidemia.  He is medication compliant taking atorvastatin 40 mg daily and TriCor 145 mg daily. He is not coming diet compliant and denies chest pain, dyspnea, exertional  chest pressure/discomfort, lower extremity edema, poor exercise tolerance and skin xanthelasma or statin intolerance including myalgias.  Lab Results  Component Value Date   CHOL 192 05/06/2018   Lab Results  Component Value Date   HDL 36 (L) 05/06/2018   Lab Results  Component Value Date   LDLCALC 98 05/06/2018   Lab Results  Component Value Date   TRIG 289 (H) 05/06/2018   Lab Results  Component Value Date   CHOLHDL 5.3 (H) 05/06/2018  Review of Systems  Constitutional: Negative for fever, malaise/fatigue and weight loss.  HENT: Negative.  Negative for nosebleeds.   Eyes:  Negative.  Negative for blurred vision, double vision and photophobia.  Respiratory: Negative.  Negative for cough and shortness of breath.   Cardiovascular: Negative.  Negative for chest pain, palpitations and leg swelling.  Gastrointestinal: Negative.  Negative for heartburn, nausea and vomiting.  Musculoskeletal: Positive for joint pain (OA). Negative for myalgias.  Neurological: Negative.  Negative for dizziness, focal weakness, seizures and headaches.  Psychiatric/Behavioral: Negative.  Negative for suicidal ideas.    Past Medical History:  Diagnosis Date  . Arthritis   . Depression   . Diabetes mellitus without complication ([place])   . Headache   . Hyperlipidemia   . Memory loss   . Thyroid neoplasm   . Visual disorder    LEFT EYE    Past Surgical History:  Procedure Laterality Date  . head trauma     L sided fragment from war.  Marland Kitchen SHOULDER ARTHROSCOPY    . THYROIDECTOMY N/A 08/13/2015   Procedure: TOTAL THYROIDECTOMY;  Surgeon: Armandina Gemma, MD;  Location: [facility];  Service: General;  Laterality: N/A;    Family History  Problem Relation Age of Onset  . Diabetes Mother   . Hypertension Mother   . Hypertension Father   . Diabetes Father     Social History Reviewed with no changes to be made today.   Outpatient Medications Prior to Visit  Medication Sig Dispense Refill  . acetaminophen (TYLENOL) 500 MG tablet Take 500 mg by mouth every 6 (six) hours as needed.    Marland Kitchen albuterol (PROVENTIL HFA;VENTOLIN HFA) 108 (90 Base) MCG/ACT inhaler Inhale 2 puffs into the lungs every 6 (six) hours as needed for wheezing or shortness of breath. 1 Inhaler 2  . aspirin 81 MG tablet Take 81 mg by mouth daily.    . fenofibrate (TRICOR) 145 MG tablet Take 1 tablet (145 mg total) by mouth daily. 90 tablet 3  . levothyroxine (SYNTHROID, LEVOTHROID) 125 MCG tablet Take 1 tablet (125 mcg total) by mouth daily. 90 tablet 2  . metFORMIN (GLUCOPHAGE) 1000 MG tablet Take 1 tablet (1,000 mg total) by  mouth 2 (two) times daily with a meal. 180 tablet 3  . ACCU-CHEK SOFTCLIX LANCETS lancets USE AS INSTRUCTED. Check blood glucose at home by fingerstick 2 (two) times per day. 100 each 12  . atorvastatin (LIPITOR) 40 MG tablet Take 1 tablet (40 mg total) by mouth daily. 90 tablet 3  . glucose blood (ACCU-CHEK AVIVA PLUS) test strip USE AS INSTRUCTED 100 each 12  . DULoxetine (CYMBALTA) 30 MG capsule Take 1 capsule (30 mg total) by mouth daily. 90 capsule 1  . glipiZIDE (GLUCOTROL XL) 10 MG 24 hr tablet Take 2 tablets (20 mg total) by mouth daily with breakfast. 180 tablet 3  . NONFORMULARY OR COMPOUNDED ITEM Apply 1-2 g topically 4 (four) times daily. Bupivacaine 1 %/ Doxepin 3%/ Gabapentin 6%/ Pentoxifyline 3%/ Topiramate 1% (  Patient not taking: Reported on 01/01/2019) 120 each 2   No facility-administered medications prior to visit.     No Known Allergies     Objective:    BP 134/78 (BP Location: Left Arm, Patient Position: Sitting, Cuff Size: Large)   Pulse 66   Temp 98.1 F (36.7 C) (Oral)   Ht '5\' 10"'$  (1.778 m)   Wt 233 lb 6.4 oz (105.9 kg)   SpO2 96%   BMI 33.49 kg/m  Wt Readings from Last 3 Encounters:  01/01/19 233 lb 6.4 oz (105.9 kg)  08/07/18 226 lb 3.2 oz (102.6 kg)  07/03/18 222 lb 6.4 oz (100.9 kg)    Physical Exam Vitals signs and nursing note reviewed.  Constitutional:      Appearance: He is well-developed.  HENT:     Head: Normocephalic and atraumatic.  Neck:     Musculoskeletal: Normal range of motion.  Cardiovascular:     Rate and Rhythm: Normal rate and regular rhythm.     Heart sounds: Normal heart sounds. No murmur. No friction rub. No gallop.   Pulmonary:     Effort: Pulmonary effort is normal. No tachypnea or respiratory distress.     Breath sounds: Normal breath sounds. No decreased breath sounds, wheezing, rhonchi or rales.  Chest:     Chest wall: No tenderness.  Abdominal:     General: Bowel sounds are normal.     Palpations: Abdomen is soft.   Musculoskeletal: Normal range of motion.  Skin:    General: Skin is warm and dry.  Neurological:     Mental Status: He is alert and oriented to person, place, and time.     Coordination: Coordination normal.  Psychiatric:        Behavior: Behavior normal. Behavior is cooperative.        Thought Content: Thought content normal.        Judgment: Judgment normal.          Patient has been counseled extensively about nutrition and exercise as well as the importance of adherence with medications and regular follow-up. The patient was given clear instructions to go to ER or return to medical center if symptoms don't improve, worsen or new problems develop. The patient verbalized understanding.   Follow-up: Return in about 3 months (around 04/02/2019).   Gildardo Pounds, FNP-BC [place] and [facility] [place], [place]   01/01/2019, 11:29 AM

## 2019-01-02 ENCOUNTER — Other Ambulatory Visit: Payer: Self-pay | Admitting: Pharmacist

## 2019-01-02 DIAGNOSIS — E1142 Type 2 diabetes mellitus with diabetic polyneuropathy: Secondary | ICD-10-CM

## 2019-01-02 LAB — CBC
Hematocrit: 42.3 % (ref 37.5–51.0)
Hemoglobin: 13.2 g/dL (ref 13.0–17.7)
MCH: 26 pg — ABNORMAL LOW (ref 26.6–33.0)
MCHC: 31.2 g/dL — ABNORMAL LOW (ref 31.5–35.7)
MCV: 83 fL (ref 79–97)
Platelets: 258 10*3/uL (ref 150–450)
RBC: 5.08 x10E6/uL (ref 4.14–5.80)
RDW: 12.9 % (ref 11.6–15.4)
WBC: 7.4 10*3/uL (ref 3.4–10.8)

## 2019-01-02 LAB — CMP14+EGFR
ALT: 20 IU/L (ref 0–44)
AST: 16 IU/L (ref 0–40)
Albumin/Globulin Ratio: 1.4 (ref 1.2–2.2)
Albumin: 4.3 g/dL (ref 3.8–4.9)
Alkaline Phosphatase: 66 IU/L (ref 39–117)
BUN/Creatinine Ratio: 15 (ref 10–24)
BUN: 16 mg/dL (ref 8–27)
Bilirubin Total: 0.3 mg/dL (ref 0.0–1.2)
CO2: 24 mmol/L (ref 20–29)
Calcium: 9.6 mg/dL (ref 8.6–10.2)
Chloride: 98 mmol/L (ref 96–106)
Creatinine, Ser: 1.09 mg/dL (ref 0.76–1.27)
GFR calc Af Amer: 85 mL/min/{1.73_m2} (ref >59)
GFR calc non Af Amer: 73 mL/min/{1.73_m2} (ref >59)
Globulin, Total: 3.1 g/dL (ref 1.5–4.5)
Glucose: 162 mg/dL — ABNORMAL HIGH (ref 65–99)
Potassium: 4.5 mmol/L (ref 3.5–5.2)
Sodium: 136 mmol/L (ref 134–144)
Total Protein: 7.4 g/dL (ref 6.0–8.5)

## 2019-01-02 LAB — VITAMIN B12: Vitamin B-12: 234 pg/mL (ref 232–1245)

## 2019-01-02 LAB — LIPID PANEL
Chol/HDL Ratio: 5.6 ratio — ABNORMAL HIGH (ref 0.0–5.0)
Cholesterol, Total: 222 mg/dL — ABNORMAL HIGH (ref 100–199)
HDL: 40 mg/dL (ref >39)
LDL Calculated: 129 mg/dL — ABNORMAL HIGH (ref 0–99)
Triglycerides: 266 mg/dL — ABNORMAL HIGH (ref 0–149)
VLDL Cholesterol Cal: 53 mg/dL — ABNORMAL HIGH (ref 5–40)

## 2019-01-02 LAB — VITAMIN D 25 HYDROXY (VIT D DEFICIENCY, FRACTURES): Vit D, 25-Hydroxy: 15 ng/mL — ABNORMAL LOW (ref 30.0–100.0)

## 2019-01-02 MED ORDER — GLUCOSE BLOOD VI STRP: ORAL_STRIP | 12 refills | Status: DC

## 2019-01-13 ENCOUNTER — Telehealth: Payer: Self-pay

## 2019-01-13 NOTE — Telephone Encounter (Signed)
-----   Message from Gildardo Pounds, NP sent at 01/06/2019  8:50 AM EDT ----- B12 is low normal. Will continue to monitor. CBC does not show any anemia at this time. Kidney and liver function are normal. Glucose is elevated. Please try to monitor your intake of carbs, sweets and fried foods. Eat more lean proteins like chicken, fish or [place] which should be baked or broiled and plenty of vegetables

## 2019-01-13 NOTE — Telephone Encounter (Signed)
CMA spoke to patient to inform on lab results.  Patient verified DOB. Pt. Understood.  Arabic interpreter assist with the call [phone].

## 2019-04-02 ENCOUNTER — Ambulatory Visit: Payer: Medicaid Other | Admitting: Nurse Practitioner

## 2019-05-14 ENCOUNTER — Other Ambulatory Visit: Payer: Self-pay | Admitting: Nurse Practitioner

## 2019-05-14 DIAGNOSIS — E782 Mixed hyperlipidemia: Secondary | ICD-10-CM

## 2019-07-06 ENCOUNTER — Other Ambulatory Visit: Payer: Self-pay | Admitting: Nurse Practitioner

## 2019-07-06 DIAGNOSIS — E1169 Type 2 diabetes mellitus with other specified complication: Secondary | ICD-10-CM

## 2019-07-07 ENCOUNTER — Ambulatory Visit: Payer: Medicaid Other | Admitting: Nurse Practitioner

## 2019-07-09 ENCOUNTER — Other Ambulatory Visit: Payer: Self-pay | Admitting: Family Medicine

## 2019-07-09 DIAGNOSIS — E1169 Type 2 diabetes mellitus with other specified complication: Secondary | ICD-10-CM

## 2019-07-24 ENCOUNTER — Other Ambulatory Visit: Payer: Self-pay | Admitting: Nurse Practitioner

## 2019-07-24 DIAGNOSIS — E890 Postprocedural hypothyroidism: Secondary | ICD-10-CM

## 2019-07-31 ENCOUNTER — Other Ambulatory Visit: Payer: Self-pay | Admitting: *Deleted

## 2019-08-01 ENCOUNTER — Encounter: Payer: Self-pay | Admitting: Nurse Practitioner

## 2019-08-01 ENCOUNTER — Ambulatory Visit: Payer: Medicaid Other | Attending: Nurse Practitioner | Admitting: Nurse Practitioner

## 2019-08-01 ENCOUNTER — Other Ambulatory Visit: Payer: Self-pay

## 2019-08-01 VITALS — BP 118/74 | HR 79 | Temp 98.2°F | Resp 18 | Wt 234.4 lb

## 2019-08-01 DIAGNOSIS — Z8249 Family history of ischemic heart disease and other diseases of the circulatory system: Secondary | ICD-10-CM | POA: Insufficient documentation

## 2019-08-01 DIAGNOSIS — E782 Mixed hyperlipidemia: Secondary | ICD-10-CM

## 2019-08-01 DIAGNOSIS — Z79899 Other long term (current) drug therapy: Secondary | ICD-10-CM | POA: Insufficient documentation

## 2019-08-01 DIAGNOSIS — Z833 Family history of diabetes mellitus: Secondary | ICD-10-CM | POA: Insufficient documentation

## 2019-08-01 DIAGNOSIS — G8929 Other chronic pain: Secondary | ICD-10-CM | POA: Insufficient documentation

## 2019-08-01 DIAGNOSIS — Z7982 Long term (current) use of aspirin: Secondary | ICD-10-CM | POA: Insufficient documentation

## 2019-08-01 DIAGNOSIS — E781 Pure hyperglyceridemia: Secondary | ICD-10-CM | POA: Diagnosis not present

## 2019-08-01 DIAGNOSIS — Z125 Encounter for screening for malignant neoplasm of prostate: Secondary | ICD-10-CM | POA: Diagnosis not present

## 2019-08-01 DIAGNOSIS — Z7984 Long term (current) use of oral hypoglycemic drugs: Secondary | ICD-10-CM | POA: Diagnosis not present

## 2019-08-01 DIAGNOSIS — Z79890 Hormone replacement therapy (postmenopausal): Secondary | ICD-10-CM | POA: Insufficient documentation

## 2019-08-01 DIAGNOSIS — M79644 Pain in right finger(s): Secondary | ICD-10-CM | POA: Insufficient documentation

## 2019-08-01 DIAGNOSIS — E890 Postprocedural hypothyroidism: Secondary | ICD-10-CM | POA: Diagnosis not present

## 2019-08-01 DIAGNOSIS — E1165 Type 2 diabetes mellitus with hyperglycemia: Secondary | ICD-10-CM | POA: Insufficient documentation

## 2019-08-01 DIAGNOSIS — Z1211 Encounter for screening for malignant neoplasm of colon: Secondary | ICD-10-CM

## 2019-08-01 DIAGNOSIS — E1142 Type 2 diabetes mellitus with diabetic polyneuropathy: Secondary | ICD-10-CM | POA: Diagnosis not present

## 2019-08-01 DIAGNOSIS — Z85850 Personal history of malignant neoplasm of thyroid: Secondary | ICD-10-CM

## 2019-08-01 LAB — POCT GLYCOSYLATED HEMOGLOBIN (HGB A1C): Hemoglobin A1C: 10.3 % — AB (ref 4.0–5.6)

## 2019-08-01 MED ORDER — DULOXETINE HCL 30 MG PO CPEP: 30.0000 mg | ORAL_CAPSULE | Freq: Every day | ORAL | 1 refills | Status: DC

## 2019-08-01 MED ORDER — INSULIN GLARGINE 100 UNIT/ML SOLOSTAR PEN: 30.0000 [IU] | PEN_INJECTOR | Freq: Every day | SUBCUTANEOUS | 11 refills | Status: DC

## 2019-08-01 MED ORDER — INSULIN GLARGINE 100 UNIT/ML SOLOSTAR PEN: 20.0000 [IU] | PEN_INJECTOR | Freq: Every day | SUBCUTANEOUS | 11 refills | Status: DC

## 2019-08-01 MED ORDER — BD PEN NEEDLE MINI U/F 31G X 5 MM MISC: 6 refills | Status: DC

## 2019-08-01 MED ORDER — PREDNISONE 20 MG PO TABS: 20.0000 mg | ORAL_TABLET | Freq: Every day | ORAL | 0 refills | Status: AC

## 2019-08-01 MED ORDER — ACCU-CHEK AVIVA PLUS VI STRP: ORAL_STRIP | 12 refills | Status: DC

## 2019-08-01 MED ORDER — GLIPIZIDE ER 10 MG PO TB24: ORAL_TABLET | ORAL | 1 refills | Status: DC

## 2019-08-01 MED ORDER — METFORMIN HCL 1000 MG PO TABS: 1000.0000 mg | ORAL_TABLET | Freq: Two times a day (BID) | ORAL | 3 refills | Status: DC

## 2019-08-01 MED ORDER — FENOFIBRATE 145 MG PO TABS: 145.0000 mg | ORAL_TABLET | Freq: Every day | ORAL | 1 refills | Status: DC

## 2019-08-01 NOTE — Progress Notes (Signed)
Assessment & Plan:  Joe Wallace was seen today for diabetes.  Diagnoses and all orders for this visit:  Type 2 diabetes mellitus with diabetic polyneuropathy, without long-term current use of insulin (HCC) -     HgB A1c -     urine micro -     CMP14+EGFR -     Lipid Panel -     Insulin Pen Needle (B-D UF III MINI PEN NEEDLES) 31G X 5 MM MISC; Use as instructed. Inject into the skin once nightly. -     DULoxetine (CYMBALTA) 30 MG capsule; Take 1 capsule (30 mg total) by mouth daily. -     glucose blood (ACCU-CHEK AVIVA PLUS) test strip; Use as instructed to check blood sugar up to 3 times daily. -     glipiZIDE (GLUCOTROL XL) 10 MG 24 hr tablet; TAKE 2 TABLETS BY MOUTH ONCE DAILY WITH BREAKFAST -     metFORMIN (GLUCOPHAGE) 1000 MG tablet; Take 1 tablet (1,000 mg total) by mouth 2 (two) times daily with a meal. -     Insulin Glargine (LANTUS) 100 UNIT/ML Solostar Pen; Inject 20 Units into the skin daily at 10 pm. -     Ambulatory referral to diabetic education  Mixed hypertriglyceridemia -     fenofibrate (TRICOR) 145 MG tablet; Take 1 tablet (145 mg total) by mouth daily. INSTRUCTIONS: Work on a low fat, heart healthy diet and participate in regular aerobic exercise program by working out at least 150 minutes per week; 5 days a week-30 minutes per day. Avoid red meat/beef/steak,  fried foods. junk foods, sodas, sugary drinks, unhealthy snacking, alcohol and smoking.  Drink at least 80 oz of water per day and monitor your carbohydrate intake daily.   Prostate cancer screening -     PSA  Postoperative hypothyroidism -     TSH  Chronic pain of right thumb -     Uric Acid -     predniSONE (DELTASONE) 20 MG tablet; Take 1 tablet (20 mg total) by mouth daily with breakfast for 7 days. -     Ambulatory referral to Hand Surgery-patient requested referral. I have recommended he take the prednisone first and then would place referral however he declines.   Colon cancer screening -      Ambulatory referral to Gastroenterology     Patient has been counseled on age-appropriate routine health concerns for screening and prevention. These are reviewed and up-to-date. Referrals have been placed accordingly. Immunizations are up-to-date or declined.    Subjective:   Chief Complaint  Patient presents with   Diabetes   HPI Joe Wallace 58 y.o. male presents to office today for follow up.  has a past medical history of Arthritis, Depression, Diabetes mellitus without complication ([place]), Headache, Hyperlipidemia, Memory loss, Thyroid neoplasm, and Visual disorder.  [name] was used to communicate directly with patient for the entire encounter including providing detailed patient instructions.  His son is also accompanying him today.    DM TYPE 2 Poorly controlled. Lowest A1c in 4 years was 8.2. Mostly 10-11. He is not diet or exercise compliant. We had a long discussion today regarding his diet and lack of movement. Will refer to nutritionist. He is not consistently monitoring his blood glucose levels. Denies any hypoglycemic symptoms. Takes cymbalta for neuropathy. Currently taking metformin 1000 mg BID,  glipizide 20 mg daily. Will add lantus 20 units today. The pharmacist was kind enough to come show Mr. Freshour how to use his insulin pen.  Will have him return in 3-4 weeks for meter/log check. He saw a nutritionist 5 years ago. Will refer again for updated teaching.  Lab Results  Component Value Date   HGBA1C 10.3 (A) 08/01/2019    Hyperlipidemia Patient presents for follow up to hyperlipidemia. POOR DIETARY ADHERENCE.   He is medication compliant taking atorvastatin 40 mg daily. He denies statin intolerance including myalgias.  Lab Results  Component Value Date   CHOL 222 (H) 01/01/2019   Lab Results  Component Value Date   HDL 40 01/01/2019   Lab Results  Component Value Date   LDLCALC 129 (H) 01/01/2019   Lab Results  Component Value Date   TRIG 266 (H)  01/01/2019   Lab Results  Component Value Date   CHOLHDL 5.6 (H) 01/01/2019    Hypothyroidism Well controlled. He endorses medication adherence taking synthroid 125 mg daily. He denies fatigue, weight changes, heat/cold intolerance, bowel/skin changes or CVS symptoms Lab Results  Component Value Date   TSH 4.270 12/02/2018   T3TOTAL 108 05/06/2018    Joint Pain: Patient complains of arthralgias for which has been present for 1 month. Pain is located in right thumb, is described as aching, sharp and throbbing, and is constant .  Associated symptoms include: decreased range of motion and and inability to flex the thumb.  The patient has tried OTC analgesics without reliefRelated to injury:   no.   Review of Systems  Constitutional: Negative for fever, malaise/fatigue and weight loss.  HENT: Negative.  Negative for nosebleeds.   Eyes: Negative.  Negative for blurred vision, double vision and photophobia.  Respiratory: Negative.  Negative for cough and shortness of breath.   Cardiovascular: Negative.  Negative for chest pain, palpitations and leg swelling.  Gastrointestinal: Negative.  Negative for heartburn, nausea and vomiting.  Musculoskeletal: Positive for joint pain. Negative for myalgias.  Neurological: Negative.  Negative for dizziness, focal weakness, seizures and headaches.  Psychiatric/Behavioral: Positive for depression and memory loss. Negative for suicidal ideas.    Past Medical History:  Diagnosis Date   Arthritis    Depression    Diabetes mellitus without complication ([place])    Headache    Hyperlipidemia    Memory loss    Thyroid neoplasm    Visual disorder    LEFT EYE    Past Surgical History:  Procedure Laterality Date   head trauma     L sided fragment from war.   SHOULDER ARTHROSCOPY     THYROIDECTOMY N/A 08/13/2015   Procedure: TOTAL THYROIDECTOMY;  Surgeon: Armandina Gemma, MD;  Location: [facility];  Service: General;  Laterality: N/A;    Family  History  Problem Relation Age of Onset   Diabetes Mother    Hypertension Mother    Hypertension Father    Diabetes Father     Social History Reviewed with no changes to be made today.   Outpatient Medications Prior to Visit  Medication Sig Dispense Refill   Accu-Chek Softclix Lancets lancets USE AS INSTRUCTED. Check blood glucose at home by fingerstick 2 (two) times per day. 100 each 12   aspirin 81 MG tablet Take 81 mg by mouth daily.     atorvastatin (LIPITOR) 40 MG tablet Take 1 tablet (40 mg total) by mouth daily. 90 tablet 3   levothyroxine (SYNTHROID) 125 MCG tablet Take 1 tablet by mouth once daily 30 tablet 0   DULoxetine (CYMBALTA) 30 MG capsule Take 1 capsule (30 mg total) by mouth daily. 90 capsule 1  fenofibrate (TRICOR) 145 MG tablet Take 1 tablet by mouth once daily 90 tablet 0   glipiZIDE (GLUCOTROL XL) 10 MG 24 hr tablet TAKE 2 TABLETS BY MOUTH ONCE DAILY WITH BREAKFAST 60 tablet 0   glucose blood (ACCU-CHEK AVIVA PLUS) test strip Use as instructed to check blood sugar up to 3 times daily. 100 each 12   metFORMIN (GLUCOPHAGE) 1000 MG tablet Take 1 tablet (1,000 mg total) by mouth 2 (two) times daily with a meal. 180 tablet 3   acetaminophen (TYLENOL) 500 MG tablet Take 500 mg by mouth every 6 (six) hours as needed.     albuterol (PROVENTIL HFA;VENTOLIN HFA) 108 (90 Base) MCG/ACT inhaler Inhale 2 puffs into the lungs every 6 (six) hours as needed for wheezing or shortness of breath. 1 Inhaler 2   NONFORMULARY OR COMPOUNDED ITEM Apply 1-2 g topically 4 (four) times daily. Bupivacaine 1 %/ Doxepin 3%/ Gabapentin 6%/ Pentoxifyline 3%/ Topiramate 1% (Patient not taking: Reported on 01/01/2019) 120 each 2   sitaGLIPtin (JANUVIA) 25 MG tablet Take 1 tablet (25 mg total) by mouth daily. 90 tablet 0   No facility-administered medications prior to visit.     No Known Allergies     Objective:    BP 118/74    Pulse 79    Temp 98.2 F (36.8 C) (Temporal)    Resp  18    Wt 234 lb 6.4 oz (106.3 kg)    SpO2 96%    BMI 33.63 kg/m  Wt Readings from Last 3 Encounters:  08/01/19 234 lb 6.4 oz (106.3 kg)  01/01/19 233 lb 6.4 oz (105.9 kg)  08/07/18 226 lb 3.2 oz (102.6 kg)    Physical Exam Vitals signs and nursing note reviewed.  Constitutional:      Appearance: He is well-developed.  HENT:     Head: Normocephalic and atraumatic.  Neck:     Musculoskeletal: Normal range of motion.  Cardiovascular:     Rate and Rhythm: Normal rate and regular rhythm.     Heart sounds: Normal heart sounds. No murmur. No friction rub. No gallop.   Pulmonary:     Effort: Pulmonary effort is normal. No tachypnea or respiratory distress.     Breath sounds: Normal breath sounds. No decreased breath sounds, wheezing, rhonchi or rales.  Chest:     Chest wall: No tenderness.  Abdominal:     General: Bowel sounds are normal.     Palpations: Abdomen is soft.  Musculoskeletal: Normal range of motion.  Skin:    General: Skin is warm and dry.  Neurological:     Mental Status: He is alert and oriented to person, place, and time.     Coordination: Coordination normal.  Psychiatric:        Behavior: Behavior normal. Behavior is cooperative.        Thought Content: Thought content normal.        Judgment: Judgment normal.        Patient has been counseled extensively about nutrition and exercise as well as the importance of adherence with medications and regular follow-up. The patient was given clear instructions to go to ER or return to medical center if symptoms don't improve, worsen or new problems develop. The patient verbalized understanding.   Follow-up: Return in about 4 weeks (around 08/29/2019) for luke meter check in 4 weeks then see me in 3 months.   Gildardo Pounds, FNP-BC [facility] and [facility], [place]  08/01/2019, 5:50 PM

## 2019-08-02 LAB — LIPID PANEL
Chol/HDL Ratio: 4.4 ratio (ref 0.0–5.0)
Cholesterol, Total: 148 mg/dL (ref 100–199)
HDL: 34 mg/dL — ABNORMAL LOW (ref >39)
LDL Chol Calc (NIH): 66 mg/dL (ref 0–99)
Triglycerides: 303 mg/dL — ABNORMAL HIGH (ref 0–149)
VLDL Cholesterol Cal: 48 mg/dL — ABNORMAL HIGH (ref 5–40)

## 2019-08-02 LAB — CMP14+EGFR
ALT: 16 IU/L (ref 0–44)
AST: 14 IU/L (ref 0–40)
Albumin/Globulin Ratio: 1.8 (ref 1.2–2.2)
Albumin: 4.6 g/dL (ref 3.8–4.9)
Alkaline Phosphatase: 96 IU/L (ref 39–117)
BUN/Creatinine Ratio: 20 (ref 10–24)
BUN: 21 mg/dL (ref 8–27)
Bilirubin Total: <0.2 mg/dL (ref 0.0–1.2)
CO2: 24 mmol/L (ref 20–29)
Calcium: 9.6 mg/dL (ref 8.6–10.2)
Chloride: 98 mmol/L (ref 96–106)
Creatinine, Ser: 1.07 mg/dL (ref 0.76–1.27)
GFR calc Af Amer: 87 mL/min/{1.73_m2} (ref >59)
GFR calc non Af Amer: 75 mL/min/{1.73_m2} (ref >59)
Globulin, Total: 2.6 g/dL (ref 1.5–4.5)
Glucose: 395 mg/dL — ABNORMAL HIGH (ref 65–99)
Potassium: 4.7 mmol/L (ref 3.5–5.2)
Sodium: 136 mmol/L (ref 134–144)
Total Protein: 7.2 g/dL (ref 6.0–8.5)

## 2019-08-02 LAB — MICROALBUMIN / CREATININE URINE RATIO
Creatinine, Urine: 294.2 mg/dL
Microalb/Creat Ratio: 92 mg/g creat — ABNORMAL HIGH (ref 0–29)
Microalbumin, Urine: 271 ug/mL

## 2019-08-02 LAB — URIC ACID: Uric Acid: 5.3 mg/dL (ref 3.7–8.6)

## 2019-08-02 LAB — TSH: TSH: 3.4 u[IU]/mL (ref 0.450–4.500)

## 2019-08-02 LAB — PSA: Prostate Specific Ag, Serum: 1.3 ng/mL (ref 0.0–4.0)

## 2019-08-05 ENCOUNTER — Encounter: Payer: Self-pay | Admitting: Gastroenterology

## 2019-08-08 ENCOUNTER — Telehealth (INDEPENDENT_AMBULATORY_CARE_PROVIDER_SITE_OTHER): Payer: Self-pay

## 2019-08-08 NOTE — Telephone Encounter (Signed)
Call placed using pacific interpreter [phone]) patient is aware that cholesterol levels are still high. He is aware that he should be taking both cholesterol medications tricor and atorvastatin. Ensure you are taking both. The higher your cholesterol the higher your risk of stroke and heart attack. All other labs normal. He expressed understanding of results. Nat Christen, CMA

## 2019-08-08 NOTE — Telephone Encounter (Signed)
-----   Message from Gildardo Pounds, NP sent at 08/02/2019  9:05 PM EST ----- Cholesterol levels are still high. You have 2 cholesterol medications you should be taking: atorvastatin and tricor. Make sure you are taking both of these medications. The higher your cholesterol levels the higher risk of stroke or heart attack. All of your other labs are essentially normal.

## 2019-08-18 ENCOUNTER — Ambulatory Visit: Payer: Medicaid Other | Admitting: Family Medicine

## 2019-08-25 ENCOUNTER — Encounter: Payer: Self-pay | Admitting: Family Medicine

## 2019-08-25 ENCOUNTER — Other Ambulatory Visit: Payer: Self-pay

## 2019-08-25 ENCOUNTER — Ambulatory Visit (INDEPENDENT_AMBULATORY_CARE_PROVIDER_SITE_OTHER): Payer: Medicaid Other | Admitting: Family Medicine

## 2019-08-25 ENCOUNTER — Ambulatory Visit: Payer: Self-pay

## 2019-08-25 DIAGNOSIS — M79644 Pain in right finger(s): Secondary | ICD-10-CM | POA: Diagnosis not present

## 2019-08-25 DIAGNOSIS — G8929 Other chronic pain: Secondary | ICD-10-CM

## 2019-08-25 DIAGNOSIS — M65311 Trigger thumb, right thumb: Secondary | ICD-10-CM

## 2019-08-25 MED ORDER — DICLOFENAC SODIUM 1 % EX GEL: 4.0000 g | Freq: Four times a day (QID) | CUTANEOUS | 6 refills | Status: DC | PRN

## 2019-08-25 MED ORDER — CELECOXIB 200 MG PO CAPS: 200.0000 mg | ORAL_CAPSULE | Freq: Two times a day (BID) | ORAL | 6 refills | Status: DC | PRN

## 2019-08-25 NOTE — Progress Notes (Signed)
Office Visit Note   Patient: Joe Wallace           Date of Birth: 1961/04/26           MRN: [id number] Visit Date: 08/25/2019 Requested by: Joe Pounds, NP [place],  [place] [address] PCP: Joe Pounds, NP  Subjective: Chief Complaint  Patient presents with  . Right Thumb - Pain    Pain in thumb x 2 months. NKI. Finger gets stuck with flexion sometimes - painful.    HPI: He is here with right thumb pain.  Interpreter was present for the examination.  Symptoms started about 2 months ago with no injury.  His thumb gets stuck in flexion.  He has not tried anything specific to eliminate these symptoms.  There has been no change in his activities, he is not currently working.  He does have a history of diabetes and hypothyroidism.              ROS:   All other systems were reviewed and are negative.  Objective: Vital Signs: There were no vitals taken for this visit.  Physical Exam:  General:  Alert and oriented, in no acute distress. Pulm:  Breathing unlabored. Psy:  Normal mood, congruent affect. Skin: No bruising or erythema. Right thumb: Range of motion normal at the MCP and IP joints but he does have triggering at the A1 pulley and flexion.  There is a tender nodule there as well.  Imaging: X-rays right thumb: Very minimal arthritic change at the MCP and IP joints.  Assessment & Plan: 1.  Right trigger thumb -We discussed various options and elected to try occupational/hand therapy, Voltaren gel topically and Celebrex by mouth as well as a dorsal aluminum splint for the next week or so. -If symptoms persist could inject with cortisone.     Procedures: No procedures performed  No notes on file     PMFS History: Patient Active Problem List   Diagnosis Date Noted  . 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 Alopecia areata 10/11/2017  . 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 Skin tag 10/11/2017  . Neck pain 09/21/2016  . Barbers' rash  06/29/2016  . 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 Diabetic neuropathy ([place]) 03/03/2016  . Neoplasm of uncertain behavior of thyroid gland 08/12/2015  . Adhesive capsulitis of left shoulder 06/07/2015  . Multiple thyroid nodules 12/31/2014  . Left shoulder pain 12/31/2014  . Type 2 diabetes mellitus without complication ([place]) 0000000  . Furunculosis of skin 12/31/2014  . Dyslipidemia 12/31/2014  . Type 2 diabetes mellitus with diabetic polyneuropathy, without long-term current use of insulin ([place]) 02/04/2014  . Knee pain, right 11/18/2013  . Right knee pain 08/18/2013  . Low back pain 08/18/2013   Past Medical History:  Diagnosis Date  . Arthritis   . Depression   . Diabetes mellitus without complication ([place])   . Headache   . Hyperlipidemia   . Memory loss   . Thyroid neoplasm   . Visual disorder    LEFT EYE    Family History  Problem Relation Age of Onset  . Diabetes Mother   . Hypertension Mother   . Hypertension Father   . Diabetes Father     Past Surgical History:  Procedure Laterality Date  . head trauma     L sided fragment from war.  Marland Kitchen SHOULDER ARTHROSCOPY    . THYROIDECTOMY N/A 08/13/2015   Procedure: TOTAL THYROIDECTOMY;  Surgeon: Armandina Gemma, MD;  Location: [facility];  Service: General;  Laterality: N/A;   Social History   Occupational History  . Not on file  Tobacco Use  . Smoking status: Never Smoker  . Smokeless tobacco: Never Used  Substance and Sexual Activity  . Alcohol use: No  . Drug use: No  . Sexual activity: Yes

## 2019-08-27 ENCOUNTER — Other Ambulatory Visit: Payer: Self-pay | Admitting: Family Medicine

## 2019-08-27 DIAGNOSIS — E890 Postprocedural hypothyroidism: Secondary | ICD-10-CM

## 2019-08-28 ENCOUNTER — Other Ambulatory Visit: Payer: Self-pay

## 2019-08-28 ENCOUNTER — Ambulatory Visit (AMBULATORY_SURGERY_CENTER): Payer: Medicaid Other | Admitting: *Deleted

## 2019-08-28 VITALS — Temp 97.1°F | Ht 70.0 in | Wt 225.0 lb

## 2019-08-28 DIAGNOSIS — Z1159 Encounter for screening for other viral diseases: Secondary | ICD-10-CM

## 2019-08-28 DIAGNOSIS — Z1211 Encounter for screening for malignant neoplasm of colon: Secondary | ICD-10-CM

## 2019-08-28 MED ORDER — SUPREP BOWEL PREP KIT 17.5-3.13-1.6 GM/177ML PO SOLN: 1.0000 | Freq: Once | ORAL | 0 refills | Status: AC

## 2019-08-28 NOTE — Progress Notes (Signed)
Interpreter in Pv - his temp 97.5- pt's son who speaks English in [place] to help with instructions at home- his temp 97.3   No egg or soy allergy known to patient  No issues with past sedation with any surgeries  or procedures, no intubation problems  No diet pills per patient No home 02 use per patient  No blood thinners per patient  Pt denies issues with constipation  No A fib or A flutter  EMMI video sent to pt's e mail   Due to the COVID-19 pandemic we are asking patients to follow these guidelines. Please only bring one care partner. Please be aware that your care partner may wait in the car in the parking lot or if they feel like they will be too hot to wait in the car, they may wait in the lobby on the 4th floor. All care partners are required to wear a mask the entire time (we do not have any that we can provide them), they need to practice social distancing, and we will do a Covid check for all patient's and care partners when you arrive. Also we will check their temperature and your temperature. If the care partner waits in their car they need to stay in the parking lot the entire time and we will call them on their cell phone when the patient is ready for discharge so they can bring the car to the front of the building. Also all patient's will need to wear a mask into building.

## 2019-08-29 ENCOUNTER — Ambulatory Visit: Payer: Medicaid Other | Admitting: Pharmacist

## 2019-09-01 ENCOUNTER — Encounter: Payer: Self-pay | Admitting: Nurse Practitioner

## 2019-09-01 ENCOUNTER — Ambulatory Visit: Payer: Medicaid Other | Attending: Nurse Practitioner | Admitting: Nurse Practitioner

## 2019-09-04 ENCOUNTER — Encounter: Payer: Self-pay | Admitting: Registered"

## 2019-09-04 ENCOUNTER — Encounter: Payer: Medicaid Other | Attending: Nurse Practitioner | Admitting: Registered"

## 2019-09-04 ENCOUNTER — Other Ambulatory Visit: Payer: Self-pay

## 2019-09-04 DIAGNOSIS — E119 Type 2 diabetes mellitus without complications: Secondary | ICD-10-CM | POA: Insufficient documentation

## 2019-09-04 NOTE — Progress Notes (Signed)
Diabetes Self-Management Education  Visit Type: First/Initial  Appt. Start Time: 1405 Appt. End Time: 1500  09/04/2019  [name], identified by name and date of birth, is a 58 y.o. male with a diagnosis of Diabetes: Type 2.   ASSESSMENT  There were no vitals taken for this visit. There is no height or weight on file to calculate BMI.   Patient is here with his son. Used [employer] interpreter.  Pt states he has a memory problem - didn't pass the citizen test. Asked for help filling out forms, interpreter stated that he needs an in-person interpreter. RD stated cannot help with this request and suggested he ask his PCP for help in this matter.   Pt states he did not want to take insulin and instead made changes to bring down BG. Pt states his fasting number was 250-270 mg/dL and now is 150-160. Pt states he is walking 45 min daily on the treadmill. Pt states he skips dinner to help lose weight, he may have fruit or juice.   SMBG: 150-160 FBG; sometimes 84-94; also checks in ~10 pm 160-180 mg/dL after not eating much since lunch time. Patient denies low blood sugar symptoms. Pt states his doctor has a target goal of ~ 120 mg/dL  RD used food models to help patient understand balanced meals.  Diabetes Self-Management Education - 09/04/19 1412      Visit Information   Visit Type  First/Initial      Initial Visit   Diabetes Type  Type 2    Are you currently following a meal plan?  No    Are you taking your medications as prescribed?  Yes      Complications   Last HgB A1C per patient/outside source  10.3 %   Nov 6   How often do you check your blood sugar?  1-2 times/day    Fasting Blood glucose range (mg/dL)  130-179   150-160   Number of hypoglycemic episodes per month  0   84 lowest reported     Dietary Intake   Breakfast  oatmeal, water, 1 apple    Lunch  BBQ chicken whole grain bread,    Dinner  fruit with medication    Beverage(s)  water      Exercise   Exercise Type  Light (walking / raking leaves)    How many days per week to you exercise?  7    How many minutes per day do you exercise?  45    Total minutes per week of exercise  315      Patient Education   Nutrition management   Role of diet in the treatment of diabetes and the relationship between the three main macronutrients and blood glucose level      Individualized Goals (developed by patient)   Nutrition  General guidelines for healthy choices and portions discussed    Physical Activity  Exercise 5-7 days per week      Outcomes   Expected Outcomes  Demonstrated interest in learning. Expect positive outcomes    Future DMSE  2 months    Program Status  Completed       Individualized Plan for Diabetes Self-Management Training:   Learning Objective:  Patient will have a greater understanding of diabetes self-management. Patient education plan is to attend individual and/or group sessions per assessed needs and concerns.   Plan:  Continue with exercise, aim to eat balanced meals, add protein to breakfast and consider not skipping dinner.  Expected Outcomes:  Demonstrated interest in learning. Expect positive outcomes  Education material provided: Carb sheet and meal plan from [employer] in Arabic.  If problems or questions, patient to contact team via:  Phone  Future DSME appointment: 2 months

## 2019-09-05 ENCOUNTER — Ambulatory Visit: Payer: Medicaid Other | Attending: Nurse Practitioner | Admitting: Pharmacist

## 2019-09-05 ENCOUNTER — Other Ambulatory Visit: Payer: Self-pay

## 2019-09-05 DIAGNOSIS — E1140 Type 2 diabetes mellitus with diabetic neuropathy, unspecified: Secondary | ICD-10-CM | POA: Insufficient documentation

## 2019-09-05 DIAGNOSIS — E1142 Type 2 diabetes mellitus with diabetic polyneuropathy: Secondary | ICD-10-CM | POA: Diagnosis not present

## 2019-09-05 DIAGNOSIS — Z7984 Long term (current) use of oral hypoglycemic drugs: Secondary | ICD-10-CM | POA: Diagnosis not present

## 2019-09-05 DIAGNOSIS — Z7189 Other specified counseling: Secondary | ICD-10-CM | POA: Diagnosis not present

## 2019-09-05 DIAGNOSIS — Z79899 Other long term (current) drug therapy: Secondary | ICD-10-CM | POA: Diagnosis not present

## 2019-09-05 DIAGNOSIS — E1165 Type 2 diabetes mellitus with hyperglycemia: Secondary | ICD-10-CM | POA: Diagnosis not present

## 2019-09-05 MED ORDER — ACCU-CHEK GUIDE ME W/DEVICE KIT: 1.0000 | PACK | Freq: Two times a day (BID) | 0 refills | Status: DC

## 2019-09-05 MED ORDER — ACCU-CHEK GUIDE VI STRP: ORAL_STRIP | 11 refills | Status: DC

## 2019-09-05 MED ORDER — ACCU-CHEK FASTCLIX LANCETS MISC: 11 refills | Status: DC

## 2019-09-05 NOTE — Progress Notes (Signed)
    S:    PCP: Zelda   No chief complaint on file.  Patient arrives in good spirits.  Presents for diabetes evaluation, education, and management Patient was referred and last seen by Primary Care Provider on 08/01/19.    Patient reports Diabetes was diagnosed in 2015 or 2016.   Family/Social History:  - FHx: DM, HTN - Tobacco: never smoker  - Alcohol: denies use  Insurance coverage/medication affordability: [place] Medicaid   Patient denies adherence with medications.  Current diabetes medications include: glipizide 10 mg XL, two tablets (20 mg total) daily; metformin 1000 mg BID - pt does not take insulin  Current hypertension medications include: none  Current hyperlipidemia medications include: atorvastatin 40 mg daily  Patient denies hypoglycemic events.  Patient reported dietary habits:  - Recently was seen by a dietician  - Reports improving his diet  Patient-reported exercise habits:  - Exercises daily    Patient denies nocturia. Patient reports baseline neuropathy.  Patient denies visual changes. Patient reports self foot exams.    O:   Lab Results  Component Value Date   HGBA1C 10.3 (A) 08/01/2019   There were no vitals filed for this visit.  Lipid Panel     Component Value Date/Time   CHOL 148 08/01/2019 1714   TRIG 303 (H) 08/01/2019 1714   HDL 34 (L) 08/01/2019 1714   CHOLHDL 4.4 08/01/2019 1714   CHOLHDL 7.1 (H) 06/29/2016 1211   VLDL 56 (H) 06/29/2016 1211   LDLCALC 66 08/01/2019 1714   Home fasting blood sugars: 90 - 130s  2 hour post-meal/random blood sugars: 120 - 170s. Patient does not have his meter; home readings are reported.   Clinical Atherosclerotic Cardiovascular Disease (ASCVD): No  The 10-year ASCVD risk score Mikey Bussing DC Jr., et al., 2013) is: 14%   Values used to calculate the score:     Age: 58 years     Sex: Male     Is Non-Hispanic African American: No     Diabetic: Yes     Tobacco smoker: No     Systolic Blood Pressure: 123456  mmHg     Is BP treated: No     HDL Cholesterol: 34 mg/dL     Total Cholesterol: 148 mg/dL   A/P: Diabetes longstanding currently uncontrolled but his reported home sugars are good. Patient is able to verbalize appropriate hypoglycemia management plan. Patient is adherent with oral medication but is not taking his insulin. He reports drastically changing his lifestyle, including his exercise and eating habits. I told him that he may stay off insulin for now, but if his A1c does not come down appropriately, we will have to add insulin.   -Continued metformin and glipizide.  - Hold Lantus for now -Extensively discussed pathophysiology of diabetes, recommended lifestyle interventions, dietary effects on blood sugar control -Counseled on s/sx of and management of hypoglycemia -Next A1C anticipated 10/2019.   ASCVD risk - primary prevention in patient with diabetes. Last LDL is controlled. Continue current statin. -Continued atorvastatin 40 mg.   HM: tetanus shot due but patient deferred.   Written patient instructions provided. Total time in face to face counseling 30 minutes.   Follow up PCP Clinic Visit in Feb.   Benard Halsted, PharmD, [place] [phone]

## 2019-09-06 ENCOUNTER — Other Ambulatory Visit: Payer: Self-pay | Admitting: Nurse Practitioner

## 2019-09-06 MED ORDER — VITAMIN D (ERGOCALCIFEROL) 1.25 MG (50000 UNIT) PO CAPS: 50000.0000 [IU] | ORAL_CAPSULE | ORAL | 1 refills | Status: DC

## 2019-09-09 ENCOUNTER — Telehealth: Payer: Self-pay | Admitting: Nurse Practitioner

## 2019-09-09 NOTE — Telephone Encounter (Signed)
Patient called with the right number  [phone]

## 2019-09-11 ENCOUNTER — Encounter: Payer: Medicaid Other | Admitting: Gastroenterology

## 2019-09-11 NOTE — Telephone Encounter (Signed)
Patient called about paperwork 

## 2019-09-14 NOTE — Telephone Encounter (Signed)
After speaking with his attorney there is not much more than I can do. It appears the paperwork has been filled our correctly.

## 2019-09-15 NOTE — Telephone Encounter (Signed)
Pt called to request a copy of his paperwork , please follow up when this is ready

## 2019-09-16 NOTE — Telephone Encounter (Signed)
Patient dropped off a copy of the wife's immigration paperwork to be compared for his completed paperwork

## 2019-09-16 NOTE — Telephone Encounter (Signed)
Please call pt back.

## 2019-09-17 NOTE — Telephone Encounter (Signed)
Pt. Was informed that the PCP will fill out the application again and CMA will contact patient when it is ready.

## 2019-10-02 NOTE — Telephone Encounter (Signed)
Patient called asking for the paperwork to have a call back.

## 2019-10-06 NOTE — Telephone Encounter (Signed)
Spoke to patient and informed his paper work is ready for him to pick up at the front desk.  Pt. Understood.

## 2019-11-04 ENCOUNTER — Ambulatory Visit: Payer: Medicaid Other | Admitting: Registered"

## 2019-11-06 ENCOUNTER — Ambulatory Visit: Payer: Medicaid Other | Attending: Nurse Practitioner | Admitting: Nurse Practitioner

## 2019-11-06 ENCOUNTER — Encounter: Payer: Self-pay | Admitting: Nurse Practitioner

## 2019-11-06 ENCOUNTER — Other Ambulatory Visit: Payer: Self-pay

## 2019-11-06 VITALS — BP 141/86 | HR 81 | Temp 97.1°F | Ht 70.0 in | Wt 239.0 lb

## 2019-11-06 DIAGNOSIS — M546 Pain in thoracic spine: Secondary | ICD-10-CM | POA: Diagnosis not present

## 2019-11-06 DIAGNOSIS — E1142 Type 2 diabetes mellitus with diabetic polyneuropathy: Secondary | ICD-10-CM | POA: Insufficient documentation

## 2019-11-06 DIAGNOSIS — Z130 Encounter for screening for diseases of the blood and blood-forming organs and certain disorders involving the immune mechanism: Secondary | ICD-10-CM

## 2019-11-06 DIAGNOSIS — Z79899 Other long term (current) drug therapy: Secondary | ICD-10-CM | POA: Diagnosis not present

## 2019-11-06 DIAGNOSIS — Z7982 Long term (current) use of aspirin: Secondary | ICD-10-CM | POA: Insufficient documentation

## 2019-11-06 DIAGNOSIS — F329 Major depressive disorder, single episode, unspecified: Secondary | ICD-10-CM | POA: Insufficient documentation

## 2019-11-06 DIAGNOSIS — Z791 Long term (current) use of non-steroidal anti-inflammatories (NSAID): Secondary | ICD-10-CM | POA: Insufficient documentation

## 2019-11-06 DIAGNOSIS — E785 Hyperlipidemia, unspecified: Secondary | ICD-10-CM | POA: Diagnosis not present

## 2019-11-06 DIAGNOSIS — Z794 Long term (current) use of insulin: Secondary | ICD-10-CM | POA: Diagnosis not present

## 2019-11-06 DIAGNOSIS — Z833 Family history of diabetes mellitus: Secondary | ICD-10-CM | POA: Diagnosis not present

## 2019-11-06 DIAGNOSIS — G8929 Other chronic pain: Secondary | ICD-10-CM

## 2019-11-06 DIAGNOSIS — Z1211 Encounter for screening for malignant neoplasm of colon: Secondary | ICD-10-CM

## 2019-11-06 DIAGNOSIS — M1990 Unspecified osteoarthritis, unspecified site: Secondary | ICD-10-CM | POA: Diagnosis not present

## 2019-11-06 LAB — GLUCOSE, POCT (MANUAL RESULT ENTRY)
POC Glucose: 63 mg/dl — AB (ref 70–99)
POC Glucose: 89 mg/dl (ref 70–99)

## 2019-11-06 LAB — POCT GLYCOSYLATED HEMOGLOBIN (HGB A1C): Hemoglobin A1C: 7.8 % — AB (ref 4.0–5.6)

## 2019-11-06 LAB — POCT URINALYSIS DIP (CLINITEK)
Bilirubin, UA: NEGATIVE
Blood, UA: NEGATIVE
Glucose, UA: NEGATIVE mg/dL
Ketones, POC UA: NEGATIVE mg/dL
Leukocytes, UA: NEGATIVE
Nitrite, UA: NEGATIVE
POC PROTEIN,UA: 100 — AB
Spec Grav, UA: 1.02 (ref 1.010–1.025)
Urobilinogen, UA: 0.2 E.U./dL
pH, UA: 6.5 (ref 5.0–8.0)

## 2019-11-06 MED ORDER — CYCLOBENZAPRINE HCL 5 MG PO TABS: 5.0000 mg | ORAL_TABLET | Freq: Three times a day (TID) | ORAL | 1 refills | Status: DC | PRN

## 2019-11-06 MED ORDER — ATORVASTATIN CALCIUM 40 MG PO TABS: 40.0000 mg | ORAL_TABLET | Freq: Every day | ORAL | 3 refills | Status: DC

## 2019-11-06 NOTE — Progress Notes (Signed)
Assessment & Plan:  Joe Wallace was seen today for follow-up.  Diagnoses and all orders for this visit:  Type 2 diabetes mellitus with diabetic polyneuropathy, without long-term current use of insulin (HCC) -     Glucose (CBG) -     HgB A1c -     Basic metabolic panel -     Glucose (CBG) Continue blood sugar control as discussed in office today, low carbohydrate diet, and regular physical exercise as tolerated, 150 minutes per week (30 min each day, 5 days per week, or 50 min 3 days per week). Keep blood sugar logs with fasting goal of 90-130 mg/dl, post prandial (after you eat) less than 180.  For Hypoglycemia: BS <60 and Hyperglycemia BS >400; contact the clinic ASAP. Annual eye exams and foot exams are recommended.  Dyslipidemia -     atorvastatin (LIPITOR) 40 MG tablet; Take 1 tablet (40 mg total) by mouth daily. INSTRUCTIONS: Work on a low fat, heart healthy diet and participate in regular aerobic exercise program by working out at least 150 minutes per week; 5 days a week-30 minutes per day. Avoid red meat/beef/steak,  fried foods. junk foods, sodas, sugary drinks, unhealthy snacking, alcohol and smoking.  Drink at least 80 oz of water per day and monitor your carbohydrate intake daily.    Chronic right-sided thoracic back pain -     cyclobenzaprine (FLEXERIL) 5 MG tablet; Take 1 tablet (5 mg total) by mouth 3 (three) times daily as needed for muscle spasms. -     POCT URINALYSIS DIP (CLINITEK) Work on losing weight to help reduce back pain. May alternate with heat and ice application for pain relief. May also alternate with acetaminophen and Ibuprofen as prescribed for back pain. Other alternatives include massage, acupuncture and water aerobics.  You must stay active and avoid a sedentary lifestyle.    Colon cancer screening -     Fecal occult blood, imunochemical([facility])  Screening for deficiency anemia -     CBC    Patient has been counseled on age-appropriate  routine health concerns for screening and prevention. These are reviewed and up-to-date. Referrals have been placed accordingly. Immunizations are up-to-date or declined.    Subjective:   Chief Complaint  Patient presents with  . Follow-up    Pt. is here for diabetes follow up. Pt. have right side pain.    HPI Joe Wallace 59 y.o. male presents to office today for follow up.  has a past medical history of Arthritis, Depression, Diabetes mellitus without complication ([place]), Headache, Hyperlipidemia, Memory loss, Thyroid neoplasm, and Visual disorder.   DM TYPE 2 Improved! Down from 10 to 7.8. He has made changes with his dietary intake. Low carb and low fat. Endorses medication compliance taking metformin 1000 mg BID and glipizide 20 mg daily.  Lab Results  Component Value Date   HGBA1C 7.8 (A) 11/06/2019   Lab Results  Component Value Date   HGBA1C 10.3 (A) 08/01/2019   Dyslipidemia LDL at goal of <70. Taking atorvastatin 40 mg daily and tricor 145 mg daily. Denies any statin intolerance.  Lab Results  Component Value Date   LDLCALC 66 08/01/2019   Chronic right sided thoracic back pain Onset several months ago. Aggravating factors: prolonged sitting and standing. Relieving factors: stretching. Denies dysuria, bowel or bladder incontinence   Review of Systems  Constitutional: Negative for fever, malaise/fatigue and weight loss.  HENT: Negative.  Negative for nosebleeds.   Eyes: Negative.  Negative for blurred vision,  double vision and photophobia.  Respiratory: Negative.  Negative for cough and shortness of breath.   Cardiovascular: Negative.  Negative for chest pain, palpitations and leg swelling.  Gastrointestinal: Negative.  Negative for heartburn, nausea and vomiting.  Musculoskeletal: Positive for back pain and joint pain. Negative for myalgias.  Neurological: Negative.  Negative for dizziness, focal weakness, seizures and headaches.  Psychiatric/Behavioral:  Negative.  Negative for suicidal ideas.    Past Medical History:  Diagnosis Date  . Arthritis   . Depression   . Diabetes mellitus without complication ([place])   . Headache   . Hyperlipidemia   . Memory loss   . Thyroid neoplasm   . Visual disorder    LEFT EYE    Past Surgical History:  Procedure Laterality Date  . head trauma     L sided fragment from war.  Marland Kitchen SHOULDER ARTHROSCOPY    . THYROIDECTOMY N/A 08/13/2015   Procedure: TOTAL THYROIDECTOMY;  Surgeon: Armandina Gemma, MD;  Location: [facility];  Service: General;  Laterality: N/A;    Family History  Problem Relation Age of Onset  . Diabetes Mother   . Hypertension Mother   . Hypertension Father   . Diabetes Father   . Colon cancer Neg Hx   . Colon polyps Neg Hx   . Esophageal cancer Neg Hx   . Rectal cancer Neg Hx   . Stomach cancer Neg Hx     Social History Reviewed with no changes to be made today.   Outpatient Medications Prior to Visit  Medication Sig Dispense Refill  . Accu-Chek FastClix Lancets MISC Use to check blood sugar twice daily. E11.42 102 each 11  . acetaminophen (TYLENOL) 500 MG tablet Take 500 mg by mouth every 8 (eight) hours as needed.    Marland Kitchen aspirin 81 MG tablet Take 81 mg by mouth daily.    . Blood Glucose Monitoring Suppl (ACCU-CHEK GUIDE ME) w/Device KIT 1 kit by Does not apply route 2 (two) times daily. Use to check blood sugar twice daily. E11.42 1 kit 0  . celecoxib (CELEBREX) 200 MG capsule Take 1 capsule (200 mg total) by mouth 2 (two) times daily as needed. 60 capsule 6  . diclofenac Sodium (VOLTAREN) 1 % GEL Apply 4 g topically 4 (four) times daily as needed. 500 g 6  . DULoxetine (CYMBALTA) 30 MG capsule Take 1 capsule (30 mg total) by mouth daily. 90 capsule 1  . EUTHYROX 125 MCG tablet Take 1 tablet by mouth once daily (Patient taking differently: Thyroid) 30 tablet 2  . glipiZIDE (GLUCOTROL XL) 10 MG 24 hr tablet TAKE 2 TABLETS BY MOUTH ONCE DAILY WITH BREAKFAST 180 tablet 1  . glucose  blood (ACCU-CHEK GUIDE) test strip Use to check blood sugar twice daily. E11.42 100 each 11  . Insulin Pen Needle (B-D UF III MINI PEN NEEDLES) 31G X 5 MM MISC Use as instructed. Inject into the skin once nightly. 100 each 6  . metFORMIN (GLUCOPHAGE) 1000 MG tablet Take 1 tablet (1,000 mg total) by mouth 2 (two) times daily with a meal. 180 tablet 3  . fenofibrate (TRICOR) 145 MG tablet Take 1 tablet (145 mg total) by mouth daily. (Patient not taking: Reported on 08/28/2019) 90 tablet 1  . Vitamin D, Ergocalciferol, (DRISDOL) 1.25 MG (50000 UT) CAPS capsule Take 1 capsule (50,000 Units total) by mouth every 7 (seven) days. (Patient not taking: Reported on 11/06/2019) 12 capsule 1  . atorvastatin (LIPITOR) 40 MG tablet Take 1 tablet (40  mg total) by mouth daily. (Patient not taking: Reported on 11/06/2019) 90 tablet 3  . Insulin Glargine (LANTUS) 100 UNIT/ML Solostar Pen Inject 20 Units into the skin daily at 10 pm. (Patient not taking: Reported on 08/28/2019) 15 mL 11   No facility-administered medications prior to visit.    No Known Allergies     Objective:    BP (!) 141/86 (BP Location: Right Arm, Patient Position: Sitting, Cuff Size: Large)   Pulse 81   Temp (!) 97.1 F (36.2 C) (Temporal)   Ht '5\' 10"'$  (1.778 m)   Wt 239 lb (108.4 kg)   SpO2 97%   BMI 34.29 kg/m  Wt Readings from Last 3 Encounters:  11/06/19 239 lb (108.4 kg)  08/28/19 225 lb (102.1 kg)  08/01/19 234 lb 6.4 oz (106.3 kg)    Physical Exam Vitals and nursing note reviewed.  Constitutional:      Appearance: He is well-developed.  HENT:     Head: Normocephalic and atraumatic.  Cardiovascular:     Rate and Rhythm: Normal rate and regular rhythm.     Heart sounds: Normal heart sounds. No murmur. No friction rub. No gallop.   Pulmonary:     Effort: Pulmonary effort is normal. No tachypnea or respiratory distress.     Breath sounds: Normal breath sounds. No decreased breath sounds, wheezing, rhonchi or rales.  Chest:      Chest wall: No tenderness.  Abdominal:     General: Bowel sounds are normal.     Palpations: Abdomen is soft.     Tenderness: There is no right CVA tenderness or left CVA tenderness.  Musculoskeletal:        General: Normal range of motion.     Cervical back: Normal range of motion.     Lumbar back: Negative right straight leg raise test and negative left straight leg raise test.  Skin:    General: Skin is warm and dry.  Neurological:     Mental Status: He is alert and oriented to person, place, and time.     Coordination: Coordination normal.  Psychiatric:        Behavior: Behavior normal. Behavior is cooperative.        Thought Content: Thought content normal.        Judgment: Judgment normal.          Patient has been counseled extensively about nutrition and exercise as well as the importance of adherence with medications and regular follow-up. The patient was given clear instructions to go to ER or return to medical center if symptoms don't improve, worsen or new problems develop. The patient verbalized understanding.   Follow-up: Return in about 3 months (around 02/03/2020).   Gildardo Pounds, FNP-BC [facility] and [facility] [place], [place]

## 2019-11-07 ENCOUNTER — Ambulatory Visit: Payer: Medicaid Other | Admitting: Nurse Practitioner

## 2019-11-07 LAB — BASIC METABOLIC PANEL
BUN/Creatinine Ratio: 15 (ref 10–24)
BUN: 18 mg/dL (ref 8–27)
CO2: 25 mmol/L (ref 20–29)
Calcium: 9.8 mg/dL (ref 8.6–10.2)
Chloride: 101 mmol/L (ref 96–106)
Creatinine, Ser: 1.22 mg/dL (ref 0.76–1.27)
GFR calc Af Amer: 74 mL/min/{1.73_m2} (ref >59)
GFR calc non Af Amer: 64 mL/min/{1.73_m2} (ref >59)
Glucose: 91 mg/dL (ref 65–99)
Potassium: 4.4 mmol/L (ref 3.5–5.2)
Sodium: 141 mmol/L (ref 134–144)

## 2019-11-07 LAB — CBC
Hematocrit: 42.2 % (ref 37.5–51.0)
Hemoglobin: 13.3 g/dL (ref 13.0–17.7)
MCH: 26 pg — ABNORMAL LOW (ref 26.6–33.0)
MCHC: 31.5 g/dL (ref 31.5–35.7)
MCV: 82 fL (ref 79–97)
Platelets: 301 10*3/uL (ref 150–450)
RBC: 5.12 x10E6/uL (ref 4.14–5.80)
RDW: 12.7 % (ref 11.6–15.4)
WBC: 9.4 10*3/uL (ref 3.4–10.8)

## 2019-11-16 ENCOUNTER — Encounter: Payer: Self-pay | Admitting: Nurse Practitioner

## 2019-11-20 DIAGNOSIS — Z1211 Encounter for screening for malignant neoplasm of colon: Secondary | ICD-10-CM | POA: Diagnosis not present

## 2019-11-21 LAB — FECAL OCCULT BLOOD, IMMUNOCHEMICAL: Fecal Occult Bld: NEGATIVE

## 2019-11-25 ENCOUNTER — Other Ambulatory Visit: Payer: Self-pay | Admitting: Family Medicine

## 2019-11-25 DIAGNOSIS — E890 Postprocedural hypothyroidism: Secondary | ICD-10-CM

## 2019-12-04 ENCOUNTER — Ambulatory Visit (INDEPENDENT_AMBULATORY_CARE_PROVIDER_SITE_OTHER): Payer: Medicaid Other | Admitting: Family Medicine

## 2019-12-04 ENCOUNTER — Encounter: Payer: Self-pay | Admitting: Family Medicine

## 2019-12-04 ENCOUNTER — Other Ambulatory Visit: Payer: Self-pay

## 2019-12-04 DIAGNOSIS — M65311 Trigger thumb, right thumb: Secondary | ICD-10-CM

## 2019-12-04 DIAGNOSIS — M542 Cervicalgia: Secondary | ICD-10-CM

## 2019-12-04 NOTE — Progress Notes (Signed)
    SUBJECTIVE:  CHIEF COMPLAINT / HPI:  Thumb Pain  Right hand dominant. Around 4 months ago and 2 months ago, came in and had meds and x ray.  Caused by lymph nodes and diabetes. Before he didn't have much motion, now better, but still pain. Reports that finger used to get stuck. The pain is still present, but less than before.  Patient continues to use celebrex morning and evening and uses voltaren gel 3-4 times a day. Pain still continues to keep him from doing things every day and he is interested in continuing treatment. Feels better with warm shower. Is also exercising and is improving.   Neck Right sided neck pain that started 3 weeks ago. Doesn't know what caused it. Was still in the morning when waking up. Wife massaged with cream and helped a little, but continues to be stiff.  Has very limited ROM on that side since. Feels like pulled muscle and feels stiff. No trauma or injuries in the past. Denies numbness, tingling, weakness.   PERTINENT  PMH / PSH: DM  OBJECTIVE:   There were no vitals taken for this visit.  Neck: No gross abnormalities on inspection. TTP inferior aspect of mastoid processes and along path of SCM. Spasm appreciated with palpation. FROM of neck with pain elicited with rotation to the right. No UE weakness. 5/5 strength with neck rotation and lateral flexion bilaterally.  Right thumb: no abnormalities on inspection. No clicking with palpation during flexion. TTP MCP joint circumferentially with mild swelling on dorsal surface. Pain with axial loading to MCP joint. Full ROM. 5/5 strength bilaterally.     ASSESSMENT/PLAN:   1. Trigger Finger  Declining kidney function thus will d/c celebrex. Referral to PT for iontophoresis. Can continue topical NSAIDS  2. Neck Pain  Can use topical NSAIDs for pain. Referral to PT. RTC PRN.   Wilber Oliphant, MD [place]

## 2019-12-04 NOTE — Progress Notes (Signed)
I saw and examined the patient with Dr. Maudie Mercury and agree with assessment and plan as outlined.    Myofascial neck pain, right-sided.  Will treat with PT.  Improved but persistent left thumb pain at MCP joint.  Not triggering today.  Will try ionto in PT.  Inject if pain persists.

## 2019-12-08 ENCOUNTER — Other Ambulatory Visit: Payer: Self-pay

## 2019-12-08 ENCOUNTER — Encounter: Payer: Self-pay | Admitting: Physical Therapy

## 2019-12-08 ENCOUNTER — Ambulatory Visit: Payer: Medicaid Other | Attending: Family Medicine | Admitting: Physical Therapy

## 2019-12-08 DIAGNOSIS — M62838 Other muscle spasm: Secondary | ICD-10-CM | POA: Diagnosis not present

## 2019-12-08 DIAGNOSIS — M25541 Pain in joints of right hand: Secondary | ICD-10-CM

## 2019-12-08 DIAGNOSIS — M542 Cervicalgia: Secondary | ICD-10-CM

## 2019-12-08 NOTE — Therapy (Signed)
[place], [place], [address] Phone: [phone]   Fax:  [phone]  Physical Therapy Evaluation  Patient Details  Name: Joe Wallace MRN: [id number] Date of Birth: 17-Jul-1961 Referring Provider (PT): Eunice Blase, MD    Encounter Date: 12/08/2019  PT End of Session - 12/08/19 1334    Visit Number  1    Number of Visits  13    Date for PT Re-Evaluation  02/02/20    Authorization Type  MCD- resubmit at 4th visit.    PT Start Time  1334    PT Stop Time  1411    PT Time Calculation (min)  37 min    Activity Tolerance  Patient tolerated treatment well    Behavior During Therapy  WFL for tasks assessed/performed       Past Medical History:  Diagnosis Date  . Arthritis   . 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 Depression   . Diabetes mellitus without complication ([place])   . Headache   . Hyperlipidemia   . Memory loss   . Thyroid neoplasm   . Visual disorder    LEFT EYE    Past Surgical History:  Procedure Laterality Date  . head trauma     L sided fragment from war.  Marland Kitchen SHOULDER ARTHROSCOPY    . THYROIDECTOMY N/A 08/13/2015   Procedure: TOTAL THYROIDECTOMY;  Surgeon: Armandina Gemma, MD;  Location: [facility];  Service: General;  Laterality: N/A;    There were no vitals filed for this visit.   Subjective Assessment - 12/08/19 1341    Subjective  pt is a 59 y.o with CC of r sided neck pain that started about 1 month ago stating it started with no MOI. since onset the pain is improving, I've been doing some exercises which help. He denies any referral down the arm. pt reports having issues with the R thumb, that has been going on for about 2 months ago with no MOI. The MD gave medication and it helped.    How long can you sit comfortably?  unlimted    How long can you stand comfortably?  unlimited    How long can you walk comfortably?  unlimited    Diagnostic tests  06/18/2018 CT scan  IMPRESSION:Negative head CT.    Patient Stated Goals  decreease the pain    Currently in Pain?  Yes    Pain Score  5     Pain Orientation  Right    Pain Descriptors / Indicators  Aching;Tightness    Pain Type  Chronic pain    Pain Onset  More than a month ago    Pain Frequency  Intermittent    Aggravating Factors   looking to the L/R and up/a down    Pain Relieving Factors  medication, self exercises    Multiple Pain Sites  Yes    Pain Score  3    Pain Location  Hand    Pain Orientation  Right   thumb dorsum   Pain Descriptors / Indicators  Aching;Tightness    Pain Type  Chronic pain    Pain Frequency  Intermittent    Aggravating Factors   movement    Pain Relieving Factors  medication         [facility] PT Assessment - 12/08/19 1346      Assessment   Medical Diagnosis  Trigger thumb, right thumb M65.311, Neck pain M54.2  Referring Provider (PT)  Hilts, Legrand Como, MD     Onset Date/Surgical Date  --   1 month ago for neck, thumb 2 montths   Hand Dominance  Right    Next MD Visit  2 months    Prior Therapy  no      Precautions   Precautions  None      Restrictions   Weight Bearing Restrictions  No      Balance Screen   Has the patient fallen in the past 6 months  No    Has the patient had a decrease in activity level because of a fear of falling?   No    Is the patient reluctant to leave their home because of a fear of falling?   No      Home Film/video editor residence      Prior Function   Level of Independence  Independent    Vocation  On disability      Cognition   Overall Cognitive Status  Within Functional Limits for tasks assessed      ROM / Strength   AROM / PROM / Strength  AROM;PROM;Strength      AROM   Overall AROM Comments  thumb ROM WFL compared bil    AROM Assessment Site  Cervical;Thumb    Right/Left Thumb  Right    Cervical Flexion  60    Cervical Extension  50   concordant pain at end of Rnage   Cervical - Right  Side Bend  22   pain along ipsilateral side   Cervical - Left Side Bend  22   slight end range stiffness   Cervical - Right Rotation  71    Cervical - Left Rotation  73      Strength   Overall Strength  Within functional limits for tasks performed    Strength Assessment Site  Hand      Palpation   Palpation comment  TTP along C2-C4 R facet multiple trigger points along the  Rupper trap/ levator scapulae                Objective measurements completed on examination: See above findings.                PT Short Term Goals - 12/08/19 1417      PT SHORT TERM GOAL #1   Title  pt to be I with inital HEP    Baseline  no previous HEp    Time  3    Period  Weeks    Status  New    Target Date  01/05/20      PT SHORT TERM GOAL #2   Title  pt to report decreased tightness in the  Rupper trap and surrounding musculature to reduce R sided neck pain to </= 4/10    Baseline  increased tension in the R upper trap/ levator scapulae rated at 4-5/ 10    Time  3    Period  Weeks    Status  New    Target Date  01/05/20        PT Long Term Goals - 12/08/19 1418      PT LONG TERM GOAL #1   Title  pt to maintain cervical ROM with </= 1/10 pain in all planes for improvement in condition    Baseline  4-5/10 end range R sidebending/extension    Time  6    Period  Weeks  Status  New    Target Date  02/02/20      PT LONG TERM GOAL #2   Title  pt to verbalize/ demo proper posture to reduce and prevent neck pain    Baseline  no knowledge of efficient posture    Time  6    Period  Weeks    Status  New    Target Date  02/02/20      PT LONG TERM GOAL #3   Title  pt to report </= 2/10 maxpain in the R thumb for QOL    Baseline  any activity causes 4-5/10 pain    Time  6    Period  Weeks    Status  New    Target Date  02/02/20      PT LONG TERM GOAL #4   Title  pt to be I with all HEP given as of last visit to maintain and progress current level of function     Baseline  no previous HEP    Time  6    Period  Weeks    Status  New    Target Date  02/02/20             Plan - 12/08/19 1411    Clinical Impression Statement  pt presents to OPPT with CC of neck pain and hx or R thumb pain. pt demonstrates functional cervical ROM with pain noted with r rotation looking to the R and and end range extension. TTP long C2-C4 R facets and trigger points in the R upper trap/ levator scapuale. and R 1st MCP tenderness. pt exhibits potential facet aggirvation on the right based on asssessment combined with muslce spasm. He would benefit from physical therapy to decrease neck pain, improve ROM, reduce thumb pain and maximize function by addressing the deficits listed.    Personal Factors and Comorbidities  Comorbidity 2    Comorbidities  hx of thorat Cx, depression, DM    Stability/Clinical Decision Making  Evolving/Moderate complexity    Clinical Decision Making  Moderate    Rehab Potential  Good    PT Frequency  2x / week    PT Duration  6 weeks   inital auth 1 x a week for 3 weeks.   PT Treatment/Interventions  ADLs/Self Care Home Management;Cryotherapy;Electrical Stimulation;Iontophoresis 4mg /ml Dexamethasone;Moist Heat;Therapeutic activities;Therapeutic exercise;Neuromuscular re-education;Manual techniques;Passive range of motion;Dry needling;Taping;Spinal Manipulations;Patient/family education;Joint Manipulations    PT Next Visit Plan  review/ update HEP PRN, cerivcal Mobs/ distraction, STW for upper trap and surrounding musculature, Ionto for R thumb.    PT Home Exercise Plan  [id number] - upper trap/ levator scapulae stretch, chin tuck, upper cervical rotation,    Consulted and Agree with Plan of Care  Patient       Patient will benefit from skilled therapeutic intervention in order to improve the following deficits and impairments:  Improper body mechanics, Increased muscle spasms, Postural dysfunction, Pain, Decreased endurance, Decreased activity  tolerance  Visit Diagnosis: Cervicalgia  Pain in joints of right hand  Other muscle spasm     Problem List Patient Active Problem List   Diagnosis Date Noted  . 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 Alopecia areata 10/11/2017  . 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 Skin tag 10/11/2017  . Neck pain 09/21/2016  . Barbers' rash 06/29/2016  . 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 Diabetic neuropathy ([place]) 03/03/2016  . Neoplasm of uncertain behavior of thyroid gland 08/12/2015  . Adhesive capsulitis of left shoulder 06/07/2015  .  Multiple thyroid nodules 12/31/2014  . Left shoulder pain 12/31/2014  . Type 2 diabetes mellitus without complication ([place]) 0000000  . Furunculosis of skin 12/31/2014  . Dyslipidemia 12/31/2014  . Type 2 diabetes mellitus with diabetic polyneuropathy, without long-term current use of insulin ([place]) 02/04/2014  . Knee pain, right 11/18/2013  . Right knee pain 08/18/2013  . Low back pain 08/18/2013    Starr Lake PT, DPT, LAT, ATC  12/08/19  2:23 PM      [facility] [address] [place], [place], [address] Phone: [phone]   Fax:  [phone]  Name: Joe Wallace MRN: [id number] Date of Birth: 08/13/1961

## 2019-12-15 ENCOUNTER — Encounter: Payer: Medicaid Other | Attending: Nurse Practitioner | Admitting: Registered"

## 2019-12-17 ENCOUNTER — Ambulatory Visit: Payer: Medicaid Other | Admitting: Physical Therapy

## 2019-12-17 ENCOUNTER — Telehealth: Payer: Self-pay | Admitting: Physical Therapy

## 2019-12-17 NOTE — Telephone Encounter (Signed)
LVM regarding missed appointment today. Noted when his next scheduled appointment is and if he cannot make it to call and we can cancel or reschedule that appointment.    Karie Skowron PT, DPT, LAT, ATC  12/17/19  5:26 PM

## 2019-12-19 ENCOUNTER — Encounter (HOSPITAL_COMMUNITY): Payer: Self-pay

## 2019-12-19 ENCOUNTER — Emergency Department (HOSPITAL_COMMUNITY)
Admission: EM | Admit: 2019-12-19 | Discharge: 2019-12-19 | Disposition: A | Discharge status: Home / Self Care | Payer: Medicaid Other | Attending: Emergency Medicine | Admitting: Emergency Medicine

## 2019-12-19 ENCOUNTER — Other Ambulatory Visit: Payer: Self-pay

## 2019-12-19 DIAGNOSIS — E1140 Type 2 diabetes mellitus with diabetic neuropathy, unspecified: Secondary | ICD-10-CM | POA: Insufficient documentation

## 2019-12-19 DIAGNOSIS — Z794 Long term (current) use of insulin: Secondary | ICD-10-CM | POA: Diagnosis not present

## 2019-12-19 DIAGNOSIS — R070 Pain in throat: Secondary | ICD-10-CM | POA: Diagnosis not present

## 2019-12-19 DIAGNOSIS — Z79899 Other long term (current) drug therapy: Secondary | ICD-10-CM | POA: Insufficient documentation

## 2019-12-19 DIAGNOSIS — Z7982 Long term (current) use of aspirin: Secondary | ICD-10-CM | POA: Diagnosis not present

## 2019-12-19 DIAGNOSIS — U071 COVID-19: Secondary | ICD-10-CM | POA: Insufficient documentation

## 2019-12-19 DIAGNOSIS — Z8521 Personal history of malignant neoplasm of larynx: Secondary | ICD-10-CM | POA: Insufficient documentation

## 2019-12-19 DIAGNOSIS — Z20822 Contact with and (suspected) exposure to covid-19: Secondary | ICD-10-CM

## 2019-12-19 DIAGNOSIS — R509 Fever, unspecified: Secondary | ICD-10-CM | POA: Diagnosis present

## 2019-12-19 LAB — SARS CORONAVIRUS 2 (TAT 6-24 HRS): SARS Coronavirus 2: POSITIVE — AB

## 2019-12-19 MED ORDER — KETOROLAC TROMETHAMINE 30 MG/ML IJ SOLN
15.0000 mg | Freq: Once | INTRAMUSCULAR | Status: AC
  Administered 2019-12-19: 15 mg via INTRAMUSCULAR
  Filled 2019-12-19: qty 1

## 2019-12-19 MED ORDER — ACETAMINOPHEN 500 MG PO TABS: 500.0000 mg | ORAL_TABLET | Freq: Four times a day (QID) | ORAL | 0 refills | Status: DC | PRN

## 2019-12-19 MED ORDER — CHLORASEPTIC 6-10 MG MT LOZG: 1.0000 | LOZENGE | OROMUCOSAL | 0 refills | Status: DC | PRN

## 2019-12-19 MED ORDER — ACETAMINOPHEN 325 MG PO TABS
650.0000 mg | ORAL_TABLET | Freq: Once | ORAL | Status: AC | PRN
  Administered 2019-12-19: 650 mg via ORAL
  Filled 2019-12-19: qty 2

## 2019-12-19 NOTE — ED Notes (Signed)
Patient verbalizes understanding of discharge instructions. Opportunity for questioning and answers were provided. Pt discharged from ED. 
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
## 2019-12-19 NOTE — Discharge Instructions (Signed)
You can take 1 to 2 tablets of Tylenol (350mg -1000mg  depending on the dose) every 6 hours as needed for pain/fever.  Do not exceed 4000 mg of Tylenol daily.   Drink plenty of water and get rest.  Quarantine at home for 10 days from when your symptoms began.  You will receive a phone call if the Covid test result is positive but you can also view your results on MyChart and there are instructions on this paperwork for how to do that.  Follow-up with primary care provider for reevaluation of symptoms.  Return to the emergency department if any concerning signs or symptoms develop such as severe shortness of breath, chest pains, persistent vomiting, or loss of consciousness.

## 2019-12-19 NOTE — ED Triage Notes (Signed)
Pt reports headache, fever and sore throat since yesterday. pts wife tested positive for COVID 3 days ago. Pt a.o, resp e.u

## 2019-12-20 ENCOUNTER — Telehealth: Payer: Self-pay | Admitting: Physician Assistant

## 2019-12-20 ENCOUNTER — Other Ambulatory Visit: Payer: Self-pay | Admitting: Physician Assistant

## 2019-12-20 DIAGNOSIS — U071 COVID-19: Secondary | ICD-10-CM

## 2019-12-20 DIAGNOSIS — E1142 Type 2 diabetes mellitus with diabetic polyneuropathy: Secondary | ICD-10-CM

## 2019-12-20 DIAGNOSIS — C750 Malignant neoplasm of parathyroid gland: Secondary | ICD-10-CM

## 2019-12-20 MED ORDER — SODIUM CHLORIDE 0.9 % IV SOLN
700.0000 mg | Freq: Once | INTRAVENOUS | Status: DC
  Filled 2019-12-20: qty 20

## 2019-12-20 NOTE — Telephone Encounter (Signed)
Called to discuss with patient about Covid symptoms and the use of bamlanivimab or casirivimab/imdevimab, a monoclonal antibody infusion for those with mild to moderate Covid symptoms and at a high risk of hospitalization.  Pt is qualified for this infusion at the [facility] infusion center due to Age >55 with HTN and DMT2 as well as throat cancer.    Message left to call back with an arabic interpreter.   Angelena Form PA-C  [facility]

## 2019-12-20 NOTE — Progress Notes (Signed)
  I connected by phone with Elson Areas on 12/20/2019 at 8:57 AM to discuss the potential use of an new treatment for mild to moderate COVID-19 viral infection in non-hospitalized patients.  This patient is a 59 y.o. male that meets the FDA criteria for Emergency Use Authorization of bamlanivimab or casirivimab\imdevimab.  Has a (+) direct SARS-CoV-2 viral test result  Has mild or moderate COVID-19   Is ? 61 years of age and weighs ? 40 kg  Is NOT hospitalized due to COVID-19  Is NOT requiring oxygen therapy or requiring an increase in baseline oxygen flow rate due to COVID-19  Is within 10 days of symptom onset  Has at least one of the high risk factor(s) for progression to severe COVID-19 and/or hospitalization as defined in EUA.  Specific high risk criteria : Diabetes and immunosuppressive disease (cancer)   I have spoken and communicated the following to the patient or parent/caregiver:  1. FDA has authorized the emergency use of bamlanivimab and casirivimab\imdevimab for the treatment of mild to moderate COVID-19 in adults and pediatric patients with positive results of direct SARS-CoV-2 viral testing who are 19 years of age and older weighing at least 40 kg, and who are at high risk for progressing to severe COVID-19 and/or hospitalization.  2. The significant known and potential risks and benefits of bamlanivimab and casirivimab\imdevimab, and the extent to which such potential risks and benefits are unknown.  3. Information on available alternative treatments and the risks and benefits of those alternatives, including clinical trials.  4. Patients treated with bamlanivimab and casirivimab\imdevimab should continue to self-isolate and use infection control measures (e.g., wear mask, isolate, social distance, avoid sharing personal items, clean and disinfect "high touch" surfaces, and frequent handwashing) according to CDC guidelines.   5. The patient or parent/caregiver has  the option to accept or refuse bamlanivimab or casirivimab\imdevimab .  After reviewing this information with the patient, The patient agreed to proceed with receiving the bamlanimivab infusion and will be provided a copy of the Fact sheet prior to receiving the infusion.   Sx onset 12/18/19. Infusion set up for tomorrow 12/21/19 @ 10:30am. Spoke with son who will bring him and also translated. He will need an arabic interpreter.     Angelena Form 12/20/2019 8:57 AM

## 2019-12-21 ENCOUNTER — Ambulatory Visit (HOSPITAL_COMMUNITY)
Admission: RE | Admit: 2019-12-21 | Discharge: 2019-12-21 | Disposition: A | Discharge status: Home / Self Care | Payer: Medicaid Other | Source: Ambulatory Visit | Attending: Pulmonary Disease | Admitting: Pulmonary Disease

## 2019-12-21 DIAGNOSIS — E1142 Type 2 diabetes mellitus with diabetic polyneuropathy: Secondary | ICD-10-CM

## 2019-12-21 DIAGNOSIS — U071 COVID-19: Secondary | ICD-10-CM

## 2019-12-21 DIAGNOSIS — C750 Malignant neoplasm of parathyroid gland: Secondary | ICD-10-CM

## 2019-12-24 ENCOUNTER — Ambulatory Visit: Payer: Medicaid Other | Admitting: Physical Therapy

## 2019-12-30 ENCOUNTER — Encounter: Payer: Medicaid Other | Admitting: Physical Therapy

## 2020-01-02 ENCOUNTER — Encounter: Payer: Self-pay | Admitting: Nurse Practitioner

## 2020-01-13 DIAGNOSIS — S299XXA Unspecified injury of thorax, initial encounter: Secondary | ICD-10-CM | POA: Diagnosis not present

## 2020-01-13 DIAGNOSIS — R52 Pain, unspecified: Secondary | ICD-10-CM | POA: Diagnosis not present

## 2020-01-13 DIAGNOSIS — S199XXA Unspecified injury of neck, initial encounter: Secondary | ICD-10-CM | POA: Diagnosis not present

## 2020-01-13 DIAGNOSIS — R519 Headache, unspecified: Secondary | ICD-10-CM | POA: Diagnosis not present

## 2020-01-13 DIAGNOSIS — M542 Cervicalgia: Secondary | ICD-10-CM | POA: Diagnosis not present

## 2020-01-13 DIAGNOSIS — E1165 Type 2 diabetes mellitus with hyperglycemia: Secondary | ICD-10-CM | POA: Diagnosis not present

## 2020-01-13 DIAGNOSIS — R079 Chest pain, unspecified: Secondary | ICD-10-CM | POA: Diagnosis not present

## 2020-01-13 DIAGNOSIS — S0990XA Unspecified injury of head, initial encounter: Secondary | ICD-10-CM | POA: Diagnosis not present

## 2020-01-14 ENCOUNTER — Other Ambulatory Visit: Payer: Self-pay

## 2020-01-14 ENCOUNTER — Ambulatory Visit: Payer: Medicaid Other | Attending: Nurse Practitioner | Admitting: Nurse Practitioner

## 2020-01-14 ENCOUNTER — Encounter: Payer: Self-pay | Admitting: Nurse Practitioner

## 2020-01-14 DIAGNOSIS — Z8616 Personal history of COVID-19: Secondary | ICD-10-CM | POA: Diagnosis not present

## 2020-01-14 DIAGNOSIS — Z79899 Other long term (current) drug therapy: Secondary | ICD-10-CM | POA: Insufficient documentation

## 2020-01-14 DIAGNOSIS — Y9389 Activity, other specified: Secondary | ICD-10-CM | POA: Insufficient documentation

## 2020-01-14 DIAGNOSIS — Z09 Encounter for follow-up examination after completed treatment for conditions other than malignant neoplasm: Secondary | ICD-10-CM

## 2020-01-14 DIAGNOSIS — Y92410 Unspecified street and highway as the place of occurrence of the external cause: Secondary | ICD-10-CM | POA: Diagnosis not present

## 2020-01-14 DIAGNOSIS — M542 Cervicalgia: Secondary | ICD-10-CM | POA: Insufficient documentation

## 2020-01-14 DIAGNOSIS — M1990 Unspecified osteoarthritis, unspecified site: Secondary | ICD-10-CM | POA: Diagnosis not present

## 2020-01-14 DIAGNOSIS — R109 Unspecified abdominal pain: Secondary | ICD-10-CM | POA: Diagnosis not present

## 2020-01-14 DIAGNOSIS — R519 Headache, unspecified: Secondary | ICD-10-CM | POA: Insufficient documentation

## 2020-01-14 DIAGNOSIS — U071 COVID-19: Secondary | ICD-10-CM

## 2020-01-14 DIAGNOSIS — E785 Hyperlipidemia, unspecified: Secondary | ICD-10-CM | POA: Insufficient documentation

## 2020-01-14 DIAGNOSIS — G8929 Other chronic pain: Secondary | ICD-10-CM

## 2020-01-14 DIAGNOSIS — E1165 Type 2 diabetes mellitus with hyperglycemia: Secondary | ICD-10-CM | POA: Insufficient documentation

## 2020-01-14 MED ORDER — BENZONATATE 100 MG PO CAPS: 100.0000 mg | ORAL_CAPSULE | Freq: Two times a day (BID) | ORAL | 0 refills | Status: DC | PRN

## 2020-01-14 MED ORDER — CHLORASEPTIC 6-10 MG MT LOZG: 1.0000 | LOZENGE | OROMUCOSAL | 0 refills | Status: DC | PRN

## 2020-01-14 MED ORDER — CYCLOBENZAPRINE HCL 5 MG PO TABS: 5.0000 mg | ORAL_TABLET | Freq: Three times a day (TID) | ORAL | 1 refills | Status: DC | PRN
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
## 2020-01-15 ENCOUNTER — Encounter: Payer: Self-pay | Admitting: Nurse Practitioner

## 2020-01-20 ENCOUNTER — Ambulatory Visit: Payer: Medicaid Other | Admitting: Registered"

## 2020-01-25 ENCOUNTER — Other Ambulatory Visit: Payer: Self-pay | Admitting: Family Medicine

## 2020-01-25 DIAGNOSIS — E890 Postprocedural hypothyroidism: Secondary | ICD-10-CM

## 2020-01-28 ENCOUNTER — Encounter: Payer: Medicaid Other | Attending: Nurse Practitioner | Admitting: Registered"

## 2020-01-28 ENCOUNTER — Other Ambulatory Visit: Payer: Self-pay

## 2020-01-28 ENCOUNTER — Encounter: Payer: Self-pay | Admitting: Registered"

## 2020-01-28 DIAGNOSIS — E1142 Type 2 diabetes mellitus with diabetic polyneuropathy: Secondary | ICD-10-CM | POA: Insufficient documentation

## 2020-01-28 DIAGNOSIS — Z713 Dietary counseling and surveillance: Secondary | ICD-10-CM | POA: Insufficient documentation

## 2020-01-28 DIAGNOSIS — E119 Type 2 diabetes mellitus without complications: Secondary | ICD-10-CM

## 2020-01-28 NOTE — Progress Notes (Signed)
Diabetes Self-Management Education  Visit Type: Follow-up  Appt. Start Time: 1410 Appt. End Time: [id number]  01/28/2020  [name], identified by name and date of birth, is a 59 y.o. male with a diagnosis of Diabetes:  .   Arabic Interpreter: [place] [employer] Kamal  ASSESSMENT  There were no vitals taken for this visit. There is no height or weight on file to calculate BMI.   Pt states since his last visit in December he had COVID but has recovered and feeling much better.  Pt A1c at first visit was 10.3% and has come down to 7.8%. Per patient reported self-monitoring blood sugar, his A1c is likely close to if not below 7% now.  Pt reported FBS: average-120-140 mg/dL, has been as low as 90-95 mg/dL. Lowest FBSL 80 mg/dL. Post meal: 150-160 mg/dL.  Pt reports his blood pressure is stable 128/86.  Pt states he continues to exercise, now is up to 30-45 min/day.   Pt states he continues to limit himself to 2 meals per day and states he prefers to eat this way. Pt denies hypoglycemia.  Pt states he will make appointment with his PCP to get an updated A1c and then call [facility] to make a follow-up appointment.  Diabetes Self-Management Education - 01/28/20 1418      Visit Information   Visit Type  Follow-up      Complications   Last HgB A1C per patient/outside source  7.8 %      Exercise   Exercise Type  Light (walking / raking leaves)    How many days per week to you exercise?  7    How many minutes per day do you exercise?  40    Total minutes per week of exercise  280      Patient Education   Physical activity and exercise   Role of exercise on diabetes management, blood pressure control and cardiac health.    Monitoring  Purpose and frequency of SMBG.;Identified appropriate SMBG and/or A1C goals.      Individualized Goals (developed by patient)   Nutrition  General guidelines for healthy choices and portions discussed    Physical Activity  Exercise 5-7 days per  week    Medications  take my medication as prescribed    Monitoring   test my blood glucose as discussed      Outcomes   Expected Outcomes  Demonstrated interest in learning. Expect positive outcomes    Future DMSE  Other (comment)   after next A1c lab   Program Status  Not Completed      Subsequent Visit   Since your last visit have you continued or begun to take your medications as prescribed?  Yes    Since your last visit have you had your blood pressure checked?  Yes    Is your most recent blood pressure lower, unchanged, or higher since your last visit?  Unchanged    Since your last visit, are you checking your blood glucose at least once a day?  Yes       Individualized Plan for Diabetes Self-Management Training:   Learning Objective:  Patient will have a greater understanding of diabetes self-management. Patient education plan is to attend individual and/or group sessions per assessed needs and concerns.   Patient Instructions  Doristine Devoid job on changes you have made that has resulted in a great improvement in your blood sugar A1c test! Continue with your level of physical activity as tolerated Continue taking medication  as directed by MD Return for follow-up after your next A1c test   Expected Outcomes:  Demonstrated interest in learning. Expect positive outcomes  Education material provided: A1C conversion sheet, Planning Healthy Meals  If problems or questions, patient to contact team via:  Phone  Future DSME appointment: Other (comment)(after next A1c lab)

## 2020-01-28 NOTE — Patient Instructions (Signed)
Great job on changes you have made that has resulted in a great improvement in your blood sugar A1c test! Continue with your level of physical activity as tolerated Continue taking medication as directed by MD Return for follow-up after your next A1c test

## 2020-02-03 ENCOUNTER — Ambulatory Visit: Payer: Medicaid Other | Attending: Nurse Practitioner | Admitting: Nurse Practitioner

## 2020-02-03 ENCOUNTER — Other Ambulatory Visit: Payer: Self-pay

## 2020-02-03 ENCOUNTER — Encounter: Payer: Self-pay | Admitting: Nurse Practitioner

## 2020-02-03 DIAGNOSIS — E782 Mixed hyperlipidemia: Secondary | ICD-10-CM | POA: Diagnosis not present

## 2020-02-03 DIAGNOSIS — Z79899 Other long term (current) drug therapy: Secondary | ICD-10-CM | POA: Diagnosis not present

## 2020-02-03 DIAGNOSIS — Z833 Family history of diabetes mellitus: Secondary | ICD-10-CM | POA: Insufficient documentation

## 2020-02-03 DIAGNOSIS — E1142 Type 2 diabetes mellitus with diabetic polyneuropathy: Secondary | ICD-10-CM | POA: Diagnosis not present

## 2020-02-03 DIAGNOSIS — E785 Hyperlipidemia, unspecified: Secondary | ICD-10-CM | POA: Insufficient documentation

## 2020-02-03 DIAGNOSIS — Z794 Long term (current) use of insulin: Secondary | ICD-10-CM | POA: Insufficient documentation

## 2020-02-03 MED ORDER — GLIPIZIDE ER 10 MG PO TB24: ORAL_TABLET | ORAL | 1 refills | Status: DC

## 2020-02-03 MED ORDER — BD PEN NEEDLE MINI U/F 31G X 5 MM MISC: 6 refills | Status: DC

## 2020-02-03 MED ORDER — ACCU-CHEK GUIDE VI STRP: ORAL_STRIP | 11 refills | Status: DC

## 2020-02-03 MED ORDER — ATORVASTATIN CALCIUM 40 MG PO TABS: 40.0000 mg | ORAL_TABLET | Freq: Every day | ORAL | 3 refills | Status: DC

## 2020-02-03 MED ORDER — FENOFIBRATE 145 MG PO TABS: 145.0000 mg | ORAL_TABLET | Freq: Every day | ORAL | 1 refills | Status: DC

## 2020-02-03 MED ORDER — ACCU-CHEK FASTCLIX LANCETS MISC: 11 refills | Status: DC

## 2020-02-03 MED ORDER — METFORMIN HCL 1000 MG PO TABS: 1000.0000 mg | ORAL_TABLET | Freq: Two times a day (BID) | ORAL | 3 refills | Status: DC
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
## 2020-02-10 ENCOUNTER — Other Ambulatory Visit: Payer: Medicaid Other

## 2020-02-23 ENCOUNTER — Encounter: Payer: Self-pay | Admitting: Nurse Practitioner

## 2020-02-25 IMAGING — DX DG SHOULDER 2+V*L*
3 series · 3 of 3 positions shown · non-contrast
Comparison: None.

CLINICAL DATA: 57-year-old male with a history of shoulder pain

EXAM:
LEFT SHOULDER - 2+ VIEW

[shoulder grashey]
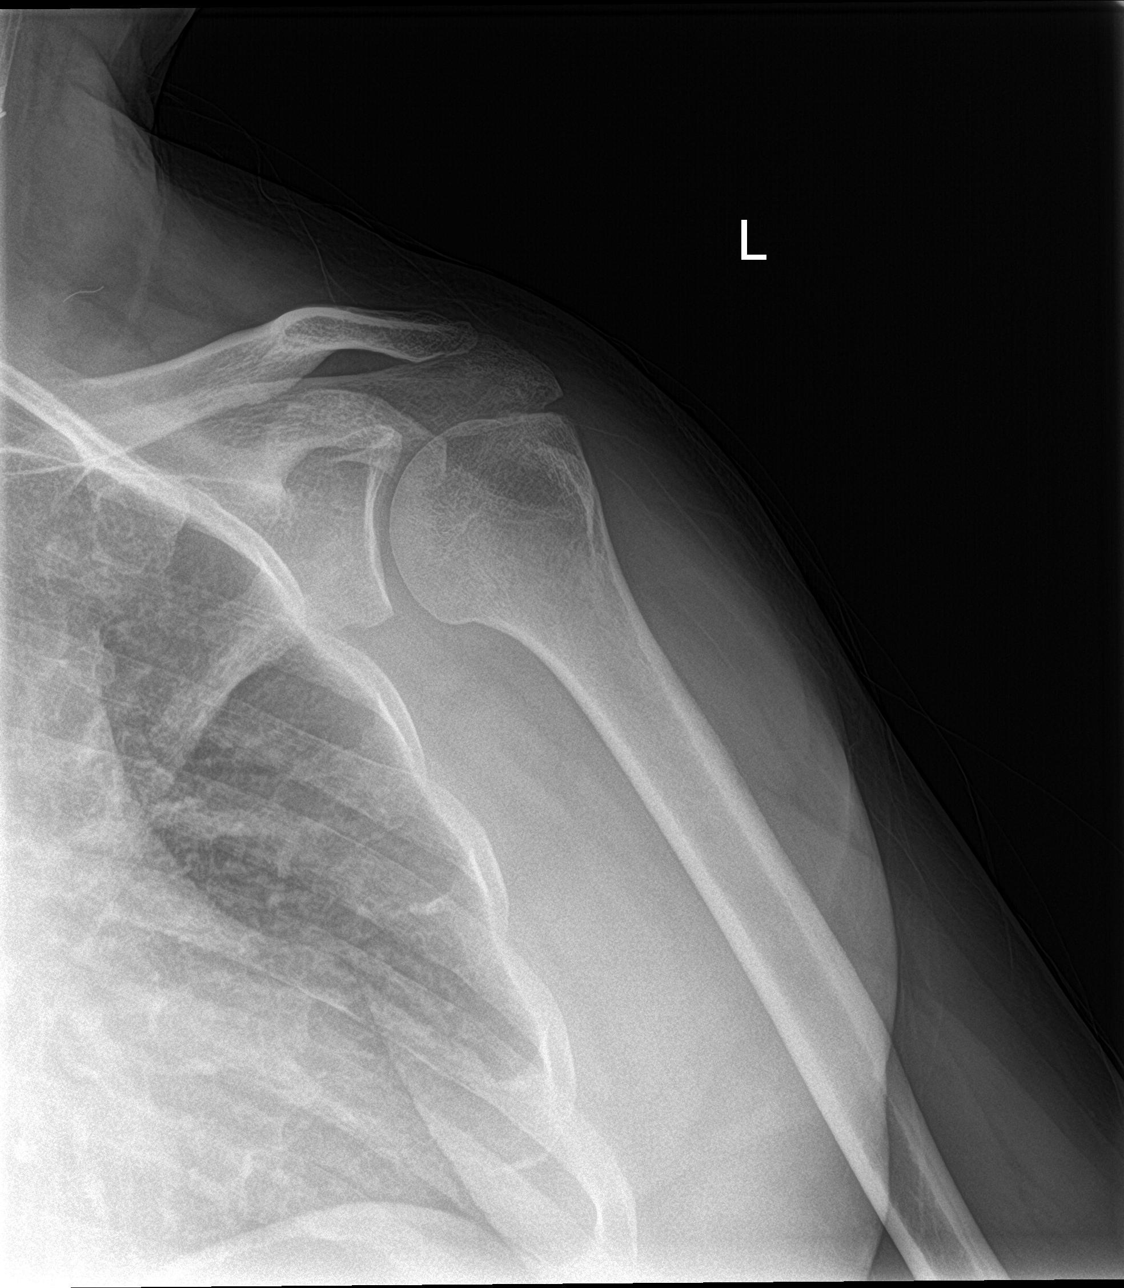

[shoulder y view]
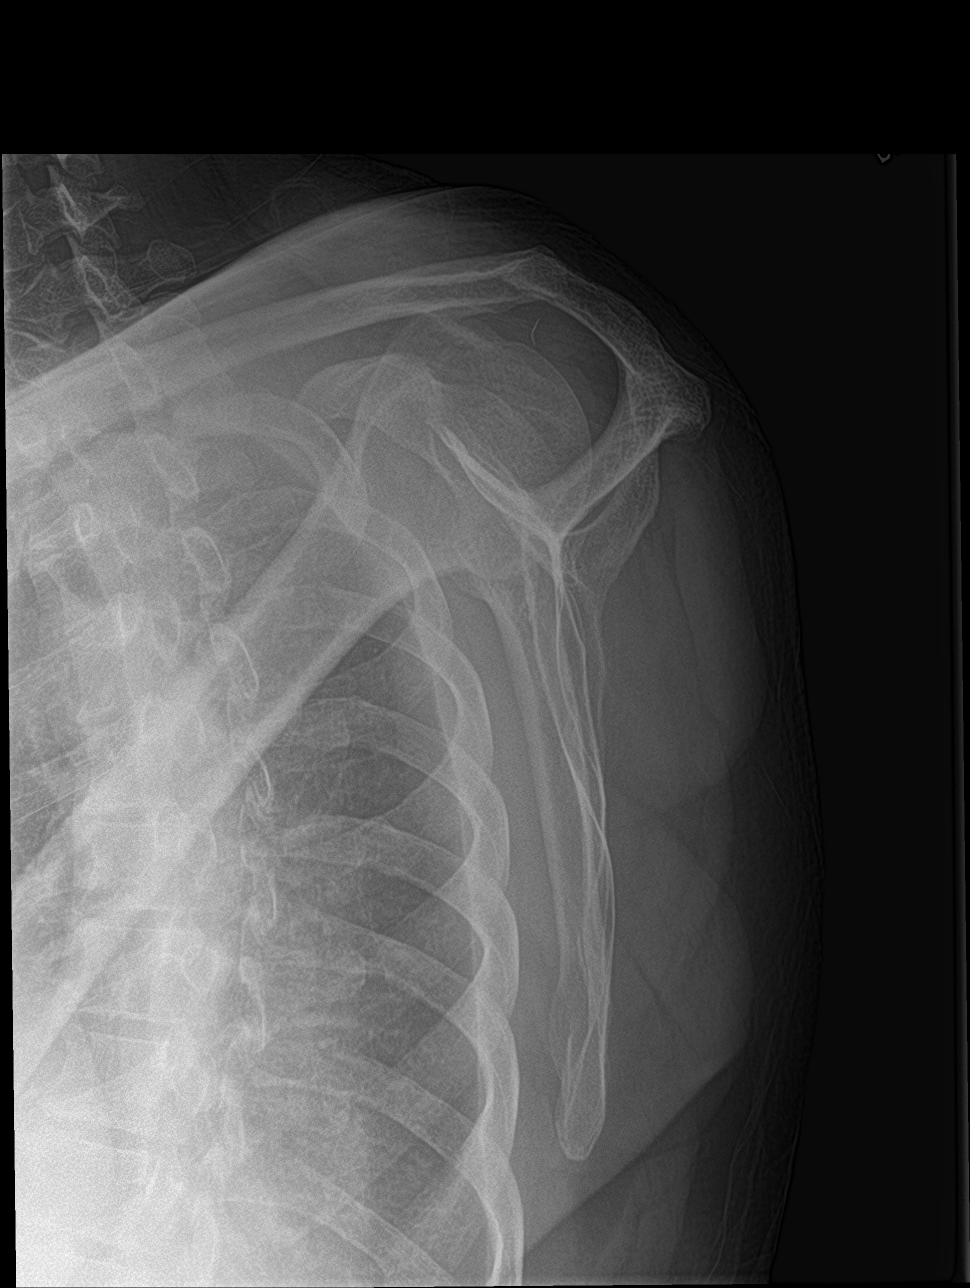

[shoulder axillary]
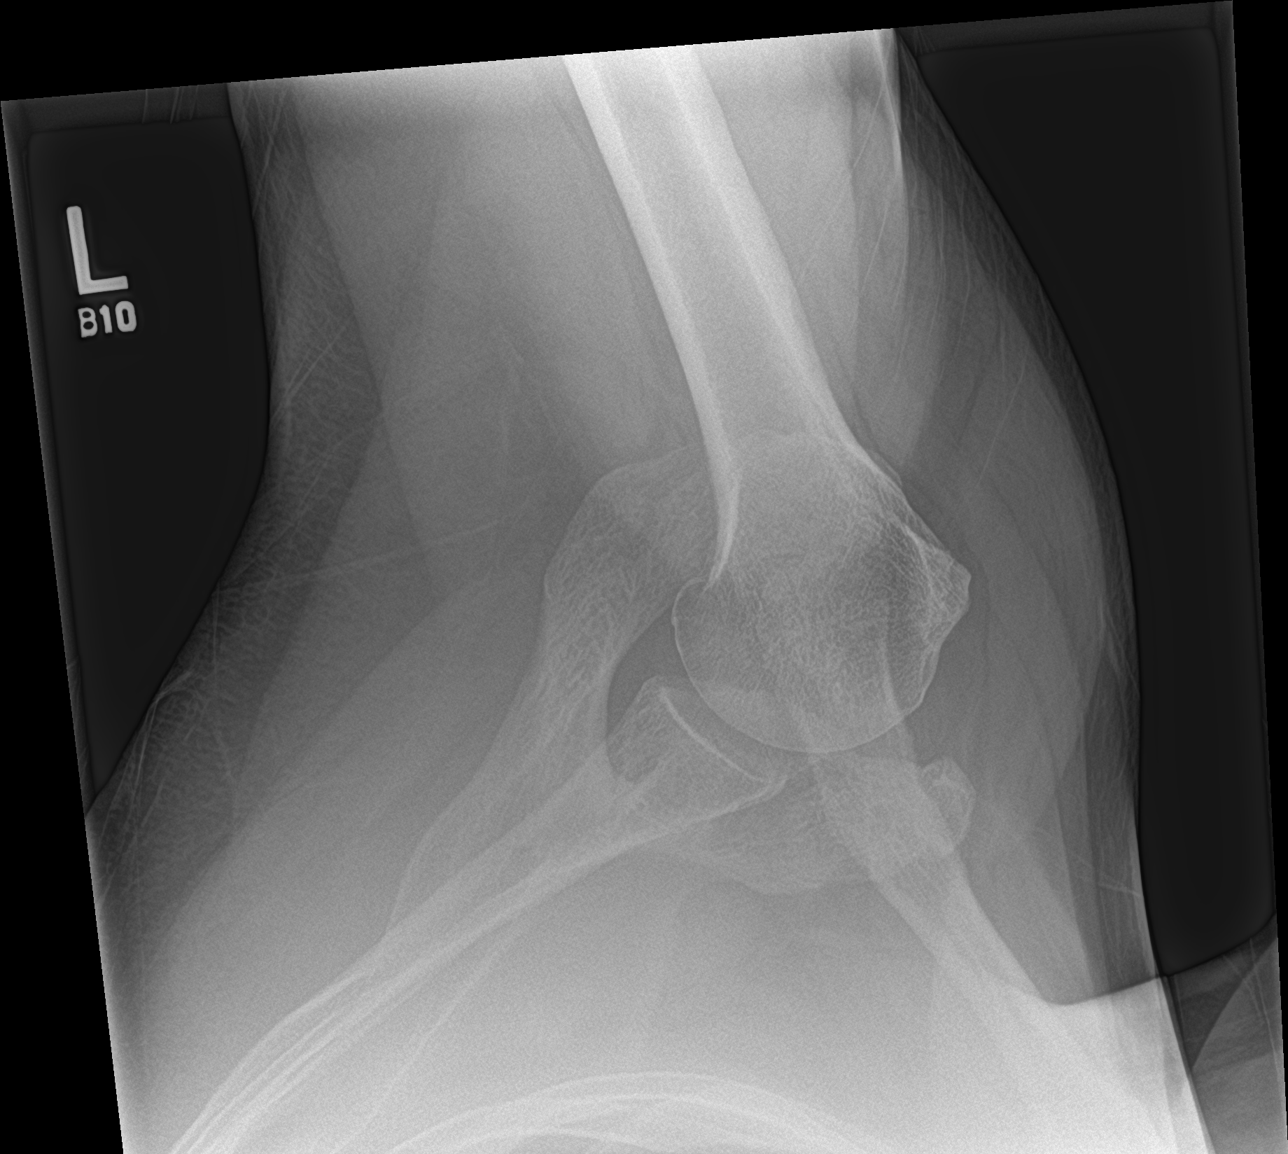

[3 of 3 positions shown; findings below may reference images not displayed]

FINDINGS: Glenohumeral joint appears congruent. No acute displaced fracture.
Mild degenerative changes of the acromioclavicular joint.
IMPRESSION: Negative for acute bony abnormality

## 2020-04-07 ENCOUNTER — Ambulatory Visit (INDEPENDENT_AMBULATORY_CARE_PROVIDER_SITE_OTHER): Payer: Medicaid Other | Admitting: Endocrinology

## 2020-04-07 ENCOUNTER — Other Ambulatory Visit: Payer: Self-pay

## 2020-04-07 ENCOUNTER — Encounter: Payer: Self-pay | Admitting: Endocrinology

## 2020-04-07 VITALS — BP 132/82 | HR 82 | Wt 228.2 lb

## 2020-04-07 DIAGNOSIS — E1142 Type 2 diabetes mellitus with diabetic polyneuropathy: Secondary | ICD-10-CM

## 2020-04-07 LAB — POCT GLYCOSYLATED HEMOGLOBIN (HGB A1C): Hemoglobin A1C: 7.7 % — AB (ref 4.0–5.6)

## 2020-04-07 MED ORDER — LINAGLIPTIN 5 MG PO TABS: 5.0000 mg | ORAL_TABLET | Freq: Every day | ORAL | 3 refills | Status: DC

## 2020-04-07 MED ORDER — GLIPIZIDE 5 MG PO TABS: 5.0000 mg | ORAL_TABLET | Freq: Every day | ORAL | 3 refills | Status: DC

## 2020-04-07 NOTE — Patient Instructions (Addendum)
good diet and exercise significantly improve the control of your diabetes.  please let me know if you wish to be referred to a dietician.  high blood sugar is very risky to your health.  you should see an eye doctor and dentist every year.  It is very important to get all recommended vaccinations.  Controlling your blood pressure and cholesterol drastically reduces the damage diabetes does to your body.  Those who smoke should quit.  Please discuss these with your doctor.  check your blood sugar once a day.  vary the time of day when you check, between before the 3 meals, and at bedtime.  also check if you have symptoms of your blood sugar being too high or too low.  please keep a record of the readings and bring it to your next appointment here (or you can bring the meter itself).  You can write it on any piece of paper.  please call us sooner if your blood sugar goes below 70, or if you have a lot of readings over 200. I have sent a prescription to your pharmacy: to reduce the glipizide to 5 mg with breakfast only, and to add "Tradjenta." Please continue the same metformin. Please come back for a follow-up appointment in 2 months.                 .          .          .         .         .                .      .     .      Marland Kitchen                  Marland Kitchen                 .          (    ).       .               70          200.        :    5        "Tradjenta".     .       .
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
## 2020-05-12 ENCOUNTER — Other Ambulatory Visit: Payer: Self-pay

## 2020-05-12 ENCOUNTER — Ambulatory Visit: Payer: Medicaid Other | Attending: Nurse Practitioner | Admitting: Nurse Practitioner

## 2020-05-12 ENCOUNTER — Encounter: Payer: Self-pay | Admitting: Nurse Practitioner

## 2020-05-12 DIAGNOSIS — E890 Postprocedural hypothyroidism: Secondary | ICD-10-CM

## 2020-05-12 DIAGNOSIS — E785 Hyperlipidemia, unspecified: Secondary | ICD-10-CM | POA: Diagnosis not present

## 2020-05-12 DIAGNOSIS — E1142 Type 2 diabetes mellitus with diabetic polyneuropathy: Secondary | ICD-10-CM

## 2020-05-12 NOTE — Progress Notes (Signed)
Virtual Visit via Telephone Note Due to national recommendations of social distancing due to COVID 19, telehealth visit is felt to be most appropriate for this patient at this time.  I discussed the limitations, risks, security and privacy concerns of performing an evaluation and management service by telephone and the availability of in person appointments. I also discussed with the patient that there may be a patient responsible charge related to this service. The patient expressed understanding and agreed to proceed.    I connected with Joe Wallace on 05/12/20  at   2:10 PM EDT  EDT by telephone and verified that I am speaking with the correct person using two identifiers.   Consent I discussed the limitations, risks, security and privacy concerns of performing an evaluation and management service by telephone and the availability of in person appointments. I also discussed with the patient that there may be a patient responsible charge related to this service. The patient expressed understanding and agreed to proceed.   Location of Patient: Private Residence   Location of Provider: [facility]    Persons participating in Telemedicine visit: [name] FNP-BC YY [place] CMA AYMAN BRULL  Arabic Interpreter ZY#[id number]   History of Present Illness: Telemedicine visit for: F/U Seeing Dr. Everardo All for his T2DM Lab Results  Component Value Date   HGBA1C 7.7 (A) 04/07/2020  PMH:  Arthritis, Cancer (HCC) (2017), Depression, Diabetes mellitus without complication (HCC), Headache, Hyperlipidemia, Memory loss, Thyroid neoplasm, and Visual disorder.  Doing well today. Has questions regarding receiving the covid vaccine since he already contracted COVID. I recommended that he still receive the COVID vaccine. Denies chest pain, shortness of breath, palpitations, lightheadedness, dizziness, headaches or BLE edema.    Postoperative Hypothyroidism Thyroid  levels WNL. Taking euthyrox 125mg  daily. Denies fatigue, weight gain, weight loss, palpitations, anxiety.  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   SHOULDER ARTHROSCOPY     THYROIDECTOMY N/A 08/13/2015   Procedure: TOTAL THYROIDECTOMY;  Surgeon: 08/15/2015, MD;  Location: [facility];  Service: General;  Laterality: N/A;    Family History  Problem Relation Age of Onset   Diabetes Mother    Hypertension Mother    Hypertension Father    Diabetes Father    Colon cancer Neg Hx    Colon polyps Neg Hx    Esophageal cancer Neg Hx    Rectal cancer Neg Hx    Stomach cancer Neg Hx     Social History   Socioeconomic History   Marital status: Married    Spouse name: Not on file   Number of children: Not on file   Years of education: Not on file   Highest education level: Not on file  Occupational History   Not on file  Tobacco Use   Smoking status: Never Smoker   Smokeless tobacco: Never Used  Vaping Use   Vaping Use: Never used  Substance and Sexual Activity  Alcohol use: No   Drug use: No   Sexual activity: Yes  Other Topics Concern   Not on file  Social History Narrative   Lives home with wife and 7 boyss, [place].  Does not work, SSI.  Education: middle school.  Soda once daily.    Social Determinants of Health   Financial Resource Strain:    Difficulty of Paying Living Expenses:   Food Insecurity:    Worried About Programme researcher, broadcasting/film/video in the Last Year:    Barista in the Last Year:   Transportation Needs:    Sales promotion account executive (Medical):    Lack of Transportation (Non-Medical):   Physical Activity:    Days of Exercise per Week:    Minutes of Exercise per Session:   Stress:    Feeling of Stress :   Social Connections:    Frequency of Communication with Friends and Family:    Frequency of Social Gatherings with Friends and Family:    Attends Religious Services:    Active Member of Clubs or Organizations:    Attends Engineer, structural:    Marital Status:      Observations/Objective: Awake, alert and oriented x 3   Review of Systems  Constitutional: Negative for fever, malaise/fatigue and weight loss.  HENT: Negative.  Negative for nosebleeds.   Eyes: Negative.  Negative for blurred vision, double vision and photophobia.  Respiratory: Negative.  Negative for cough and shortness of breath.   Cardiovascular: Negative.  Negative for chest pain, palpitations and leg swelling.  Gastrointestinal: Negative.  Negative for heartburn, nausea and vomiting.  Musculoskeletal: Negative.  Negative for myalgias.  Neurological: Negative.  Negative for dizziness, focal weakness, seizures and headaches.  Psychiatric/Behavioral: Negative.  Negative for suicidal ideas.    Assessment and Plan: Joe Wallace was seen today for follow-up.  Diagnoses and all orders for this visit:  Postoperative hypothyroidism -     TSH; Future  Type 2 diabetes mellitus with diabetic polyneuropathy, without long-term current use of insulin (HCC) -     CMP14+EGFR; Future -     Ambulatory referral to Ophthalmology Continue blood sugar control as discussed in office today, low carbohydrate diet, and regular physical exercise as tolerated, 150 minutes per week (30 min each day, 5 days per week, or 50 min 3 days per week). Keep blood sugar logs with fasting goal of 90-130 mg/dl, post prandial (after you eat) less than 180.  For Hypoglycemia: BS <60 and Hyperglycemia BS >400; contact the clinic ASAP. Annual eye  exams and foot exams are recommended.  Dyslipidemia -     Lipid panel; Future INSTRUCTIONS: Work on a low fat, heart healthy diet and participate in regular aerobic exercise program by working out at least 150 minutes per week; 5 days a week-30 minutes per day. Avoid red meat/beef/steak,  fried foods. junk foods, sodas, sugary drinks, unhealthy snacking, alcohol and smoking.  Drink at least 80 oz of water per day and monitor your carbohydrate intake daily.       Follow Up Instructions Return in about 3 months (around 08/12/2020).     I discussed the assessment and treatment plan with the patient. The patient was provided an opportunity to ask questions and all were answered. The patient agreed with the plan and demonstrated an understanding of the instructions.   The patient was advised to call back or seek an in-person evaluation if the symptoms worsen or if the  condition fails to improve as anticipated.  I provided 16 minutes of non-face-to-face time during this encounter including median intraservice time, reviewing previous notes, labs, imaging, medications and explaining diagnosis and management.  Gildardo Pounds, FNP-BC

## 2020-05-17 ENCOUNTER — Other Ambulatory Visit: Payer: Medicaid Other

## 2020-05-20 ENCOUNTER — Other Ambulatory Visit: Payer: Self-pay | Admitting: Family Medicine

## 2020-05-20 DIAGNOSIS — E890 Postprocedural hypothyroidism: Secondary | ICD-10-CM

## 2020-05-20 NOTE — Telephone Encounter (Signed)
Requested Prescriptions  Pending Prescriptions Disp Refills   EUTHYROX 125 MCG tablet [Pharmacy Med Name: Euthyrox 125 MCG Oral Tablet] 90 tablet 0    Sig: Take 1 tablet by mouth once daily     Endocrinology:  Hypothyroid Agents Failed - 05/20/2020  2:43 PM      Failed - TSH needs to be rechecked within 3 months after an abnormal result. Refill until TSH is due.      Passed - TSH in normal range and within 360 days    TSH  Date Value Ref Range Status  08/01/2019 3.400 0.450 - 4.500 uIU/mL Final         Passed - Valid encounter within last 12 months    Recent Outpatient Visits          1 week ago Postoperative hypothyroidism   [place], [place] W, NP   3 months ago Type 2 diabetes mellitus with diabetic polyneuropathy, without long-term current use of insulin [facility])   [place], Vernia Buff, NP   4 months ago Hospital discharge follow-up   [place], [place] W, NP   6 months ago Type 2 diabetes mellitus with diabetic polyneuropathy, without long-term current use of insulin [facility])   [place], [place] W, NP   8 months ago Type 2 diabetes mellitus with diabetic polyneuropathy, without long-term current use of insulin [facility])   [place], RPH-CPP      Future Appointments            In 2 months Gildardo Pounds, NP [place]

## 2020-06-09 ENCOUNTER — Ambulatory Visit: Payer: Medicaid Other | Admitting: Endocrinology

## 2020-06-10 ENCOUNTER — Other Ambulatory Visit: Payer: Self-pay

## 2020-06-10 ENCOUNTER — Ambulatory Visit: Payer: Medicaid Other | Attending: Nurse Practitioner

## 2020-06-10 DIAGNOSIS — E1142 Type 2 diabetes mellitus with diabetic polyneuropathy: Secondary | ICD-10-CM

## 2020-06-11 LAB — CMP14+EGFR
ALT: 16 IU/L (ref 0–44)
AST: 12 IU/L (ref 0–40)
Albumin/Globulin Ratio: 1.5 (ref 1.2–2.2)
Albumin: 4.6 g/dL (ref 3.8–4.8)
Alkaline Phosphatase: 61 IU/L (ref 44–121)
BUN/Creatinine Ratio: 18 (ref 10–24)
BUN: 21 mg/dL (ref 8–27)
Bilirubin Total: 0.3 mg/dL (ref 0.0–1.2)
CO2: 27 mmol/L (ref 20–29)
Calcium: 9.8 mg/dL (ref 8.6–10.2)
Chloride: 101 mmol/L (ref 96–106)
Creatinine, Ser: 1.17 mg/dL (ref 0.76–1.27)
GFR calc Af Amer: 77 mL/min/{1.73_m2} (ref >59)
GFR calc non Af Amer: 67 mL/min/{1.73_m2} (ref >59)
Globulin, Total: 3.1 g/dL (ref 1.5–4.5)
Glucose: 270 mg/dL — ABNORMAL HIGH (ref 65–99)
Potassium: 4.5 mmol/L (ref 3.5–5.2)
Sodium: 138 mmol/L (ref 134–144)
Total Protein: 7.7 g/dL (ref 6.0–8.5)

## 2020-06-14 ENCOUNTER — Other Ambulatory Visit: Payer: Self-pay | Admitting: Nurse Practitioner

## 2020-06-14 DIAGNOSIS — E1142 Type 2 diabetes mellitus with diabetic polyneuropathy: Secondary | ICD-10-CM

## 2020-06-21 ENCOUNTER — Other Ambulatory Visit: Payer: Self-pay | Admitting: Nurse Practitioner

## 2020-06-21 DIAGNOSIS — E1142 Type 2 diabetes mellitus with diabetic polyneuropathy: Secondary | ICD-10-CM

## 2020-06-27 ENCOUNTER — Other Ambulatory Visit: Payer: Self-pay | Admitting: Nurse Practitioner

## 2020-06-27 DIAGNOSIS — U071 COVID-19: Secondary | ICD-10-CM

## 2020-06-27 DIAGNOSIS — E1142 Type 2 diabetes mellitus with diabetic polyneuropathy: Secondary | ICD-10-CM

## 2020-07-01 ENCOUNTER — Other Ambulatory Visit: Payer: Self-pay

## 2020-07-01 ENCOUNTER — Encounter: Payer: Self-pay | Admitting: Endocrinology

## 2020-07-01 ENCOUNTER — Ambulatory Visit (INDEPENDENT_AMBULATORY_CARE_PROVIDER_SITE_OTHER): Payer: Medicaid Other | Admitting: Endocrinology

## 2020-07-01 VITALS — BP 124/78 | HR 72 | Ht 70.0 in | Wt 233.0 lb

## 2020-07-01 DIAGNOSIS — E1142 Type 2 diabetes mellitus with diabetic polyneuropathy: Secondary | ICD-10-CM

## 2020-07-01 LAB — POCT GLYCOSYLATED HEMOGLOBIN (HGB A1C): Hemoglobin A1C: 8.2 % — AB (ref 4.0–5.6)

## 2020-07-01 MED ORDER — EMPAGLIFLOZIN 25 MG PO TABS: 25.0000 mg | ORAL_TABLET | Freq: Every day | ORAL | 11 refills | Status: DC

## 2020-07-01 MED ORDER — LINAGLIPTIN 5 MG PO TABS
5.0000 mg | ORAL_TABLET | Freq: Every day | ORAL | 3 refills | Status: DC
Start: 2020-07-01 — End: 2020-08-12

## 2020-07-01 NOTE — Progress Notes (Signed)
Subjective:    Patient ID: Joe Wallace, male    DOB: 07/20/1961, 59 y.o.   MRN: [id number]  HPI Pt returns for f/u of diabetes mellitus: DM type: 2 Dx'ed: 4496 Complications:PN and stage 3 CRI  Therapy: 3 oral meds DKA: never Severe hypoglycemia: never Pancreatitis: never Pancreatic imaging: never SDOH: he has memory loss Other: he has never been on insulin Interval history: He has not recently checked cbg.  He requests a new meter.  Main symptom is memory loss.    Past Medical History:  Diagnosis Date  . Arthritis   . Cancer ([facility]) 2017   in the throat (per pt chart)  . Depression   . Diabetes mellitus without complication ([place])   . Headache   . Hyperlipidemia   . Memory loss   . Thyroid neoplasm   . Visual disorder    LEFT EYE    Past Surgical History:  Procedure Laterality Date  . head trauma     L sided fragment from war.  Marland Kitchen SHOULDER ARTHROSCOPY    . THYROIDECTOMY N/A 08/13/2015   Procedure: TOTAL THYROIDECTOMY;  Surgeon: Armandina Gemma, MD;  Location: [facility];  Service: General;  Laterality: N/A;    Social History   Socioeconomic History  . Marital status: Married    Spouse name: Not on file  . Number of children: Not on file  . Years of education: Not on file  . Highest education level: Not on file  Occupational History  . Not on file  Tobacco Use  . Smoking status: Never Smoker  . Smokeless tobacco: Never Used  Vaping Use  . Vaping Use: Never used  Substance and Sexual Activity  . Alcohol use: No  . Drug use: No  . Sexual activity: Yes  Other Topics Concern  . Not on file  Social History Narrative   Lives home with wife and 7 boyss, [place].  Does not work, SSI.  Education: middle school.  Soda once daily.    Social Determinants of Health   Financial Resource Strain:   . Difficulty of Paying Living Expenses: Not on file  Food Insecurity:   . Worried About Charity fundraiser in the Last Year: Not on file  . Ran  Out of Food in the Last Year: Not on file  Transportation Needs:   . Lack of Transportation (Medical): Not on file  . Lack of Transportation (Non-Medical): Not on file  Physical Activity:   . Days of Exercise per Week: Not on file  . Minutes of Exercise per Session: Not on file  Stress:   . Feeling of Stress : Not on file  Social Connections:   . Frequency of Communication with Friends and Family: Not on file  . Frequency of Social Gatherings with Friends and Family: Not on file  . Attends Religious Services: Not on file  . Active Member of Clubs or Organizations: Not on file  . Attends Archivist Meetings: Not on file  . Marital Status: Not on file  Intimate Partner Violence:   . Fear of Current or Ex-Partner: Not on file  . Emotionally Abused: Not on file  . Physically Abused: Not on file  . Sexually Abused: Not on file    Current Outpatient Medications on File Prior to Visit  Medication Sig Dispense Refill  . Accu-Chek FastClix Lancets MISC Use to check blood sugar twice daily. E11.42 102 each 11  . acetaminophen (TYLENOL) 500 MG  tablet Take 1 tablet (500 mg total) by mouth every 6 (six) hours as needed. 30 tablet 0  . aspirin 81 MG tablet Take 81 mg by mouth daily.    Marland Kitchen atorvastatin (LIPITOR) 40 MG tablet Take 1 tablet (40 mg total) by mouth daily. 90 tablet 3  . Blood Glucose Monitoring Suppl (ACCU-CHEK GUIDE ME) w/Device KIT 1 kit by Does not apply route 2 (two) times daily. Use to check blood sugar twice daily. E11.42 1 kit 0  . celecoxib (CELEBREX) 200 MG capsule Take 1 capsule (200 mg total) by mouth 2 (two) times daily as needed. 60 capsule 6  . cyclobenzaprine (FLEXERIL) 5 MG tablet Take 1 tablet (5 mg total) by mouth 3 (three) times daily as needed for muscle spasms. 30 tablet 1  . diclofenac Sodium (VOLTAREN) 1 % GEL Apply 4 g topically 4 (four) times daily as needed. 500 g 6  . EUTHYROX 125 MCG tablet Take 1 tablet by mouth once daily 90 tablet 0  . glipiZIDE  (GLUCOTROL) 5 MG tablet Take 1 tablet (5 mg total) by mouth daily before breakfast. 90 tablet 3  . glucose blood (ACCU-CHEK GUIDE) test strip Use to check blood sugar twice daily. E11.42 100 each 11  . Insulin Pen Needle (B-D UF III MINI PEN NEEDLES) 31G X 5 MM MISC Use as instructed. Inject into the skin once nightly. 100 each 6  . metFORMIN (GLUCOPHAGE) 1000 MG tablet Take 1 tablet (1,000 mg total) by mouth 2 (two) times daily with a meal. 180 tablet 3  . fenofibrate (TRICOR) 145 MG tablet Take 1 tablet (145 mg total) by mouth daily. 90 tablet 1   No current facility-administered medications on file prior to visit.    No Known Allergies  Family History  Problem Relation Age of Onset  . Diabetes Mother   . Hypertension Mother   . Hypertension Father   . Diabetes Father   . Colon cancer Neg Hx   . Colon polyps Neg Hx   . Esophageal cancer Neg Hx   . Rectal cancer Neg Hx   . Stomach cancer Neg Hx     BP 124/78   Pulse 72   Ht _0  (1.778 m)   Wt 233 lb (105.7 kg)   SpO2 97%   BMI 33.43 kg/m   Review of Systems     Objective:   Physical Exam VITAL SIGNS:  See vs page GENERAL: no distress Pulses: dorsalis pedis intact bilat.   MSK: no deformity of the feet CV: no leg edema Skin:  no ulcer on the feet.  normal color and temp on the feet. Neuro: sensation is intact to touch on the feet, but decreased from normal.   Ext: there is bilateral onychomycosis of the toenails.     Lab Results  Component Value Date   HGBA1C 8.2 (A) 07/01/2020   Lab Results  Component Value Date   CREATININE 1.17 06/10/2020   BUN 21 06/10/2020   NA 138 06/10/2020   K 4.5 06/10/2020   CL 101 06/10/2020   CO2 27 06/10/2020       Assessment & Plan:  Type 2 DM: uncontrolled Memory loss: in this setting, oral rx is favored over parenteral.   Patient Instructions  check your blood sugar once a day.  vary the time of day when you check, between before the 3 meals, and at bedtime.  also  check if you have symptoms of your blood sugar being too high or  too low.  please keep a record of the readings and bring it to your next appointment here (or you can bring the meter itself).  You can write it on any piece of paper.  please call us sooner if your blood sugar goes below 70, or if you have a lot of readings over 200. Here is a new meter.  I have sent a prescription to your pharmacy, to add "Jardiance." Please continue the same other diabetes medications. Please come back for a follow-up appointment in 6 weeks.         .                  .                 .          (    ).       .              70          200.   .         "".       .        6 .

## 2020-07-01 NOTE — Patient Instructions (Addendum)
check your blood sugar once a day.  vary the time of day when you check, between before the 3 meals, and at bedtime.  also check if you have symptoms of your blood sugar being too high or too low.  please keep a record of the readings and bring it to your next appointment here (or you can bring the meter itself).  You can write it on any piece of paper.  please call us sooner if your blood sugar goes below 70, or if you have a lot of readings over 200. Here is a new meter.  I have sent a prescription to your pharmacy, to add "Jardiance." Please continue the same other diabetes medications. Please come back for a follow-up appointment in 6 weeks.         .                  .                 .          (    ).       .              70          200.   .         "".       .        6 .

## 2020-07-03 ENCOUNTER — Other Ambulatory Visit: Payer: Self-pay | Admitting: Nurse Practitioner

## 2020-07-03 DIAGNOSIS — E1142 Type 2 diabetes mellitus with diabetic polyneuropathy: Secondary | ICD-10-CM

## 2020-07-03 DIAGNOSIS — U071 COVID-19: Secondary | ICD-10-CM

## 2020-07-07 ENCOUNTER — Other Ambulatory Visit: Payer: Self-pay | Admitting: Nurse Practitioner

## 2020-07-07 DIAGNOSIS — U071 COVID-19: Secondary | ICD-10-CM

## 2020-07-09 ENCOUNTER — Telehealth: Payer: Self-pay | Admitting: Nurse Practitioner

## 2020-07-09 NOTE — Telephone Encounter (Signed)
Patient dropped off Paperwork for [employer] and for Disability to be filled out by PCP. Patient rejected appt  Offered.

## 2020-07-09 NOTE — Telephone Encounter (Signed)
Noted. Received the form. Will call patient when ready.

## 2020-07-14 NOTE — Progress Notes (Signed)
Your information has been sent to [employer]. prior authorization submitted via covermymeds.  Waiting response.  Key: [id number] - [place] Case ID: [id number] - Rx #: [id number] Need help? Call us at [phone] Status Sent to [place] 5MG  tablets Form [employer] Electronic Prior Authorization Form [id number] [place])

## 2020-08-04 ENCOUNTER — Encounter (HOSPITAL_COMMUNITY): Payer: Self-pay | Admitting: Psychiatry

## 2020-08-04 ENCOUNTER — Other Ambulatory Visit: Payer: Self-pay

## 2020-08-04 ENCOUNTER — Ambulatory Visit (INDEPENDENT_AMBULATORY_CARE_PROVIDER_SITE_OTHER): Payer: Medicaid Other | Admitting: Psychiatry

## 2020-08-04 VITALS — Wt 233.0 lb

## 2020-08-04 DIAGNOSIS — F419 Anxiety disorder, unspecified: Secondary | ICD-10-CM

## 2020-08-04 DIAGNOSIS — F331 Major depressive disorder, recurrent, moderate: Secondary | ICD-10-CM

## 2020-08-04 MED ORDER — DULOXETINE HCL 20 MG PO CPEP: ORAL_CAPSULE | ORAL | 1 refills | Status: DC
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
## 2020-08-12 ENCOUNTER — Other Ambulatory Visit: Payer: Self-pay

## 2020-08-12 ENCOUNTER — Ambulatory Visit (INDEPENDENT_AMBULATORY_CARE_PROVIDER_SITE_OTHER): Payer: Medicaid Other | Admitting: Endocrinology

## 2020-08-12 ENCOUNTER — Encounter: Payer: Self-pay | Admitting: Endocrinology

## 2020-08-12 VITALS — BP 148/62 | HR 68 | Wt 225.0 lb

## 2020-08-12 DIAGNOSIS — E1142 Type 2 diabetes mellitus with diabetic polyneuropathy: Secondary | ICD-10-CM

## 2020-08-12 LAB — POCT GLYCOSYLATED HEMOGLOBIN (HGB A1C): Hemoglobin A1C: 7.1 % — AB (ref 4.0–5.6)

## 2020-08-12 MED ORDER — GLIPIZIDE 5 MG PO TABS: 2.5000 mg | ORAL_TABLET | Freq: Every day | ORAL | 3 refills | Status: DC

## 2020-08-12 MED ORDER — RYBELSUS 3 MG PO TABS: 3.0000 mg | ORAL_TABLET | Freq: Every day | ORAL | 11 refills | Status: DC

## 2020-08-12 NOTE — Patient Instructions (Addendum)
check your blood sugar once a day.  vary the time of day when you check, between before the 3 meals, and at bedtime.  also check if you have symptoms of your blood sugar being too high or too low.  please keep a record of the readings and bring it to your next appointment here (or you can bring the meter itself).  You can write it on any piece of paper.  please call us sooner if your blood sugar goes below 70, or if you have a lot of readings over 200. Here is a new meter.  I have sent a prescription to your pharmacy, to reduce the glipizide, and to change Tradjenta to "Rybelsus."  Our goals are an A1c below 7%, and off the glipizide.  Please come back for a follow-up appointment in 2 months.           .                  .                 .          (    ).       .              70          200.   Marland Kitchen           glipizide  Tradjenta  Rybelsus.   A1c   7 ?    Marland Kitchen       Marland Kitchen
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
## 2020-08-13 ENCOUNTER — Ambulatory Visit: Payer: Medicaid Other | Attending: Nurse Practitioner | Admitting: Nurse Practitioner

## 2020-08-13 ENCOUNTER — Encounter: Payer: Self-pay | Admitting: Nurse Practitioner

## 2020-08-13 ENCOUNTER — Other Ambulatory Visit: Payer: Self-pay

## 2020-08-13 VITALS — BP 133/75 | HR 75 | Temp 97.5°F | Ht 70.0 in | Wt 228.0 lb

## 2020-08-13 DIAGNOSIS — E119 Type 2 diabetes mellitus without complications: Secondary | ICD-10-CM | POA: Diagnosis not present

## 2020-08-13 DIAGNOSIS — E890 Postprocedural hypothyroidism: Secondary | ICD-10-CM

## 2020-08-13 DIAGNOSIS — Z79890 Hormone replacement therapy (postmenopausal): Secondary | ICD-10-CM | POA: Diagnosis not present

## 2020-08-13 DIAGNOSIS — Z791 Long term (current) use of non-steroidal anti-inflammatories (NSAID): Secondary | ICD-10-CM | POA: Diagnosis not present

## 2020-08-13 DIAGNOSIS — Z7982 Long term (current) use of aspirin: Secondary | ICD-10-CM | POA: Diagnosis not present

## 2020-08-13 DIAGNOSIS — Z794 Long term (current) use of insulin: Secondary | ICD-10-CM | POA: Insufficient documentation

## 2020-08-13 DIAGNOSIS — E782 Mixed hyperlipidemia: Secondary | ICD-10-CM | POA: Insufficient documentation

## 2020-08-13 DIAGNOSIS — Z85850 Personal history of malignant neoplasm of thyroid: Secondary | ICD-10-CM | POA: Insufficient documentation

## 2020-08-13 DIAGNOSIS — Z79899 Other long term (current) drug therapy: Secondary | ICD-10-CM | POA: Insufficient documentation

## 2020-08-13 DIAGNOSIS — Z130 Encounter for screening for diseases of the blood and blood-forming organs and certain disorders involving the immune mechanism: Secondary | ICD-10-CM

## 2020-08-13 DIAGNOSIS — Z7984 Long term (current) use of oral hypoglycemic drugs: Secondary | ICD-10-CM | POA: Diagnosis not present

## 2020-08-13 DIAGNOSIS — E785 Hyperlipidemia, unspecified: Secondary | ICD-10-CM | POA: Diagnosis not present

## 2020-08-13 LAB — GLUCOSE, POCT (MANUAL RESULT ENTRY): POC Glucose: 249 mg/dl — AB (ref 70–99)

## 2020-08-13 MED ORDER — FENOFIBRATE 145 MG PO TABS: 145.0000 mg | ORAL_TABLET | Freq: Every day | ORAL | 1 refills | Status: DC

## 2020-08-13 MED ORDER — LEVOTHYROXINE SODIUM 125 MCG PO TABS: 125.0000 ug | ORAL_TABLET | Freq: Every day | ORAL | 1 refills | Status: DC
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
## 2020-08-14 ENCOUNTER — Encounter: Payer: Self-pay | Admitting: Nurse Practitioner

## 2020-08-14 LAB — LIPID PANEL
Chol/HDL Ratio: 4.5 ratio (ref 0.0–5.0)
Cholesterol, Total: 149 mg/dL (ref 100–199)
HDL: 33 mg/dL — ABNORMAL LOW (ref >39)
LDL Chol Calc (NIH): 67 mg/dL (ref 0–99)
Triglycerides: 306 mg/dL — ABNORMAL HIGH (ref 0–149)
VLDL Cholesterol Cal: 49 mg/dL — ABNORMAL HIGH (ref 5–40)

## 2020-08-14 LAB — CBC
Hematocrit: 41.7 % (ref 37.5–51.0)
Hemoglobin: 12.8 g/dL — ABNORMAL LOW (ref 13.0–17.7)
MCH: 25.2 pg — ABNORMAL LOW (ref 26.6–33.0)
MCHC: 30.7 g/dL — ABNORMAL LOW (ref 31.5–35.7)
MCV: 82 fL (ref 79–97)
Platelets: 271 10*3/uL (ref 150–450)
RBC: 5.07 x10E6/uL (ref 4.14–5.80)
RDW: 12.3 % (ref 11.6–15.4)
WBC: 8.5 10*3/uL (ref 3.4–10.8)

## 2020-08-14 LAB — TSH: TSH: 2.36 u[IU]/mL (ref 0.450–4.500)

## 2020-08-14 MED ORDER — OMEGA-3-ACID ETHYL ESTERS 1 G PO CAPS: 1.0000 g | ORAL_CAPSULE | Freq: Two times a day (BID) | ORAL | 1 refills | Status: DC

## 2020-08-17 DIAGNOSIS — E119 Type 2 diabetes mellitus without complications: Secondary | ICD-10-CM | POA: Diagnosis not present

## 2020-08-17 DIAGNOSIS — H52203 Unspecified astigmatism, bilateral: Secondary | ICD-10-CM | POA: Diagnosis not present

## 2020-08-17 DIAGNOSIS — H524 Presbyopia: Secondary | ICD-10-CM | POA: Diagnosis not present

## 2020-08-17 DIAGNOSIS — H25813 Combined forms of age-related cataract, bilateral: Secondary | ICD-10-CM | POA: Diagnosis not present

## 2020-08-17 DIAGNOSIS — H5213 Myopia, bilateral: Secondary | ICD-10-CM | POA: Diagnosis not present

## 2020-08-17 LAB — HM DIABETES EYE EXAM

## 2020-08-24 ENCOUNTER — Other Ambulatory Visit: Payer: Self-pay

## 2020-08-24 ENCOUNTER — Encounter (HOSPITAL_COMMUNITY): Payer: Self-pay | Admitting: Psychiatry

## 2020-08-24 ENCOUNTER — Telehealth (INDEPENDENT_AMBULATORY_CARE_PROVIDER_SITE_OTHER): Payer: Medicaid Other | Admitting: Psychiatry

## 2020-08-24 VITALS — Wt 228.0 lb

## 2020-08-24 DIAGNOSIS — F4310 Post-traumatic stress disorder, unspecified: Secondary | ICD-10-CM

## 2020-08-24 DIAGNOSIS — F419 Anxiety disorder, unspecified: Secondary | ICD-10-CM

## 2020-08-24 DIAGNOSIS — F331 Major depressive disorder, recurrent, moderate: Secondary | ICD-10-CM | POA: Diagnosis not present

## 2020-08-24 MED ORDER — SERTRALINE HCL 50 MG PO TABS: ORAL_TABLET | ORAL | 0 refills | Status: DC

## 2020-08-24 MED ORDER — QUETIAPINE FUMARATE 50 MG PO TABS: 50.0000 mg | ORAL_TABLET | Freq: Every day | ORAL | 0 refills | Status: DC
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
## 2020-09-22 ENCOUNTER — Other Ambulatory Visit: Payer: Self-pay

## 2020-09-22 ENCOUNTER — Encounter (HOSPITAL_COMMUNITY): Payer: Self-pay | Admitting: Psychiatry

## 2020-09-22 ENCOUNTER — Telehealth (INDEPENDENT_AMBULATORY_CARE_PROVIDER_SITE_OTHER): Payer: Medicaid Other | Admitting: Psychiatry

## 2020-09-22 VITALS — Wt 228.0 lb

## 2020-09-22 DIAGNOSIS — F4310 Post-traumatic stress disorder, unspecified: Secondary | ICD-10-CM | POA: Diagnosis not present

## 2020-09-22 DIAGNOSIS — R413 Other amnesia: Secondary | ICD-10-CM | POA: Diagnosis not present

## 2020-09-22 DIAGNOSIS — F331 Major depressive disorder, recurrent, moderate: Secondary | ICD-10-CM

## 2020-09-22 MED ORDER — QUETIAPINE FUMARATE 50 MG PO TABS: 50.0000 mg | ORAL_TABLET | Freq: Every day | ORAL | 1 refills | Status: DC

## 2020-09-22 MED ORDER — SERTRALINE HCL 100 MG PO TABS: 100.0000 mg | ORAL_TABLET | Freq: Every day | ORAL | 1 refills | Status: DC
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
## 2020-09-23 ENCOUNTER — Telehealth (HOSPITAL_COMMUNITY): Payer: Self-pay | Admitting: *Deleted

## 2020-09-23 NOTE — Telephone Encounter (Signed)
Pt referral faxed to [facility] for memory impairment.

## 2020-10-13 ENCOUNTER — Other Ambulatory Visit: Payer: Self-pay

## 2020-10-13 ENCOUNTER — Ambulatory Visit (INDEPENDENT_AMBULATORY_CARE_PROVIDER_SITE_OTHER): Payer: Medicaid Other | Admitting: Endocrinology

## 2020-10-13 VITALS — BP 132/70 | HR 79 | Ht 70.0 in | Wt 222.8 lb

## 2020-10-13 DIAGNOSIS — E119 Type 2 diabetes mellitus without complications: Secondary | ICD-10-CM

## 2020-10-13 DIAGNOSIS — E1122 Type 2 diabetes mellitus with diabetic chronic kidney disease: Secondary | ICD-10-CM

## 2020-10-13 DIAGNOSIS — E1142 Type 2 diabetes mellitus with diabetic polyneuropathy: Secondary | ICD-10-CM

## 2020-10-13 DIAGNOSIS — N183 Chronic kidney disease, stage 3 unspecified: Secondary | ICD-10-CM

## 2020-10-13 LAB — POCT GLYCOSYLATED HEMOGLOBIN (HGB A1C): Hemoglobin A1C: 8.1 % — AB (ref 4.0–5.6)

## 2020-10-13 MED ORDER — METFORMIN HCL 1000 MG PO TABS: 1000.0000 mg | ORAL_TABLET | Freq: Two times a day (BID) | ORAL | 3 refills | Status: DC

## 2020-10-13 MED ORDER — GLIPIZIDE 5 MG PO TABS: 5.0000 mg | ORAL_TABLET | Freq: Every day | ORAL | 3 refills | Status: DC

## 2020-10-13 NOTE — Patient Instructions (Addendum)
check your blood sugar once a day.  vary the time of day when you check, between before the 3 meals, and at bedtime.  also check if you have symptoms of your blood sugar being too high or too low.  please keep a record of the readings and bring it to your next appointment here (or you can bring the meter itself).  You can write it on any piece of paper.  please call us sooner if your blood sugar goes below 70, or if you have a lot of readings over 200.   I have sent a prescription to your pharmacy, for the metformin and the glipizide.  Please come back for a follow-up appointment in 2 months.          .                  .                 .          (    ).       .              70          200.             Marland Kitchen       Marland Kitchen
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
## 2020-10-19 ENCOUNTER — Ambulatory Visit (INDEPENDENT_AMBULATORY_CARE_PROVIDER_SITE_OTHER): Payer: Self-pay | Admitting: Diagnostic Neuroimaging

## 2020-10-19 ENCOUNTER — Encounter: Payer: Self-pay | Admitting: Diagnostic Neuroimaging

## 2020-10-19 VITALS — BP 112/70 | HR 72 | Ht 70.0 in | Wt 224.0 lb

## 2020-10-19 DIAGNOSIS — R413 Other amnesia: Secondary | ICD-10-CM

## 2020-10-19 NOTE — Progress Notes (Signed)
[facility]  PATIENT: Joe Wallace DOB: 02/04/1961  REFERRING CLINICIAN: Kathlee Nations, MD  HISTORY FROM: patient (via translator) REASON FOR VISIT: follow up     HISTORICAL  CHIEF COMPLAINT:  Chief Complaint  Patient presents with  . Memory Loss    Rm 7  interpreter- [facility]  Int referral for increased memory loss    HISTORY OF PRESENT ILLNESS:   UPDATE (10/19/20, VRP): Since last visit, doing about the same; continues with memory loss, depression, fatigue, poor appetite. Now seeing Dr. Adele Schilder for depression / PTSD. Memory and mood issues both are getting worse.   UPDATE (07/03/18, VRP): Since last visit, doing worse. Had CT head which was unremarkable. Symptoms are persistent. Severity is moderate. No alleviating or aggravating factors. Not able to sleep. More stress and anxiety. Patient is applying for [place] citizenship.   PRIOR HPI (06/17/18): 60 year old male here for evaluation of memory loss.  For past 1 year patient has had gradual onset progressive short-term memory loss, anxiety, anger outburst.  He is forgetting how to take his medications and forgetting appointments.  Patient moved to the [place] from [place] around 5 years ago, was living in [place], renting several rooms for him in his family.  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  About 1.5 years ago he found a new home for him at his family in [place], but he does not like this change and feels that his new home is like a "jail" and he does not like this.  Then about 1 year ago he has been having more anxiety, short-term memory loss, stress.  His wife is also sick and he worries about her.  Patient also diagnosed with hearing loss and was prescribed hearing aids, but he is not able to afford these.    REVIEW OF SYSTEMS: Full 14 system review of systems performed and  negative with exception of: memory loss dizziness fatigue fever hearing loss snoring.   ALLERGIES: No Known Allergies  HOME MEDICATIONS: Outpatient Medications Prior to Visit  Medication Sig Dispense Refill  . Accu-Chek FastClix Lancets MISC Use to check blood sugar twice daily. E11.42 102 each 11  . acetaminophen (TYLENOL) 500 MG tablet Take 1 tablet (500 mg total) by mouth every 6 (six) hours as needed. 30 tablet 0  . aspirin 81 MG tablet Take 81 mg by mouth daily.    Marland Kitchen atorvastatin (LIPITOR) 40 MG tablet Take 1 tablet (40 mg total) by mouth daily. 90 tablet 3  . Blood Glucose Monitoring Suppl (ACCU-CHEK GUIDE ME) w/Device KIT 1 kit by Does not apply route 2 (two) times daily. Use to check blood sugar twice daily. E11.42 1 kit 0  . empagliflozin (JARDIANCE) 25 MG TABS tablet Take 25 mg by mouth daily.    . fenofibrate (TRICOR) 145 MG tablet Take 1 tablet (145 mg total) by mouth daily. 90 tablet 1  . glipiZIDE (GLUCOTROL) 5 MG tablet Take 1 tablet (5 mg total) by mouth daily before breakfast. 90 tablet 3  . glucose blood (ACCU-CHEK GUIDE) test strip Use to check blood sugar twice daily. E11.42 100 each 11  . Insulin Pen Needle (B-D UF III MINI PEN NEEDLES) 31G X 5 MM MISC Use as instructed. Inject into the skin once nightly. 100 each 6  . levothyroxine (EUTHYROX) 125 MCG tablet Take 1 tablet (125 mcg total) by  mouth daily. 90 tablet 1  . metFORMIN (GLUCOPHAGE) 1000 MG tablet Take 1 tablet (1,000 mg total) by mouth 2 (two) times daily with a meal. 180 tablet 3  . QUEtiapine (SEROQUEL) 50 MG tablet Take 1 tablet (50 mg total) by mouth at bedtime. 30 tablet 1  . sertraline (ZOLOFT) 100 MG tablet Take 1 tablet (100 mg total) by mouth daily. 30 tablet 1  . Vitamin D, Ergocalciferol, (DRISDOL) 1.25 MG (50000 UNIT) CAPS capsule     . celecoxib (CELEBREX) 200 MG capsule Take 1 capsule (200 mg total) by mouth 2 (two) times daily as needed. (Patient not taking: Reported on 10/19/2020) 60 capsule 6  .  cyclobenzaprine (FLEXERIL) 5 MG tablet Take 1 tablet (5 mg total) by mouth 3 (three) times daily as needed for muscle spasms. (Patient not taking: Reported on 10/19/2020) 30 tablet 1  . diclofenac Sodium (VOLTAREN) 1 % GEL Apply 4 g topically 4 (four) times daily as needed. (Patient not taking: Reported on 10/19/2020) 500 g 6  . omega-3 acid ethyl esters (LOVAZA) 1 g capsule Take 1 capsule (1 g total) by mouth 2 (two) times daily. (Patient not taking: Reported on 10/19/2020) 180 capsule 1   No facility-administered medications prior to visit.    PAST MEDICAL HISTORY: Past Medical History:  Diagnosis Date  . Arthritis   . 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 Depression   . Diabetes mellitus without complication ([place])   . Headache   . Hyperlipidemia   . Memory loss   . Thyroid neoplasm   . Visual disorder    LEFT EYE    PAST SURGICAL HISTORY: Past Surgical History:  Procedure Laterality Date  . head trauma     L sided fragment from war.  Marland Kitchen SHOULDER ARTHROSCOPY    . THYROIDECTOMY N/A 08/13/2015   Procedure: TOTAL THYROIDECTOMY;  Surgeon: Armandina Gemma, MD;  Location: [facility];  Service: General;  Laterality: N/A;    FAMILY HISTORY: Family History  Problem Relation Age of Onset  . Diabetes Mother   . Hypertension Mother   . Hypertension Father   . Diabetes Father   . Colon cancer Neg Hx   . Colon polyps Neg Hx   . Esophageal cancer Neg Hx   . Rectal cancer Neg Hx   . Stomach cancer Neg Hx     SOCIAL HISTORY: Social History   Socioeconomic History  . Marital status: Married    Spouse name: Not on file  . Number of children: Not on file  . Years of education: Not on file  . Highest education level: Not on file  Occupational History  . Not on file  Tobacco Use  . Smoking status: Never Smoker  . Smokeless tobacco: Never Used  Vaping Use  . Vaping Use: Never used  Substance and Sexual Activity  . Alcohol use: No  . Drug use: No  . Sexual activity: Yes   Other Topics Concern  . Not on file  Social History Narrative   Lives home with wife and 7 boyss, [place] living [place] in [place].  Does not work, SSI.  Education: middle school.  Soda once daily.    Social Determinants of Health   Financial Resource Strain: Not on file  Food Insecurity: Not on file  Transportation Needs: Not on file  Physical Activity: Not on file  Stress: Not on file  Social Connections: Not on file  Intimate Partner Violence: Not on file  PHYSICAL EXAM  GENERAL EXAM/CONSTITUTIONAL: Vitals:  Vitals:   10/19/20 1021  BP: 112/70  Pulse: 72  Weight: 224 lb (101.6 kg)  Height: _0  (1.778 m)   Body mass index is 32.14 kg/m. Wt Readings from Last 3 Encounters:  10/19/20 224 lb (101.6 kg)  10/13/20 222 lb 12.8 oz (101.1 kg)  09/22/20 228 lb (103.4 kg)    Patient is in no distress; well developed, nourished and groomed; neck is supple  DEPRESSED AFFECT  CARDIOVASCULAR:  Examination of carotid arteries is normal; no carotid bruits  Regular rate and rhythm, no murmurs  Examination of peripheral vascular system by observation and palpation is normal  EYES:  Ophthalmoscopic exam of optic discs and posterior segments is normal; no papilledema or hemorrhages No exam data present  MUSCULOSKELETAL:  Gait, strength, tone, movements noted in Neurologic exam below  NEUROLOGIC: MENTAL STATUS:  MMSE - Mini Mental State Exam 06/17/2018  Orientation to time 5  Orientation to Place 4  Registration 3  Attention/ Calculation 5  Recall 0  Language- name 2 objects 2  Language- repeat 1  Language- follow 3 step command 2  Language- read & follow direction 1  Write a sentence 1  Copy design 1  Total score 25    awake, alert, oriented to person, place and time  recent and remote memory intact  normal attention and concentration  language fluent, comprehension intact, naming intact  fund of knowledge appropriate  CRANIAL NERVE:   2nd -  no papilledema on fundoscopic exam  2nd, 3rd, 4th, 6th - pupils equal and reactive to light, visual fields full to confrontation, extraocular muscles intact, no nystagmus  5th - facial sensation symmetric  7th - facial strength symmetric  8th - hearing intact  9th - palate elevates symmetrically, uvula midline  11th - shoulder shrug symmetric  12th - tongue protrusion midline  MOTOR:   normal bulk and tone, full strength in the BUE, BLE  SENSORY:   normal and symmetric to light touch, temperature, vibration  COORDINATION:   finger-nose-finger, fine finger movements normal  REFLEXES:   deep tendon reflexes present and symmetric  GAIT/STATION:   narrow based gait    DIAGNOSTIC DATA (LABS, IMAGING, TESTING) - I reviewed patient records, labs, notes, testing and imaging myself where available.  Lab Results  Component Value Date   WBC 8.5 08/13/2020   HGB 12.8 (L) 08/13/2020   HCT 41.7 08/13/2020   MCV 82 08/13/2020   PLT 271 08/13/2020      Component Value Date/Time   NA 138 06/10/2020 1526   K 4.5 06/10/2020 1526   CL 101 06/10/2020 1526   CO2 27 06/10/2020 1526   GLUCOSE 270 (H) 06/10/2020 1526   GLUCOSE 219 (H) 06/29/2016 1211   BUN 21 06/10/2020 1526   CREATININE 1.17 06/10/2020 1526   CREATININE 1.24 06/29/2016 1211   CALCIUM 9.8 06/10/2020 1526   PROT 7.7 06/10/2020 1526   ALBUMIN 4.6 06/10/2020 1526   AST 12 06/10/2020 1526   ALT 16 06/10/2020 1526   ALKPHOS 61 06/10/2020 1526   BILITOT 0.3 06/10/2020 1526   GFRNONAA 67 06/10/2020 1526   GFRNONAA 64 06/29/2016 1211   GFRAA 77 06/10/2020 1526   GFRAA 74 06/29/2016 1211   Lab Results  Component Value Date   CHOL 149 08/13/2020   HDL 33 (L) 08/13/2020   LDLCALC 67 08/13/2020   TRIG 306 (H) 08/13/2020   CHOLHDL 4.5 08/13/2020   Lab Results  Component Value  Date   HGBA1C 8.1 (A) 10/13/2020   Lab Results  Component Value Date   VITAMINB12 234 01/01/2019   Lab Results  Component  Value Date   TSH 2.360 08/13/2020    12/05/13 xray foreign body eye [I reviewed images myself and agree with interpretation. -VRP]  - negative  06/18/18 CT head [I reviewed images myself and agree with interpretation. -VRP]  - negative     ASSESSMENT AND PLAN  60 y.o. year old male here with short-term memory loss, anxiety, stress, in the setting of new living environment, transitioning to disability, and other family stress (since ~2018).  Most likely represents mild cognitive impairment versus stress related memory impairment.    Dx:  1. Memory loss      PLAN:  MEMORY LOSS (ongoing, likely related to depression, PTSD, stress) - refer to neuropsychology testing (eval for pseudo-dementia of depression, MCI, or other cause) - continue treatments for hypothyroidism and diabetes - safety and supervision issues reviewed - follow up hearing loss / hearing aids  ANXIETY / DEPRESSION / INSOMNIA / PTSD - continue psychiatry treatments (Dr. Adele Schilder)  Orders Placed This Encounter  Procedures  . Ambulatory referral to Neuropsychology   Return return to psychiatry, for return to PCP.    Penni Bombard, MD 6/78/9381, 01:75 AM Certified in Neurology, Neurophysiology and Neuroimaging  [facility] [address] [place],  [address] [phone]

## 2020-10-19 NOTE — Patient Instructions (Signed)
MEMORY LOSS (ongoing, likely related to depression, PTSD, stress) - refer to neuropsychology testing - treat hypothyroidism and diabetes (per PCP) - safety and supervision issues reviewed - follow up hearing loss / hearing aids  ANXIETY / DEPRESSION / INSOMNIA - continue psychiatry treatments (Dr. Adele Schilder)

## 2020-10-25 ENCOUNTER — Other Ambulatory Visit: Payer: Self-pay | Admitting: Nurse Practitioner

## 2020-11-03 ENCOUNTER — Other Ambulatory Visit: Payer: Self-pay | Admitting: Nurse Practitioner

## 2020-11-03 NOTE — Telephone Encounter (Signed)
   Notes to clinic Historical Provider  

## 2020-11-04 ENCOUNTER — Encounter (HOSPITAL_COMMUNITY): Payer: Self-pay

## 2020-11-11 ENCOUNTER — Encounter (HOSPITAL_COMMUNITY): Payer: Self-pay | Admitting: Psychiatry

## 2020-11-11 ENCOUNTER — Other Ambulatory Visit: Payer: Self-pay

## 2020-11-11 ENCOUNTER — Telehealth (INDEPENDENT_AMBULATORY_CARE_PROVIDER_SITE_OTHER): Payer: Self-pay | Admitting: Psychiatry

## 2020-11-11 VITALS — Wt 224.0 lb

## 2020-11-11 DIAGNOSIS — F4310 Post-traumatic stress disorder, unspecified: Secondary | ICD-10-CM

## 2020-11-11 DIAGNOSIS — F331 Major depressive disorder, recurrent, moderate: Secondary | ICD-10-CM

## 2020-11-11 DIAGNOSIS — R413 Other amnesia: Secondary | ICD-10-CM

## 2020-11-11 MED ORDER — SERTRALINE HCL 100 MG PO TABS: 100.0000 mg | ORAL_TABLET | Freq: Every day | ORAL | 1 refills | Status: DC

## 2020-11-11 MED ORDER — QUETIAPINE FUMARATE 100 MG PO TABS
100.0000 mg | ORAL_TABLET | Freq: Every day | ORAL | 1 refills | Status: DC
Start: 2020-11-11 — End: 2022-02-17
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
## 2020-11-15 ENCOUNTER — Telehealth: Payer: Self-pay | Admitting: Endocrinology

## 2020-11-15 ENCOUNTER — Other Ambulatory Visit: Payer: Self-pay

## 2020-11-15 ENCOUNTER — Ambulatory Visit: Payer: Self-pay | Attending: Nurse Practitioner | Admitting: Nurse Practitioner

## 2020-11-15 ENCOUNTER — Encounter: Payer: Self-pay | Admitting: Nurse Practitioner

## 2020-11-15 DIAGNOSIS — E782 Mixed hyperlipidemia: Secondary | ICD-10-CM

## 2020-11-15 DIAGNOSIS — E890 Postprocedural hypothyroidism: Secondary | ICD-10-CM

## 2020-11-15 DIAGNOSIS — E785 Hyperlipidemia, unspecified: Secondary | ICD-10-CM

## 2020-11-15 MED ORDER — OMEGA-3-ACID ETHYL ESTERS 1 G PO CAPS
1.0000 g | ORAL_CAPSULE | Freq: Two times a day (BID) | ORAL | 1 refills | Status: DC
Start: 2020-11-15 — End: 2022-02-11

## 2020-11-15 NOTE — Telephone Encounter (Signed)
Please advise 

## 2020-11-15 NOTE — Telephone Encounter (Signed)
Patient's son Earlie Server (Patient was present) called re: Patient's insurance does not cover Jardiance. Patient cannot afford Jardiance. Patient requests an affordable alternative medication (Patient has an appointment with his insurance next month to discuss the above) be sent to:  [place] [address] Phone:  [phone]  Fax:  [phone]     Patient requests to be called at ph# [phone] isf a RX for an alternative to Jardiance is sent to the [facility] listed above

## 2020-11-15 NOTE — Progress Notes (Signed)
Virtual Visit via Telephone Note Due to national recommendations of social distancing due to [place] 19, telehealth visit is felt to be most appropriate for this patient at this time.  I discussed the limitations, risks, security and privacy concerns of performing an evaluation and management service by telephone and the availability of in person appointments. I also discussed with the patient that there may be a patient responsible charge related to this service. The patient expressed understanding and agreed to proceed.    I connected with Joe Wallace on 11/15/20  at   3:30 PM EST  EDT by telephone and verified that I am speaking with the correct person using two identifiers.   Consent I discussed the limitations, risks, security and privacy concerns of performing an evaluation and management service by telephone and the availability of in person appointments. I also discussed with the patient that there may be a patient responsible charge related to this service. The patient expressed understanding and agreed to proceed.   Location of Patient: Private  Residence    Location of Provider: [place] and [place] participating in Telemedicine visit: [name] FNP-BC [place] ID# [email or web address] [phone]   History of Present Illness: Telemedicine visit for: Follow Up He has a past medical history of Arthritis, Cancer(2017), Depression, DM2, Headache, Hyperlipidemia, Memory loss, Thyroid neoplasm, and Visual disorder. Seeing DR. Afreen [facility]) and Dr. Leta Baptist (Neuro) for memory loss likely related to depression, PTSD, stress. Currently taking seroquel and sertraline 100 mg daily for depression and also endorses insomnia.    Hypothyroidism Thyroid level not due at this time. He does endorse symptoms of fatigue and denies any symptoms of hypo or hyperthyroidism. He is currently taking euthryox 125 mg daily.  Lab Results   Component Value Date   TSH 2.360 08/13/2020   DM He is currently seeing Endocrinology for his poorly controlled diabetes. Taking all medications as prescribed: metformin 1000 mg BID, glipizide 5 mg daily, (states Jardiance was $600 and he could not afford).  LDL at goal with atorvastatin 40 mg daily, lovaza 1 gm BID, and tricor 145 mg daily.  Lab Results  Component Value Date   HGBA1C 8.1 (A) 10/13/2020   Lab Results  Component Value Date   LDLCALC 67 08/13/2020    Past Medical History:  Diagnosis Date  . Arthritis   . 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 Depression   . Diabetes mellitus without complication ([place])   . Headache   . Hyperlipidemia   . Memory loss   . Thyroid neoplasm   . Visual disorder    LEFT EYE    Past Surgical History:  Procedure Laterality Date  . head trauma     L sided fragment from war.  Marland Kitchen SHOULDER ARTHROSCOPY    . THYROIDECTOMY N/A 08/13/2015   Procedure: TOTAL THYROIDECTOMY;  Surgeon: Armandina Gemma, MD;  Location: [facility];  Service: General;  Laterality: N/A;    Family History  Problem Relation Age of Onset  . Diabetes Mother   . Hypertension Mother   . Hypertension Father   . Diabetes Father   . Colon cancer Neg Hx   . Colon polyps Neg Hx   . Esophageal cancer Neg Hx   . Rectal cancer Neg Hx   . Stomach cancer Neg Hx     Social History   Socioeconomic History  . Marital status: Married  Spouse name: Not on file  . Number of children: Not on file  . Years of education: Not on file  . Highest education level: Not on file  Occupational History  . Not on file  Tobacco Use  . Smoking status: Never Smoker  . Smokeless tobacco: Never Used  Vaping Use  . Vaping Use: Never used  Substance and Sexual Activity  . Alcohol use: No  . Drug use: No  . Sexual activity: Yes  Other Topics Concern  . Not on file  Social History Narrative   Lives home with wife and 7 boyss, [place].  Does not work,  [employer].  Education: middle school.  Soda once daily.    Social Determinants of Health   Financial Resource Strain: Not on file  Food Insecurity: Not on file  Transportation Needs: Not on file  Physical Activity: Not on file  Stress: Not on file  Social Connections: Not on file     Observations/Objective: Awake, alert and oriented x 3   Review of Systems  Constitutional: Positive for malaise/fatigue. Negative for fever and weight loss.  HENT: Negative.  Negative for nosebleeds.   Eyes: Negative.  Negative for blurred vision, double vision and photophobia.  Respiratory: Negative.  Negative for cough and shortness of breath.   Cardiovascular: Negative.  Negative for chest pain, palpitations and leg swelling.  Gastrointestinal: Negative.  Negative for heartburn, nausea and vomiting.  Musculoskeletal: Negative.  Negative for myalgias.  Neurological: Negative.  Negative for dizziness, focal weakness, seizures and headaches.  Psychiatric/Behavioral: Positive for depression. Negative for suicidal ideas. The patient is nervous/anxious and has insomnia.     Assessment and Plan: Joe Wallace was seen today for follow-up.  Diagnoses and all orders for this visit:  Postoperative hypothyroidism  Mixed hypertriglyceridemia -     omega-3 acid ethyl esters (LOVAZA) 1 g capsule; Take 1 capsule (1 g total) by mouth 2 (two) times daily.  Dyslipidemia, goal LDL below 70 -     omega-3 acid ethyl esters (LOVAZA) 1 g capsule; Take 1 capsule (1 g total) by mouth 2 (two) times daily.     Follow Up Instructions Return in about 3 months (around 02/12/2021).     I discussed the assessment and treatment plan with the patient. The patient was provided an opportunity to ask questions and all were answered. The patient agreed with the plan and demonstrated an understanding of the instructions.   The patient was advised to call back or seek an in-person evaluation if the symptoms worsen or if the condition  fails to improve as anticipated.  I provided 15 minutes of non-face-to-face time during this encounter including median intraservice time, reviewing previous notes, labs, imaging, medications and explaining diagnosis and management.  Gildardo Pounds, FNP-BC

## 2020-11-16 NOTE — Telephone Encounter (Signed)
D/c jardiance Please move up next appt to next avail.

## 2020-11-18 ENCOUNTER — Other Ambulatory Visit: Payer: Self-pay

## 2020-11-18 NOTE — Telephone Encounter (Signed)
LVM--to call the office back regarding needing to change appt to next available.

## 2020-11-19 ENCOUNTER — Ambulatory Visit (INDEPENDENT_AMBULATORY_CARE_PROVIDER_SITE_OTHER): Payer: Self-pay | Admitting: Endocrinology

## 2020-11-19 DIAGNOSIS — E1142 Type 2 diabetes mellitus with diabetic polyneuropathy: Secondary | ICD-10-CM

## 2020-11-19 MED ORDER — METFORMIN HCL 1000 MG PO TABS
1000.0000 mg | ORAL_TABLET | Freq: Two times a day (BID) | ORAL | 3 refills | Status: DC
  Filled 2021-05-04: qty 60, 30d supply, fill #0
  Filled 2021-08-10: qty 60, 30d supply, fill #1
  Filled 2021-10-10: qty 60, 30d supply, fill #0
  Filled 2021-11-10: qty 60, 30d supply, fill #1

## 2020-11-19 MED ORDER — GLIPIZIDE 5 MG PO TABS: 5.0000 mg | ORAL_TABLET | Freq: Every day | ORAL | 3 refills | Status: DC

## 2020-11-19 NOTE — Progress Notes (Signed)
Subjective:    Patient ID: Joe Wallace, male    DOB: 1961/05/21, 60 y.o.   MRN: [id number]  HPI Pt returns for f/u of diabetes mellitus: DM type: 2 Dx'ed: 3300 Complications:PN and stage 3 CRI  Therapy: 2 oral meds DKA: never Severe hypoglycemia: never Pancreatitis: never Pancreatic imaging: never SDOH: he has memory loss; he cannot afford name brand meds.   Other: he has never been on insulin.   Interval history: pt says cbg's vary from 44-210.  He is not sure what DM meds he takes, but he says 1 of them is expensive.  pt states he feels well in general.   Past Medical History:  Diagnosis Date  . Arthritis   . 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 Depression   . Diabetes mellitus without complication ([place])   . Headache   . Hyperlipidemia   . Memory loss   . Thyroid neoplasm   . Visual disorder    LEFT EYE    Past Surgical History:  Procedure Laterality Date  . head trauma     L sided fragment from war.  Marland Kitchen SHOULDER ARTHROSCOPY    . THYROIDECTOMY N/A 08/13/2015   Procedure: TOTAL THYROIDECTOMY;  Surgeon: Armandina Gemma, MD;  Location: [facility];  Service: General;  Laterality: N/A;    Social History   Socioeconomic History  . Marital status: Married    Spouse name: Not on file  . Number of children: Not on file  . Years of education: Not on file  . Highest education level: Not on file  Occupational History  . Not on file  Tobacco Use  . Smoking status: Never Smoker  . Smokeless tobacco: Never Used  Vaping Use  . Vaping Use: Never used  Substance and Sexual Activity  . Alcohol use: No  . Drug use: No  . Sexual activity: Yes  Other Topics Concern  . Not on file  Social History Narrative   Lives home with wife and 7 boyss, [place] living [place].  Does not work, SSI.  Education: middle school.  Soda once daily.    Social Determinants of Health   Financial Resource Strain: Not on file  Food Insecurity: Not on file   Transportation Needs: Not on file  Physical Activity: Not on file  Stress: Not on file  Social Connections: Not on file  Intimate Partner Violence: Not on file    Current Outpatient Medications on File Prior to Visit  Medication Sig Dispense Refill  . Accu-Chek FastClix Lancets MISC Use to check blood sugar twice daily. E11.42 102 each 11  . acetaminophen (TYLENOL) 500 MG tablet Take 1 tablet (500 mg total) by mouth every 6 (six) hours as needed. 30 tablet 0  . aspirin 81 MG tablet Take 81 mg by mouth daily.    Marland Kitchen atorvastatin (LIPITOR) 40 MG tablet Take 1 tablet (40 mg total) by mouth daily. 90 tablet 3  . Blood Glucose Monitoring Suppl (ACCU-CHEK GUIDE ME) w/Device KIT 1 kit by Does not apply route 2 (two) times daily. Use to check blood sugar twice daily. E11.42 1 kit 0  . celecoxib (CELEBREX) 200 MG capsule Take 1 capsule (200 mg total) by mouth 2 (two) times daily as needed. 60 capsule 6  . glucose blood (ACCU-CHEK GUIDE) test strip Use to check blood sugar twice daily. E11.42 100 each 11  . Insulin Pen Needle (B-D UF III MINI PEN NEEDLES) 31G X 5  MM MISC Use as instructed. Inject into the skin once nightly. 100 each 6  . levothyroxine (EUTHYROX) 125 MCG tablet Take 1 tablet (125 mcg total) by mouth daily. 90 tablet 1  . omega-3 acid ethyl esters (LOVAZA) 1 g capsule Take 1 capsule (1 g total) by mouth 2 (two) times daily. 180 capsule 1  . QUEtiapine (SEROQUEL) 100 MG tablet Take 1 tablet (100 mg total) by mouth at bedtime. 30 tablet 1  . sertraline (ZOLOFT) 100 MG tablet Take 1 tablet (100 mg total) by mouth daily. 30 tablet 1  . Vitamin D, Ergocalciferol, (DRISDOL) 1.25 MG (50000 UNIT) CAPS capsule     . fenofibrate (TRICOR) 145 MG tablet Take 1 tablet (145 mg total) by mouth daily. 90 tablet 1   No current facility-administered medications on file prior to visit.    No Known Allergies  Family History  Problem Relation Age of Onset  . Diabetes Mother   . Hypertension Mother    . Hypertension Father   . Diabetes Father   . Colon cancer Neg Hx   . Colon polyps Neg Hx   . Esophageal cancer Neg Hx   . Rectal cancer Neg Hx   . Stomach cancer Neg Hx     BP 120/78 (BP Location: Right Arm, Patient Position: Sitting, Cuff Size: Large)   Pulse 73   Ht $R'5\' 10"'xc$  (1.778 m)   Wt 224 lb 6.4 oz (101.8 kg)   SpO2 97%   BMI 32.20 kg/m    Review of Systems     Objective:   Physical Exam VITAL SIGNS:  See vs page GENERAL: no distress Pulses: dorsalis pedis intact bilat.   MSK: no deformity of the feet CV: no leg edema Skin:  no ulcer on the feet.  normal color and temp on the feet. Neuro: sensation is intact to touch on the feet       Assessment & Plan:  Type 2 DM, with stage 3 CRI Hypoglycemia, due to uncertain medication SDOH: This limits rx options.  I printed for pt the list of [facility] $4/month prescriptions.    Patient Instructions  check your blood sugar once a day.  vary the time of day when you check, between before the 3 meals, and at bedtime.  also check if you have symptoms of your blood sugar being too high or too low.  please keep a record of the readings and bring it to your next appointment here (or you can bring the meter itself).  You can write it on any piece of paper.  please call us sooner if your blood sugar goes below 70, or if you have a lot of readings over 200.   Please continue these 2 diabetes medications: metformin and glipizide. Please come back for a follow-up appointment in 3 months.                     .                 .          (    ).       .              70          200.         :  .      3 .

## 2020-11-19 NOTE — Patient Instructions (Addendum)
check your blood sugar once a day.  vary the time of day when you check, between before the 3 meals, and at bedtime.  also check if you have symptoms of your blood sugar being too high or too low.  please keep a record of the readings and bring it to your next appointment here (or you can bring the meter itself).  You can write it on any piece of paper.  please call us sooner if your blood sugar goes below 70, or if you have a lot of readings over 200.   Please continue these 2 diabetes medications: metformin and glipizide. Please come back for a follow-up appointment in 3 months.                     .                 .          (    ).       .              70          200.        :  .      3 .

## 2020-12-15 ENCOUNTER — Ambulatory Visit: Payer: Self-pay | Admitting: Endocrinology

## 2021-01-10 ENCOUNTER — Telehealth (HOSPITAL_COMMUNITY): Payer: Self-pay | Admitting: Psychiatry

## 2021-01-10 ENCOUNTER — Other Ambulatory Visit: Payer: Self-pay

## 2021-01-20 ENCOUNTER — Ambulatory Visit (HOSPITAL_COMMUNITY): Payer: Self-pay | Admitting: Psychiatry

## 2021-01-20 ENCOUNTER — Other Ambulatory Visit: Payer: Self-pay

## 2021-02-03 ENCOUNTER — Other Ambulatory Visit: Payer: Self-pay | Admitting: Nurse Practitioner

## 2021-02-03 DIAGNOSIS — E1142 Type 2 diabetes mellitus with diabetic polyneuropathy: Secondary | ICD-10-CM

## 2021-02-11 ENCOUNTER — Ambulatory Visit: Payer: Self-pay | Attending: Nurse Practitioner | Admitting: Nurse Practitioner

## 2021-02-11 ENCOUNTER — Other Ambulatory Visit: Payer: Self-pay

## 2021-02-11 ENCOUNTER — Encounter: Payer: Self-pay | Admitting: Nurse Practitioner

## 2021-02-11 VITALS — BP 113/69 | HR 71 | Ht 70.0 in | Wt 226.4 lb

## 2021-02-11 DIAGNOSIS — E785 Hyperlipidemia, unspecified: Secondary | ICD-10-CM

## 2021-02-11 DIAGNOSIS — E890 Postprocedural hypothyroidism: Secondary | ICD-10-CM

## 2021-02-11 DIAGNOSIS — Z1211 Encounter for screening for malignant neoplasm of colon: Secondary | ICD-10-CM

## 2021-02-11 DIAGNOSIS — E1142 Type 2 diabetes mellitus with diabetic polyneuropathy: Secondary | ICD-10-CM

## 2021-02-11 DIAGNOSIS — M25552 Pain in left hip: Secondary | ICD-10-CM

## 2021-02-11 DIAGNOSIS — F419 Anxiety disorder, unspecified: Secondary | ICD-10-CM

## 2021-02-11 DIAGNOSIS — Z114 Encounter for screening for human immunodeficiency virus [HIV]: Secondary | ICD-10-CM

## 2021-02-11 DIAGNOSIS — D649 Anemia, unspecified: Secondary | ICD-10-CM

## 2021-02-11 DIAGNOSIS — F32A Depression, unspecified: Secondary | ICD-10-CM

## 2021-02-11 LAB — POCT GLYCOSYLATED HEMOGLOBIN (HGB A1C): HbA1c, POC (controlled diabetic range): 8.2 % — AB (ref 0.0–7.0)

## 2021-02-11 LAB — GLUCOSE, POCT (MANUAL RESULT ENTRY): POC Glucose: 93 mg/dl (ref 70–99)

## 2021-02-11 MED ORDER — MELOXICAM 15 MG PO TABS
15.0000 mg | ORAL_TABLET | Freq: Every day | ORAL | 0 refills | Status: DC
  Filled 2021-02-11: qty 30, 30d supply, fill #0

## 2021-02-11 MED ORDER — LEVOTHYROXINE SODIUM 125 MCG PO TABS
125.0000 ug | ORAL_TABLET | Freq: Every day | ORAL | 1 refills | Status: DC
  Filled 2021-02-11: qty 30, 30d supply, fill #0
  Filled 2021-03-18: qty 30, 30d supply, fill #1
  Filled 2021-05-03: qty 30, 30d supply, fill #2

## 2021-02-11 MED ORDER — EMPAGLIFLOZIN 10 MG PO TABS
10.0000 mg | ORAL_TABLET | Freq: Every day | ORAL | 0 refills | Status: AC
  Filled 2021-02-11: qty 30, 30d supply, fill #0
  Filled 2021-03-18: qty 14, 14d supply, fill #1

## 2021-02-11 MED ORDER — ESCITALOPRAM OXALATE 10 MG PO TABS
10.0000 mg | ORAL_TABLET | Freq: Every day | ORAL | 0 refills | Status: DC
  Filled 2021-02-11: qty 30, 30d supply, fill #0

## 2021-02-11 MED ORDER — GLIPIZIDE 5 MG PO TABS
5.0000 mg | ORAL_TABLET | Freq: Every day | ORAL | 1 refills | Status: DC
  Filled 2021-02-11: qty 30, 30d supply, fill #0
  Filled 2021-03-18: qty 30, 30d supply, fill #1
  Filled 2021-05-03: qty 30, 30d supply, fill #2

## 2021-02-11 MED ORDER — ATORVASTATIN CALCIUM 40 MG PO TABS
40.0000 mg | ORAL_TABLET | Freq: Every day | ORAL | 3 refills | Status: DC
  Filled 2021-02-11: qty 30, 30d supply, fill #0
  Filled 2021-03-18: qty 30, 30d supply, fill #1
  Filled 2021-05-03: qty 30, 30d supply, fill #2
  Filled 2021-11-10: qty 30, 30d supply, fill #0

## 2021-02-11 NOTE — Progress Notes (Signed)
Assessment & Plan:  Joe Wallace was seen today for diabetes.  Diagnoses and all orders for this visit:  Type 2 diabetes mellitus with diabetic polyneuropathy, without long-term current use of insulin (HCC) -     POCT glucose (manual entry) -     POCT glycosylated hemoglobin (Hb A1C) -     empagliflozin (JARDIANCE) 10 MG TABS tablet; Take 1 tablet (10 mg total) by mouth daily before breakfast. NEEDS PASS -     CMP14+EGFR -     Microalbumin / creatinine urine ratio -     glipiZIDE (GLUCOTROL) 5 MG tablet; Take 1 tablet (5 mg total) by mouth daily before breakfast. Continue blood sugar control as discussed in office today, low carbohydrate diet, and regular physical exercise as tolerated, 150 minutes per week (30 min each day, 5 days per week, or 50 min 3 days per week). Keep blood sugar logs with fasting goal of 90-130 mg/dl, post prandial (after you eat) less than 180.  For Hypoglycemia: BS <60 and Hyperglycemia BS >400; contact the clinic ASAP. Annual eye exams and foot exams are recommended.   Dyslipidemia, goal LDL below 70 -     Lipid panel -     atorvastatin (LIPITOR) 40 MG tablet; Take 1 tablet (40 mg total) by mouth daily. INSTRUCTIONS: Work on a low fat, heart healthy diet and participate in regular aerobic exercise program by working out at least 150 minutes per week; 5 days a week-30 minutes per day. Avoid red meat/beef/steak,  fried foods. junk foods, sodas, sugary drinks, unhealthy snacking, alcohol and smoking.  Drink at least 80 oz of water per day and monitor your carbohydrate intake daily.   Colon cancer screening -     Fecal occult blood, imunochemical([facility])  Anemia, unspecified type -     CBC  Encounter for screening for HIV -     HIV antibody (with reflex)   Postoperative hypothyroidism -     levothyroxine (EUTHYROX) 125 MCG tablet; Take 1 tablet (125 mcg total) by mouth daily. -     Thyroid Panel With TSH  Anxiety and depression -      escitalopram (LEXAPRO) 10 MG tablet; Take 1 tablet (10 mg total) by mouth daily.  Left hip pain -     meloxicam (MOBIC) 15 MG tablet; Take 1 tablet (15 mg total) by mouth daily. Work on losing weight to help reduce joint pain. May alternate with heat and ice application for pain relief. May also alternate with acetaminophen as prescribed pain relief. Other alternatives include massage, acupuncture and water aerobics.  You must stay active and avoid a sedentary lifestyle.    Patient has been counseled on age-appropriate routine health concerns for screening and prevention. These are reviewed and up-to-date. Referrals have been placed accordingly. Immunizations are up-to-date or declined.    Subjective:   Chief Complaint  Patient presents with  . Diabetes   HPI Joe Wallace 60 y.o. male presents to office today for follow up.  He has a past medical history of Arthritis, Cancer ([place]) (2017), Depression, DM2, Headache, Hyperlipidemia, Memory loss, Thyroid neoplasm, and Visual disorder.  DM 2 Not well controlled. Taking metformin 1000 mg BID and glipizide 5 mg daily. Adding Jardiance 10 mg daily today. LDL not at goal however he is taking atorvastatin 40 mg daily and tricor 145 mg daily as prescribed. He is no longer insured as his citizenship was denied. He will no longer be able to see Endocrinology due to out of pocket  cost. Lab Results  Component Value Date   HGBA1C 8.2 (A) 02/11/2021   Lab Results  Component Value Date   LDLCALC 67 08/13/2020    HIP Pain Left sided. Onset 3 weeks ago. Denies any injury or trauma.    Hypothyroidism Thyroid levels well controlled with levothyroxine 125 mg daily.  Lab Results  Component Value Date   TSH 2.360 08/13/2020     Depression and Anxiety States he stopped taking sertraline due to shortness of breath.  Depression screen [facility] 2/9 02/11/2021 08/13/2020 11/06/2019 08/07/2018 06/11/2018  Decreased Interest _0 Down, Depressed,  Hopeless 0 0 0 0 0  PHQ - 2 Score _1 Altered sleeping _2 Tired, decreased energy _3 Change in appetite 2 0 0 0 0  Feeling bad or failure about yourself  0 0 0 0 2  Trouble concentrating 3 0 _4 Moving slowly or fidgety/restless 0 _5 Suicidal thoughts 0 0 0 0 0  PHQ-9 Score _6 Some recent data might be hidden   GAD 7 : Generalized Anxiety Score 02/11/2021 08/13/2020 11/06/2019 08/07/2018  Nervous, Anxious, on Edge 0 _7 Control/stop worrying 0 0 3 1  Worry too much - different things 0 _8 Trouble relaxing _9 Restless 0 _10 Easily annoyed or irritable 0 _11 Afraid - awful might happen 0 0 0 0  Total GAD 7 Score _12 Review of Systems  Constitutional: Negative for fever, malaise/fatigue and weight loss.  HENT: Negative.  Negative for nosebleeds.   Eyes: Negative.  Negative for blurred vision, double vision and photophobia.  Respiratory: Negative.  Negative for cough and shortness of breath.   Cardiovascular: Negative.  Negative for chest pain, palpitations and leg swelling.  Gastrointestinal: Negative.  Negative for heartburn, nausea and vomiting.  Musculoskeletal: Positive for joint pain. Negative for myalgias.  Neurological: Positive for sensory change. Negative for dizziness, focal weakness, seizures and headaches.  Psychiatric/Behavioral: Positive for depression and memory loss. Negative for suicidal ideas. The patient is nervous/anxious.     Past Medical History:  Diagnosis Date  . Arthritis   . 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 Depression   . Diabetes mellitus without complication ([place])   . Headache   . Hyperlipidemia   . Memory loss   . Thyroid neoplasm   . Visual disorder    LEFT EYE    Past Surgical History:  Procedure Laterality Date  . head trauma     L sided fragment from war.  Marland Kitchen SHOULDER ARTHROSCOPY    . THYROIDECTOMY N/A 08/13/2015   Procedure: TOTAL  THYROIDECTOMY;  Surgeon: Armandina Gemma, MD;  Location: [facility];  Service: General;  Laterality: N/A;    Family History  Problem Relation Age of Onset  . Diabetes Mother   . Hypertension Mother   . Hypertension Father   . Diabetes Father   . Colon cancer Neg Hx   . Colon polyps Neg Hx   . Esophageal cancer Neg Hx   . Rectal cancer Neg Hx   . Stomach cancer Neg Hx     Social History Reviewed with no changes to be made today.   Outpatient Medications Prior to Visit  Medication  Sig Dispense Refill  . Accu-Chek FastClix Lancets MISC Use to check blood sugar twice daily. E11.42 102 each 11  . acetaminophen (TYLENOL) 500 MG tablet Take 1 tablet (500 mg total) by mouth every 6 (six) hours as needed. 30 tablet 0  . aspirin 81 MG tablet Take 81 mg by mouth daily.    . Blood Glucose Monitoring Suppl (ACCU-CHEK GUIDE ME) w/Device KIT 1 kit by Does not apply route 2 (two) times daily. Use to check blood sugar twice daily. E11.42 1 kit 0  . fenofibrate (TRICOR) 145 MG tablet Take 1 tablet (145 mg total) by mouth daily. 90 tablet 1  . glucose blood (ACCU-CHEK GUIDE) test strip USE 1  TWICE DAILY 100 each 0  . Insulin Pen Needle (B-D UF III MINI PEN NEEDLES) 31G X 5 MM MISC Use as instructed. Inject into the skin once nightly. 100 each 6  . metFORMIN (GLUCOPHAGE) 1000 MG tablet Take 1 tablet (1,000 mg total) by mouth 2 (two) times daily with a meal. 180 tablet 3  . omega-3 acid ethyl esters (LOVAZA) 1 g capsule Take 1 capsule (1 g total) by mouth 2 (two) times daily. 180 capsule 1  . QUEtiapine (SEROQUEL) 100 MG tablet Take 1 tablet (100 mg total) by mouth at bedtime. 30 tablet 1  . Vitamin D, Ergocalciferol, (DRISDOL) 1.25 MG (50000 UNIT) CAPS capsule     . atorvastatin (LIPITOR) 40 MG tablet Take 1 tablet (40 mg total) by mouth daily. 90 tablet 3  . celecoxib (CELEBREX) 200 MG capsule Take 1 capsule (200 mg total) by mouth 2 (two) times daily as needed. 60 capsule 6  . glipiZIDE (GLUCOTROL) 5 MG  tablet Take 1 tablet (5 mg total) by mouth daily before breakfast. 90 tablet 3  . levothyroxine (EUTHYROX) 125 MCG tablet Take 1 tablet (125 mcg total) by mouth daily. 90 tablet 1  . sertraline (ZOLOFT) 100 MG tablet Take 1 tablet (100 mg total) by mouth daily. 30 tablet 1   No facility-administered medications prior to visit.    No Known Allergies     Objective:    BP 113/69   Pulse 71   Ht 5' 10" (1.778 m)   Wt 226 lb 6.4 oz (102.7 kg)   SpO2 97%   BMI 32.49 kg/m  Wt Readings from Last 3 Encounters:  02/11/21 226 lb 6.4 oz (102.7 kg)  11/19/20 224 lb 6.4 oz (101.8 kg)  10/19/20 224 lb (101.6 kg)    Physical Exam Vitals and nursing note reviewed.  Constitutional:      Appearance: He is well-developed.  HENT:     Head: Normocephalic and atraumatic.  Cardiovascular:     Rate and Rhythm: Normal rate and regular rhythm.     Heart sounds: Normal heart sounds. No murmur heard. No friction rub. No gallop.   Pulmonary:     Effort: Pulmonary effort is normal. No tachypnea or respiratory distress.     Breath sounds: Normal breath sounds. No decreased breath sounds, wheezing, rhonchi or rales.  Chest:     Chest wall: No tenderness.  Abdominal:     General: Bowel sounds are normal.     Palpations: Abdomen is soft.  Musculoskeletal:        General: Normal range of motion.     Cervical back: Normal range of motion.  Skin:    General: Skin is warm and dry.  Neurological:     Mental Status: He is alert and oriented to person, place,  and time.     Coordination: Coordination normal.  Psychiatric:        Mood and Affect: Affect is flat.        Speech: Speech normal.        Behavior: Behavior normal. Behavior is cooperative.        Thought Content: Thought content normal.        Cognition and Memory: Memory is impaired.        Judgment: Judgment normal.          Patient has been counseled extensively about nutrition and exercise as well as the importance of adherence with  medications and regular follow-up. The patient was given clear instructions to go to ER or return to medical center if symptoms don't improve, worsen or new problems develop. The patient verbalized understanding.   Follow-up: Return in about 3 months (around 05/14/2021).   Gildardo Pounds, FNP-BC [facility] and [facility], [place]   02/11/2021, 9:02 PM

## 2021-02-12 LAB — CMP14+EGFR
ALT: 16 IU/L (ref 0–44)
AST: 14 IU/L (ref 0–40)
Albumin/Globulin Ratio: 1.6 (ref 1.2–2.2)
Albumin: 4.6 g/dL (ref 3.8–4.8)
Alkaline Phosphatase: 56 IU/L (ref 44–121)
BUN/Creatinine Ratio: 16 (ref 10–24)
BUN: 20 mg/dL (ref 8–27)
Bilirubin Total: 0.2 mg/dL (ref 0.0–1.2)
CO2: 24 mmol/L (ref 20–29)
Calcium: 9.7 mg/dL (ref 8.6–10.2)
Chloride: 100 mmol/L (ref 96–106)
Creatinine, Ser: 1.23 mg/dL (ref 0.76–1.27)
Globulin, Total: 2.9 g/dL (ref 1.5–4.5)
Glucose: 84 mg/dL (ref 65–99)
Potassium: 4.5 mmol/L (ref 3.5–5.2)
Sodium: 138 mmol/L (ref 134–144)
Total Protein: 7.5 g/dL (ref 6.0–8.5)
eGFR: 66 mL/min/{1.73_m2} (ref >59)

## 2021-02-12 LAB — CBC
Hematocrit: 41 % (ref 37.5–51.0)
Hemoglobin: 12.8 g/dL — ABNORMAL LOW (ref 13.0–17.7)
MCH: 25.8 pg — ABNORMAL LOW (ref 26.6–33.0)
MCHC: 31.2 g/dL — ABNORMAL LOW (ref 31.5–35.7)
MCV: 83 fL (ref 79–97)
Platelets: 251 10*3/uL (ref 150–450)
RBC: 4.97 x10E6/uL (ref 4.14–5.80)
RDW: 13.1 % (ref 11.6–15.4)
WBC: 8.2 10*3/uL (ref 3.4–10.8)

## 2021-02-12 LAB — LIPID PANEL
Chol/HDL Ratio: 3.4 ratio (ref 0.0–5.0)
Cholesterol, Total: 140 mg/dL (ref 100–199)
HDL: 41 mg/dL (ref >39)
LDL Chol Calc (NIH): 70 mg/dL (ref 0–99)
Triglycerides: 174 mg/dL — ABNORMAL HIGH (ref 0–149)
VLDL Cholesterol Cal: 29 mg/dL (ref 5–40)

## 2021-02-12 LAB — MICROALBUMIN / CREATININE URINE RATIO
Creatinine, Urine: 147.5 mg/dL
Microalb/Creat Ratio: 66 mg/g creat — ABNORMAL HIGH (ref 0–29)
Microalbumin, Urine: 96.9 ug/mL

## 2021-02-12 LAB — THYROID PANEL WITH TSH
Free Thyroxine Index: 2.2 (ref 1.2–4.9)
T3 Uptake Ratio: 24 % (ref 24–39)
T4, Total: 9.1 ug/dL (ref 4.5–12.0)
TSH: 7.83 u[IU]/mL — ABNORMAL HIGH (ref 0.450–4.500)

## 2021-02-12 LAB — HIV ANTIBODY (ROUTINE TESTING W REFLEX): HIV Screen 4th Generation wRfx: NONREACTIVE

## 2021-02-25 ENCOUNTER — Ambulatory Visit: Payer: Self-pay | Admitting: Endocrinology

## 2021-03-18 ENCOUNTER — Other Ambulatory Visit: Payer: Self-pay

## 2021-03-18 ENCOUNTER — Other Ambulatory Visit: Payer: Self-pay | Admitting: Nurse Practitioner

## 2021-03-18 DIAGNOSIS — M25552 Pain in left hip: Secondary | ICD-10-CM

## 2021-03-18 MED ORDER — MELOXICAM 15 MG PO TABS
15.0000 mg | ORAL_TABLET | Freq: Every day | ORAL | 0 refills | Status: DC
  Filled 2021-03-18: qty 30, 30d supply, fill #0

## 2021-04-19 ENCOUNTER — Other Ambulatory Visit: Payer: Self-pay

## 2021-04-21 ENCOUNTER — Other Ambulatory Visit: Payer: Self-pay

## 2021-04-22 ENCOUNTER — Other Ambulatory Visit: Payer: Self-pay

## 2021-05-03 ENCOUNTER — Other Ambulatory Visit: Payer: Self-pay

## 2021-05-03 ENCOUNTER — Other Ambulatory Visit: Payer: Self-pay | Admitting: Nurse Practitioner

## 2021-05-03 DIAGNOSIS — M25552 Pain in left hip: Secondary | ICD-10-CM

## 2021-05-03 MED ORDER — MELOXICAM 15 MG PO TABS
15.0000 mg | ORAL_TABLET | Freq: Every day | ORAL | 0 refills | Status: DC
  Filled 2021-05-03: qty 30, 30d supply, fill #0

## 2021-05-04 ENCOUNTER — Other Ambulatory Visit: Payer: Self-pay

## 2021-05-05 ENCOUNTER — Other Ambulatory Visit: Payer: Self-pay

## 2021-05-16 ENCOUNTER — Other Ambulatory Visit: Payer: Self-pay

## 2021-05-16 ENCOUNTER — Ambulatory Visit: Payer: Self-pay | Attending: Nurse Practitioner | Admitting: Nurse Practitioner

## 2021-05-16 ENCOUNTER — Encounter: Payer: Self-pay | Admitting: Nurse Practitioner

## 2021-05-16 DIAGNOSIS — E1142 Type 2 diabetes mellitus with diabetic polyneuropathy: Secondary | ICD-10-CM

## 2021-05-16 DIAGNOSIS — E785 Hyperlipidemia, unspecified: Secondary | ICD-10-CM

## 2021-05-16 DIAGNOSIS — E782 Mixed hyperlipidemia: Secondary | ICD-10-CM

## 2021-05-16 DIAGNOSIS — F32A Depression, unspecified: Secondary | ICD-10-CM

## 2021-05-16 DIAGNOSIS — F419 Anxiety disorder, unspecified: Secondary | ICD-10-CM

## 2021-05-16 MED ORDER — FENOFIBRATE 145 MG PO TABS
145.0000 mg | ORAL_TABLET | Freq: Every day | ORAL | 1 refills | Status: DC
Start: 2021-05-16 — End: 2022-02-11
  Filled 2021-10-10: qty 30, 30d supply, fill #0

## 2021-05-16 NOTE — Progress Notes (Signed)
Virtual Visit via Telephone Note Due to national recommendations of social distancing due to [place] 19, telehealth visit is felt to be most appropriate for this patient at this time.  I discussed the limitations, risks, security and privacy concerns of performing an evaluation and management service by telephone and the availability of in person appointments. I also discussed with the patient that there may be a patient responsible charge related to this service. The patient expressed understanding and agreed to proceed.    I connected with Joe Wallace on 05/16/21  at   3:50 PM EDT  EDT by telephone and verified that I am speaking with the correct person using two identifiers.  Location of Patient: Private Residence   Location of Provider: [place] [facility] [employer] Office    Persons participating in Telemedicine visit: Joe Rankins FNP-BC Joe Wallace    History of Present Illness: Telemedicine visit for: DM He has a past medical history of Arthritis, Cancer ([place]) (2017), Depression, Diabetes mellitus without complication ([place]), Headache, Hyperlipidemia, Memory loss, Thyroid neoplasm, and Visual disorder.   Seeing DR. Afreen [facility]) and Dr. Leta Baptist (Neuro) for memory loss likely related to depression, PTSD, stress. Currently taking seroquel and sertraline 100 mg daily for depression and also endorses insomnia.    DM Not at goal. Taking glipizide 5 mg daily and metformin 1000 mg BID as prescribed. (states Jardiance was $600 and he could not afford).  Lab Results  Component Value Date   HGBA1C 8.2 (A) 02/11/2021   Lab Results  Component Value Date   LDLCALC 70 02/11/2021  LDL at goal with atorvastatin 40 mg daily, lovaza 1 gm BID, and tricor 145 mg daily.     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   SHOULDER ARTHROSCOPY     THYROIDECTOMY N/A 08/13/2015   Procedure: TOTAL THYROIDECTOMY;  Surgeon: Armandina Gemma, MD;  Location: [facility];  Service: General;  Laterality: N/A;    Family History  Problem Relation Age of Onset   Diabetes Mother    Hypertension Mother    Hypertension Father    Diabetes Father    Colon cancer Neg Hx    Colon polyps Neg Hx    Esophageal cancer Neg Hx    Rectal cancer Neg Hx    Stomach cancer Neg Hx     Social History   Socioeconomic History   Marital status: Married    Spouse name: Not on file   Number of children: Not on file   Years of education: Not on file   Highest education level: Not on file  Occupational History   Not on file  Tobacco Use   Smoking status: Never   Smokeless tobacco: Never  Vaping Use   Vaping Use: Never used  Substance and Sexual Activity   Alcohol use: No   Drug use: No   Sexual activity: Yes  Other Topics Concern   Not on file  Social History Narrative   Lives home with wife and 7 boyss, [place].  Does  not work, [place].  Education: middle school.  Soda once daily.    Social Determinants of Health   Financial Resource Strain: Not on file  Food Insecurity: Not on file  Transportation Needs: Not on file  Physical Activity: Not on file  Stress: Not on file  Social Connections: Not on file     Observations/Objective: Awake, alert and oriented x 3   Review of Systems  Constitutional:  Negative for fever, malaise/fatigue and weight loss.  HENT: Negative.  Negative for nosebleeds.   Eyes: Negative.  Negative for blurred vision, double vision and photophobia.  Respiratory: Negative.  Negative for cough and shortness of breath.   Cardiovascular: Negative.  Negative for chest pain, palpitations and leg swelling.   Gastrointestinal: Negative.  Negative for heartburn, nausea and vomiting.  Musculoskeletal: Negative.  Negative for myalgias.  Neurological: Negative.  Negative for dizziness, focal weakness, seizures and headaches.  Psychiatric/Behavioral:  Positive for depression. Negative for suicidal ideas. The patient is nervous/anxious.    Assessment and Plan: Diagnoses and all orders for this visit:  Type 2 diabetes mellitus with diabetic polyneuropathy, without long-term current use of insulin ([place]) Continue blood sugar control as discussed in office today, low carbohydrate diet, and regular physical exercise as tolerated, 150 minutes per week (30 min each day, 5 days per week, or 50 min 3 days per week). Keep blood sugar logs with fasting goal of 90-130 mg/dl, post prandial (after you eat) less than 180.  For Hypoglycemia: BS <60 and Hyperglycemia BS >400; contact the clinic ASAP. Annual eye exams and foot exams are recommended.   Anxiety and depression Continue all medications as prescribed  Mixed hypertriglyceridemia -     fenofibrate (TRICOR) 145 MG tablet; Take 1 tablet (145 mg total) by mouth daily.  Dyslipidemia, goal LDL below 70 -     fenofibrate (TRICOR) 145 MG tablet; Take 1 tablet (145 mg total) by mouth daily. INSTRUCTIONS: Work on a low fat, heart healthy diet and participate in regular aerobic exercise program by working out at least 150 minutes per week; 5 days a week-30 minutes per day. Avoid red meat/beef/steak,  fried foods. junk foods, sodas, sugary drinks, unhealthy snacking, alcohol and smoking.  Drink at least 80 oz of water per day and monitor your carbohydrate intake daily.      Follow Up Instructions Return in about 2 months (around 07/16/2021).     I discussed the assessment and treatment plan with the patient. The patient was provided an opportunity to ask questions and all were answered. The patient agreed with the plan and demonstrated an understanding of the  instructions.   The patient was advised to call back or seek an in-person evaluation if the symptoms worsen or if the condition fails to improve as anticipated.  I provided 12 minutes of non-face-to-face time during this encounter including median intraservice time, reviewing previous notes, labs, imaging, medications and explaining diagnosis and management.  Gildardo Pounds, FNP-BC

## 2021-05-25 ENCOUNTER — Other Ambulatory Visit: Payer: Self-pay

## 2021-05-25 ENCOUNTER — Ambulatory Visit: Payer: Self-pay | Attending: Nurse Practitioner

## 2021-05-25 ENCOUNTER — Other Ambulatory Visit: Payer: Self-pay | Admitting: Nurse Practitioner

## 2021-05-25 DIAGNOSIS — E1142 Type 2 diabetes mellitus with diabetic polyneuropathy: Secondary | ICD-10-CM

## 2021-05-25 DIAGNOSIS — E785 Hyperlipidemia, unspecified: Secondary | ICD-10-CM

## 2021-05-26 LAB — CMP14+EGFR
ALT: 16 IU/L (ref 0–44)
AST: 15 IU/L (ref 0–40)
Albumin/Globulin Ratio: 1.6 (ref 1.2–2.2)
Albumin: 4.5 g/dL (ref 3.8–4.8)
Alkaline Phosphatase: 57 IU/L (ref 44–121)
BUN/Creatinine Ratio: 13 (ref 10–24)
BUN: 17 mg/dL (ref 8–27)
Bilirubin Total: 0.3 mg/dL (ref 0.0–1.2)
CO2: 25 mmol/L (ref 20–29)
Calcium: 9.6 mg/dL (ref 8.6–10.2)
Chloride: 102 mmol/L (ref 96–106)
Creatinine, Ser: 1.35 mg/dL — ABNORMAL HIGH (ref 0.76–1.27)
Globulin, Total: 2.8 g/dL (ref 1.5–4.5)
Glucose: 126 mg/dL — ABNORMAL HIGH (ref 65–99)
Potassium: 4.6 mmol/L (ref 3.5–5.2)
Sodium: 141 mmol/L (ref 134–144)
Total Protein: 7.3 g/dL (ref 6.0–8.5)
eGFR: 59 mL/min/{1.73_m2} — ABNORMAL LOW (ref >59)

## 2021-05-26 LAB — HEMOGLOBIN A1C
Est. average glucose Bld gHb Est-mCnc: 194 mg/dL
Hgb A1c MFr Bld: 8.4 % — ABNORMAL HIGH (ref 4.8–5.6)

## 2021-05-26 LAB — LIPID PANEL
Chol/HDL Ratio: 4 ratio (ref 0.0–5.0)
Cholesterol, Total: 162 mg/dL (ref 100–199)
HDL: 41 mg/dL (ref >39)
LDL Chol Calc (NIH): 89 mg/dL (ref 0–99)
Triglycerides: 184 mg/dL — ABNORMAL HIGH (ref 0–149)
VLDL Cholesterol Cal: 32 mg/dL (ref 5–40)

## 2021-05-27 ENCOUNTER — Other Ambulatory Visit: Payer: Self-pay | Admitting: Nurse Practitioner

## 2021-05-27 ENCOUNTER — Other Ambulatory Visit: Payer: Self-pay

## 2021-05-27 DIAGNOSIS — F32A Depression, unspecified: Secondary | ICD-10-CM

## 2021-05-27 DIAGNOSIS — F419 Anxiety disorder, unspecified: Secondary | ICD-10-CM

## 2021-05-27 DIAGNOSIS — E1142 Type 2 diabetes mellitus with diabetic polyneuropathy: Secondary | ICD-10-CM

## 2021-05-27 MED ORDER — GLIPIZIDE 10 MG PO TABS: 10.0000 mg | ORAL_TABLET | Freq: Every day | ORAL | 0 refills | Status: DC

## 2021-05-27 MED ORDER — GLIPIZIDE 10 MG PO TABS
10.0000 mg | ORAL_TABLET | Freq: Every day | ORAL | 0 refills | Status: DC
  Filled 2021-05-27 – 2021-11-10 (×2): qty 30, 30d supply, fill #0

## 2021-05-27 MED ORDER — ESCITALOPRAM OXALATE 10 MG PO TABS
10.0000 mg | ORAL_TABLET | Freq: Every day | ORAL | 0 refills | Status: DC
  Filled 2021-05-27: qty 30, 30d supply, fill #0

## 2021-05-31 ENCOUNTER — Other Ambulatory Visit: Payer: Self-pay

## 2021-06-07 ENCOUNTER — Other Ambulatory Visit: Payer: Self-pay

## 2021-06-10 ENCOUNTER — Other Ambulatory Visit: Payer: Self-pay

## 2021-06-15 ENCOUNTER — Other Ambulatory Visit: Payer: Self-pay | Admitting: Nurse Practitioner

## 2021-06-15 NOTE — Telephone Encounter (Signed)
Requested medications are due for refill today.  yes  Requested medications are on the active medications list.  yes  Last refill. 08/17/2020  Future visit scheduled.   yes  Notes to clinic.  Medication not delegated. Listed as historical medication.

## 2021-07-11 ENCOUNTER — Ambulatory Visit: Payer: Self-pay | Admitting: Nurse Practitioner

## 2021-08-10 ENCOUNTER — Other Ambulatory Visit: Payer: Self-pay

## 2021-10-02 ENCOUNTER — Other Ambulatory Visit: Payer: Self-pay | Admitting: Nurse Practitioner

## 2021-10-02 DIAGNOSIS — E890 Postprocedural hypothyroidism: Secondary | ICD-10-CM

## 2021-10-02 NOTE — Telephone Encounter (Signed)
Requested medication (s) are due for refill today: yes  Requested medication (s) are on the active medication list: yes  Last refill:  02/11/21 #90 1 RF  Future visit scheduled: no  Notes to clinic:  no repeat TSH was due 05/14/21   Requested Prescriptions  Pending Prescriptions Disp Refills   levothyroxine (SYNTHROID) 125 MCG tablet [Pharmacy Med Name: Levothyroxine Sodium 125 MCG Oral Tablet] 90 tablet 0    Sig: Take 1 tablet by mouth once daily     Endocrinology:  Hypothyroid Agents Failed - 10/02/2021  1:09 PM      Failed - TSH needs to be rechecked within 3 months after an abnormal result. Refill until TSH is due.      Failed - TSH in normal range and within 360 days    TSH  Date Value Ref Range Status  02/11/2021 7.830 (H) 0.450 - 4.500 uIU/mL Final          Passed - Valid encounter within last 12 months    Recent Outpatient Visits           4 months ago Type 2 diabetes mellitus with diabetic polyneuropathy, without long-term current use of insulin [facility])   [place], [place] W, NP   7 months ago Type 2 diabetes mellitus with diabetic polyneuropathy, without long-term current use of insulin [facility])   [place], Vernia Buff, NP   10 months ago Postoperative hypothyroidism   [place], Vernia Buff, NP   1 year ago Postoperative hypothyroidism   [place], Vernia Buff, NP   1 year ago Postoperative hypothyroidism   [place], Vernia Buff, NP

## 2021-10-04 ENCOUNTER — Other Ambulatory Visit: Payer: Self-pay

## 2021-10-10 ENCOUNTER — Other Ambulatory Visit: Payer: Self-pay | Admitting: Nurse Practitioner

## 2021-10-10 ENCOUNTER — Other Ambulatory Visit: Payer: Self-pay

## 2021-10-10 NOTE — Telephone Encounter (Signed)
Requested medication (s) are due for refill today: yes  Requested medication (s) are on the active medication list: yes    Last refill: 06/19/21 #12  0 refills  Future visit scheduled no  Notes to clinic:   Not delegated. Labs due  Requested Prescriptions  Pending Prescriptions Disp Refills   Vitamin D, Ergocalciferol, (DRISDOL) 1.25 MG (50000 UNIT) CAPS capsule [Pharmacy Med Name: Vitamin D (Ergocalciferol) 1.25 MG (50000 UT) Oral Capsule] 12 capsule 0    Sig: Take 1 capsule by mouth once a week     Endocrinology:  Vitamins - Vitamin D Supplementation Failed - 10/10/2021  2:35 PM      Failed - 50,000 IU strengths are not delegated      Failed - Phosphate in normal range and within 360 days    No results found for: PHOS        Failed - Vitamin D in normal range and within 360 days    Vit D, 25-Hydroxy  Date Value Ref Range Status  01/01/2019 15.0 (L) 30.0 - 100.0 ng/mL Final    Comment:    Vitamin D deficiency has been defined by the Institute of Medicine and an Endocrine Society practice guideline as a level of serum 25-OH vitamin D less than 20 ng/mL (1,2). The Endocrine Society went on to further define vitamin D insufficiency as a level between 21 and 29 ng/mL (2). 1. IOM (Institute of Medicine). 2010. Dietary reference    intakes for calcium and D. [place]: The    [employer]. 2. Holick MF, Binkley [place], Bischoff-Ferrari HA, et al.    Evaluation, treatment, and prevention of vitamin D    deficiency: an Endocrine Society clinical practice    guideline. JCEM. 2011 Jul; 96(7):1911-30.           Passed - Ca in normal range and within 360 days    Calcium  Date Value Ref Range Status  05/25/2021 9.6 8.6 - 10.2 mg/dL Final          Passed - Valid encounter within last 12 months    Recent Outpatient Visits           4 months ago Type 2 diabetes mellitus with diabetic polyneuropathy, without long-term current use of insulin [facility])   [place], [place] W, NP   8 months ago Type 2 diabetes mellitus with diabetic polyneuropathy, without long-term current use of insulin [facility])   [place], Vernia Buff, NP   10 months ago Postoperative hypothyroidism   [place], Vernia Buff, NP   1 year ago Postoperative hypothyroidism   [place], Vernia Buff, NP   1 year ago Postoperative hypothyroidism   [place], Vernia Buff, NP

## 2021-10-23 ENCOUNTER — Other Ambulatory Visit: Payer: Self-pay | Admitting: Nurse Practitioner

## 2021-10-24 NOTE — Telephone Encounter (Signed)
Requested medication (s) are due for refill today: yes  Requested medication (s) are on the active medication list: yes    Last refill: 06/19/21  #12  0 refills  Future visit scheduled no  Notes to clinic:Not delegated  Requested Prescriptions  Pending Prescriptions Disp Refills   Vitamin D, Ergocalciferol, (DRISDOL) 1.25 MG (50000 UNIT) CAPS capsule [Pharmacy Med Name: Vitamin D (Ergocalciferol) 1.25 MG (50000 UT) Oral Capsule] 12 capsule 0    Sig: Take 1 capsule by mouth once a week     Endocrinology:  Vitamins - Vitamin D Supplementation Failed - 10/23/2021  3:17 PM      Failed - 50,000 IU strengths are not delegated      Failed - Phosphate in normal range and within 360 days    No results found for: PHOS        Failed - Vitamin D in normal range and within 360 days    Vit D, 25-Hydroxy  Date Value Ref Range Status  01/01/2019 15.0 (L) 30.0 - 100.0 ng/mL Final    Comment:    Vitamin D deficiency has been defined by the Institute of Medicine and an Endocrine Society practice guideline as a level of serum 25-OH vitamin D less than 20 ng/mL (1,2). The Endocrine Society went on to further define vitamin D insufficiency as a level between 21 and 29 ng/mL (2). 1. IOM (Institute of Medicine). 2010. Dietary reference    intakes for calcium and D. [place]: The    [employer]. 2. Holick MF, Binkley [place], Bischoff-Ferrari HA, et al.    Evaluation, treatment, and prevention of vitamin D    deficiency: an Endocrine Society clinical practice    guideline. JCEM. 2011 Jul; 96(7):1911-30.           Passed - Ca in normal range and within 360 days    Calcium  Date Value Ref Range Status  05/25/2021 9.6 8.6 - 10.2 mg/dL Final          Passed - Valid encounter within last 12 months    Recent Outpatient Visits           5 months ago Type 2 diabetes mellitus with diabetic polyneuropathy, without long-term current use of insulin [facility])   [place], [place] W, NP   8 months ago Type 2 diabetes mellitus with diabetic polyneuropathy, without long-term current use of insulin [facility])   [place], Vernia Buff, NP   11 months ago Postoperative hypothyroidism   [place], Vernia Buff, NP   1 year ago Postoperative hypothyroidism   [place], Vernia Buff, NP   1 year ago Postoperative hypothyroidism   [place], Vernia Buff, NP

## 2021-11-10 ENCOUNTER — Other Ambulatory Visit: Payer: Self-pay | Admitting: Nurse Practitioner

## 2021-11-10 ENCOUNTER — Other Ambulatory Visit: Payer: Self-pay

## 2021-11-10 DIAGNOSIS — E890 Postprocedural hypothyroidism: Secondary | ICD-10-CM

## 2021-11-10 DIAGNOSIS — M25552 Pain in left hip: Secondary | ICD-10-CM

## 2021-11-10 DIAGNOSIS — E1142 Type 2 diabetes mellitus with diabetic polyneuropathy: Secondary | ICD-10-CM

## 2021-11-11 ENCOUNTER — Other Ambulatory Visit: Payer: Self-pay | Admitting: Nurse Practitioner

## 2021-11-11 ENCOUNTER — Other Ambulatory Visit: Payer: Self-pay

## 2021-11-11 ENCOUNTER — Other Ambulatory Visit: Payer: Self-pay | Admitting: Pharmacist

## 2021-11-11 DIAGNOSIS — E890 Postprocedural hypothyroidism: Secondary | ICD-10-CM

## 2021-11-11 MED ORDER — MELOXICAM 15 MG PO TABS
15.0000 mg | ORAL_TABLET | Freq: Every day | ORAL | 0 refills | Status: DC
  Filled 2021-11-11: qty 30, 30d supply, fill #0

## 2021-11-11 MED ORDER — LEVOTHYROXINE SODIUM 125 MCG PO TABS
125.0000 ug | ORAL_TABLET | Freq: Every day | ORAL | 0 refills | Status: DC
  Filled 2021-11-11: qty 30, 30d supply, fill #0

## 2021-11-11 MED ORDER — TRUE METRIX METER W/DEVICE KIT
PACK | 0 refills | Status: DC
  Filled 2021-11-11: qty 1, 365d supply, fill #0

## 2021-11-11 MED ORDER — ACCU-CHEK GUIDE VI STRP
ORAL_STRIP | 0 refills | Status: DC
  Filled 2021-11-11: qty 100, 50d supply, fill #0

## 2021-11-11 MED ORDER — TRUEPLUS LANCETS 28G MISC
3 refills | Status: DC
  Filled 2021-11-11: qty 100, 33d supply, fill #0

## 2021-11-11 MED ORDER — TRUE METRIX BLOOD GLUCOSE TEST VI STRP
ORAL_STRIP | 12 refills | Status: DC
  Filled 2021-11-11: qty 100, 33d supply, fill #0
  Filled 2022-02-03: qty 100, 33d supply, fill #1

## 2021-11-11 NOTE — Telephone Encounter (Signed)
Requested medication (s) are due for refill today: yes  Requested medication (s) are on the active medication list: yes  Last refill:  Mobic 05/03/21 #30 with 0 RF, Synthroid 10/04/21 #30 with 0 RF  Future visit scheduled: no, NO SHOW 07/11/21, no upcoming appt scheduled.  Notes to clinic:  Has already had a curtesy refill and there is no upcoming appointment scheduled.       Requested Prescriptions  Pending Prescriptions Disp Refills   levothyroxine (EUTHYROX) 125 MCG tablet 90 tablet 1    Sig: Take 1 tablet (125 mcg total) by mouth daily.     Endocrinology:  Hypothyroid Agents Failed - 11/10/2021 12:54 PM      Failed - TSH in normal range and within 360 days    TSH  Date Value Ref Range Status  02/11/2021 7.830 (H) 0.450 - 4.500 uIU/mL Final          Passed - Valid encounter within last 12 months    Recent Outpatient Visits           5 months ago Type 2 diabetes mellitus with diabetic polyneuropathy, without long-term current use of insulin [facility])   [place], [place] W, NP   9 months ago Type 2 diabetes mellitus with diabetic polyneuropathy, without long-term current use of insulin [facility])   [place], Vernia Buff, NP   12 months ago Postoperative hypothyroidism   [place], Vernia Buff, NP   1 year ago Postoperative hypothyroidism   [place], [place] W, NP   1 year ago Postoperative hypothyroidism   [place], [place] W, NP               meloxicam (MOBIC) 15 MG tablet 30 tablet 0    Sig: Take 1 tablet (15 mg total) by mouth daily.     Analgesics:  COX2 Inhibitors Failed - 11/10/2021 12:54 PM      Failed - Manual Review: Labs are only required if the patient has taken medication for more than 8 weeks.      Failed - HGB in normal range and within 360 days    Hemoglobin  Date Value  Ref Range Status  02/11/2021 12.8 (L) 13.0 - 17.7 g/dL Final          Failed - Cr in normal range and within 360 days    Creat  Date Value Ref Range Status  06/29/2016 1.24 0.70 - 1.33 mg/dL Final    Comment:      For patients > or = 63 years of age: The upper reference limit for Creatinine is approximately 13% higher for people identified as African-American.      Creatinine, Ser  Date Value Ref Range Status  05/25/2021 1.35 (H) 0.76 - 1.27 mg/dL Final   Creatinine, Urine  Date Value Ref Range Status  06/29/2016 83 20 - 370 mg/dL Final          Passed - HCT in normal range and within 360 days    Hematocrit  Date Value Ref Range Status  02/11/2021 41.0 37.5 - 51.0 % Final          Passed - AST in normal range and within 360 days    AST  Date Value Ref Range Status  05/25/2021 15 0 - 40 IU/L Final          Passed -  ALT in normal range and within 360 days    ALT  Date Value Ref Range Status  05/25/2021 16 0 - 44 IU/L Final          Passed - eGFR is 30 or above and within 360 days    GFR, Est African American  Date Value Ref Range Status  06/29/2016 74 >=60 mL/min Final   GFR calc Af Amer  Date Value Ref Range Status  06/10/2020 77 >59 mL/min/1.73 Final    Comment:    **[facility] currently reports eGFR in compliance with the current**   recommendations of the [employer]. [facility] will   update reporting as new guidelines are published from the NKF-ASN   Task force.    GFR, Est Non African American  Date Value Ref Range Status  06/29/2016 64 >=60 mL/min Final   GFR calc non Af Amer  Date Value Ref Range Status  06/10/2020 67 >59 mL/min/1.73 Final   eGFR  Date Value Ref Range Status  05/25/2021 59 (L) >59 mL/min/1.73 Final          Passed - Patient is not pregnant      Passed - Valid encounter within last 12 months    Recent Outpatient Visits           5 months ago Type 2 diabetes mellitus with diabetic polyneuropathy, without  long-term current use of insulin ([place])   [place], [place] W, NP   9 months ago Type 2 diabetes mellitus with diabetic polyneuropathy, without long-term current use of insulin [facility])   [place], Vernia Buff, NP   12 months ago Postoperative hypothyroidism   [place], Vernia Buff, NP   1 year ago Postoperative hypothyroidism   [place], Vernia Buff, NP   1 year ago Postoperative hypothyroidism   [place], Vernia Buff, NP              Signed Prescriptions Disp Refills   glucose blood (ACCU-CHEK GUIDE) test strip 100 each 0    Sig: USE 1  TWICE DAILY     Endocrinology: Diabetes - Testing Supplies Passed - 11/10/2021 12:54 PM      Passed - Valid encounter within last 12 months    Recent Outpatient Visits           5 months ago Type 2 diabetes mellitus with diabetic polyneuropathy, without long-term current use of insulin ([place])   [place], [place] W, NP   9 months ago Type 2 diabetes mellitus with diabetic polyneuropathy, without long-term current use of insulin [facility])   [place], Vernia Buff, NP   12 months ago Postoperative hypothyroidism   [place], Vernia Buff, NP   1 year ago Postoperative hypothyroidism   [place], Vernia Buff, NP   1 year ago Postoperative hypothyroidism   [place], Vernia Buff, NP

## 2021-11-11 NOTE — Telephone Encounter (Signed)
Requested Prescriptions  Pending Prescriptions Disp Refills   glucose blood (ACCU-CHEK GUIDE) test strip 100 each 0    Sig: USE 1  TWICE DAILY     Endocrinology: Diabetes - Testing Supplies Passed - 11/10/2021 12:54 PM      Passed - Valid encounter within last 12 months    Recent Outpatient Visits          5 months ago Type 2 diabetes mellitus with diabetic polyneuropathy, without long-term current use of insulin ([place])   [place], [place] W, NP   9 months ago Type 2 diabetes mellitus with diabetic polyneuropathy, without long-term current use of insulin [facility])   [place], Vernia Buff, NP   12 months ago Postoperative hypothyroidism   [place], Vernia Buff, NP   1 year ago Postoperative hypothyroidism   [place], [place] W, NP   1 year ago Postoperative hypothyroidism   [place], [place] W, NP              levothyroxine (EUTHYROX) 125 MCG tablet 90 tablet 1    Sig: Take 1 tablet (125 mcg total) by mouth daily.     Endocrinology:  Hypothyroid Agents Failed - 11/10/2021 12:54 PM      Failed - TSH in normal range and within 360 days    TSH  Date Value Ref Range Status  02/11/2021 7.830 (H) 0.450 - 4.500 uIU/mL Final         Passed - Valid encounter within last 12 months    Recent Outpatient Visits          5 months ago Type 2 diabetes mellitus with diabetic polyneuropathy, without long-term current use of insulin [facility])   [place], [place] W, NP   9 months ago Type 2 diabetes mellitus with diabetic polyneuropathy, without long-term current use of insulin [facility])   [place], Vernia Buff, NP   12 months ago Postoperative hypothyroidism   [place], Vernia Buff, NP   1 year ago  Postoperative hypothyroidism   [place], [place] W, NP   1 year ago Postoperative hypothyroidism   [place], [place] W, NP              meloxicam (MOBIC) 15 MG tablet 30 tablet 0    Sig: Take 1 tablet (15 mg total) by mouth daily.     Analgesics:  COX2 Inhibitors Failed - 11/10/2021 12:54 PM      Failed - Manual Review: Labs are only required if the patient has taken medication for more than 8 weeks.      Failed - HGB in normal range and within 360 days    Hemoglobin  Date Value Ref Range Status  02/11/2021 12.8 (L) 13.0 - 17.7 g/dL Final         Failed - Cr in normal range and within 360 days    Creat  Date Value Ref Range Status  06/29/2016 1.24 0.70 - 1.33 mg/dL Final    Comment:      For patients > or = 63 years of age: The upper reference limit for Creatinine is approximately 13% higher for people identified as African-American.      Creatinine, Ser  Date Value  Ref Range Status  05/25/2021 1.35 (H) 0.76 - 1.27 mg/dL Final   Creatinine, Urine  Date Value Ref Range Status  06/29/2016 83 20 - 370 mg/dL Final         Passed - HCT in normal range and within 360 days    Hematocrit  Date Value Ref Range Status  02/11/2021 41.0 37.5 - 51.0 % Final         Passed - AST in normal range and within 360 days    AST  Date Value Ref Range Status  05/25/2021 15 0 - 40 IU/L Final         Passed - ALT in normal range and within 360 days    ALT  Date Value Ref Range Status  05/25/2021 16 0 - 44 IU/L Final         Passed - eGFR is 30 or above and within 360 days    GFR, Est African American  Date Value Ref Range Status  06/29/2016 74 >=60 mL/min Final   GFR calc Af Amer  Date Value Ref Range Status  06/10/2020 77 >59 mL/min/1.73 Final    Comment:    **[facility] currently reports eGFR in compliance with the current**   recommendations of the [employer]. [facility] will    update reporting as new guidelines are published from the NKF-ASN   Task force.    GFR, Est Non African American  Date Value Ref Range Status  06/29/2016 64 >=60 mL/min Final   GFR calc non Af Amer  Date Value Ref Range Status  06/10/2020 67 >59 mL/min/1.73 Final   eGFR  Date Value Ref Range Status  05/25/2021 59 (L) >59 mL/min/1.73 Final         Passed - Patient is not pregnant      Passed - Valid encounter within last 12 months    Recent Outpatient Visits          5 months ago Type 2 diabetes mellitus with diabetic polyneuropathy, without long-term current use of insulin ([place])   [place], [place] W, NP   9 months ago Type 2 diabetes mellitus with diabetic polyneuropathy, without long-term current use of insulin [facility])   [place], Vernia Buff, NP   12 months ago Postoperative hypothyroidism   [place], Vernia Buff, NP   1 year ago Postoperative hypothyroidism   [place], Vernia Buff, NP   1 year ago Postoperative hypothyroidism   [place], Vernia Buff, NP

## 2021-11-14 ENCOUNTER — Other Ambulatory Visit: Payer: Self-pay

## 2021-12-08 ENCOUNTER — Other Ambulatory Visit: Payer: Self-pay | Admitting: Nurse Practitioner

## 2021-12-11 ENCOUNTER — Other Ambulatory Visit: Payer: Self-pay | Admitting: Family Medicine

## 2021-12-11 DIAGNOSIS — E890 Postprocedural hypothyroidism: Secondary | ICD-10-CM

## 2021-12-13 NOTE — Telephone Encounter (Signed)
Requested medication (s) are due for refill today: yes ? ?Requested medication (s) are on the active medication list: yes ? ?Last refill:  06/19/21 #12 0 refills ? ?Future visit scheduled: yes in 2 months ? ?Notes to clinic:  do you want to refill Rx?  ? ? ?  ?Requested Prescriptions  ?Pending Prescriptions Disp Refills  ? Vitamin D, Ergocalciferol, (DRISDOL) 1.25 MG (50000 UNIT) CAPS capsule 12 capsule 0  ?  Sig: Take 1 capsule (50,000 Units total) by mouth every 7 (seven) days.  ?  ? Endocrinology:  Vitamins - Vitamin D Supplementation 2 Failed - 12/13/2021  9:38 AM  ?  ?  Failed - Manual Review: Route requests for 50,000 IU strength to the provider  ?  ?  Failed - Vitamin D in normal range and within 360 days  ?  Vit D, 25-Hydroxy  ?Date Value Ref Range Status  ?01/01/2019 15.0 (L) 30.0 - 100.0 ng/mL Final  ?  Comment:  ?  Vitamin D deficiency has been defined by the Institute of ?Medicine and an Endocrine Society practice guideline as a ?level of serum 25-OH vitamin D less than 20 ng/mL (1,2). ?The Endocrine Society went on to further define vitamin D ?insufficiency as a level between 21 and 29 ng/mL (2). ?1. IOM (Institute of Medicine). 2010. Dietary reference ?   intakes for calcium and D. [place]: The ?   [employer]. ?2. Holick MF, Binkley [place], Bischoff-Ferrari HA, et al. ?   Evaluation, treatment, and prevention of vitamin D ?   deficiency: an Endocrine Society clinical practice ?   guideline. JCEM. 2011 Jul; 96(7):1911-30. ?  ?  ?  ?  ?  Passed - Ca in normal range and within 360 days  ?  Calcium  ?Date Value Ref Range Status  ?05/25/2021 9.6 8.6 - 10.2 mg/dL Final  ?  ?  ?  ?  Passed - Valid encounter within last 12 months  ?  Recent Outpatient Visits   ? ?      ? 7 months ago Type 2 diabetes mellitus with diabetic polyneuropathy, without long-term current use of insulin ([place])  ? [place], [place] W, NP  ? 10 months ago Type 2 diabetes mellitus with  diabetic polyneuropathy, without long-term current use of insulin ([place])  ? [place], Vernia Buff, NP  ? 1 year ago Postoperative hypothyroidism  ? [place], Vernia Buff, NP  ? 1 year ago Postoperative hypothyroidism  ? [place], Vernia Buff, NP  ? 1 year ago Postoperative hypothyroidism  ? [place] Gildardo Pounds, NP  ? ?  ?  ?Future Appointments   ? ?        ? In 2 months Gildardo Pounds, NP [place]  ? ?  ? ?  ?  ?  ?Signed Prescriptions Disp Refills  ? levothyroxine (SYNTHROID) 125 MCG tablet 30 tablet 0  ?  Sig: TAKE 1 TABLET BY MOUTH ONCE DAILY NEEDS  OFFICE  VISIT  ?  ? Endocrinology:  Hypothyroid Agents Failed - 12/13/2021  9:38 AM  ?  ?  Failed - TSH in normal range and within 360 days  ?  TSH  ?Date Value Ref Range Status  ?02/11/2021 7.830 (H) 0.450 - 4.500 uIU/mL Final  ?  ?  ?  ?  Passed - Valid encounter within last 12 months  ?  Recent Outpatient Visits   ? ?      ? 7 months ago Type 2 diabetes mellitus with diabetic polyneuropathy, without long-term current use of insulin ([place])  ? [place], [place] W, NP  ? 10 months ago Type 2 diabetes mellitus with diabetic polyneuropathy, without long-term current use of insulin ([place])  ? [place], Vernia Buff, NP  ? 1 year ago Postoperative hypothyroidism  ? [place], Vernia Buff, NP  ? 1 year ago Postoperative hypothyroidism  ? [place], Vernia Buff, NP  ? 1 year ago Postoperative hypothyroidism  ? [place] Gildardo Pounds, NP  ? ?  ?  ?Future Appointments   ? ?        ? In 2 months Gildardo Pounds, NP [place]  ? ?  ? ?  ?  ?  ? ?

## 2021-12-13 NOTE — Telephone Encounter (Signed)
Requested Prescriptions  ?Pending Prescriptions Disp Refills  ?? levothyroxine (SYNTHROID) 125 MCG tablet [Pharmacy Med Name: Levothyroxine Sodium 125 MCG Oral Tablet] 30 tablet 0  ?  Sig: TAKE 1 TABLET BY MOUTH ONCE DAILY NEEDS  OFFICE  VISIT  ?  ? Endocrinology:  Hypothyroid Agents Failed - 12/13/2021  9:38 AM  ?  ?  Failed - TSH in normal range and within 360 days  ?  TSH  ?Date Value Ref Range Status  ?02/11/2021 7.830 (H) 0.450 - 4.500 uIU/mL Final  ?   ?  ?  Passed - Valid encounter within last 12 months  ?  Recent Outpatient Visits   ?      ? 7 months ago Type 2 diabetes mellitus with diabetic polyneuropathy, without long-term current use of insulin ([place])  ? [place], [place] W, NP  ? 10 months ago Type 2 diabetes mellitus with diabetic polyneuropathy, without long-term current use of insulin ([place])  ? [place], Vernia Buff, NP  ? 1 year ago Postoperative hypothyroidism  ? [place], Vernia Buff, NP  ? 1 year ago Postoperative hypothyroidism  ? [place], Vernia Buff, NP  ? 1 year ago Postoperative hypothyroidism  ? [place] Gildardo Pounds, NP  ?  ?  ?Future Appointments   ?        ? In 2 months Gildardo Pounds, NP [place]  ?  ? ?  ?  ?  ?? Vitamin D, Ergocalciferol, (DRISDOL) 1.25 MG (50000 UNIT) CAPS capsule 12 capsule 0  ?  Sig: Take 1 capsule (50,000 Units total) by mouth every 7 (seven) days.  ?  ? Endocrinology:  Vitamins - Vitamin D Supplementation 2 Failed - 12/13/2021  9:38 AM  ?  ?  Failed - Manual Review: Route requests for 50,000 IU strength to the provider  ?  ?  Failed - Vitamin D in normal range and within 360 days  ?  Vit D, 25-Hydroxy  ?Date Value Ref Range Status  ?01/01/2019 15.0 (L) 30.0 - 100.0 ng/mL Final  ?  Comment:  ?  Vitamin D deficiency has been defined by the Institute  of ?Medicine and an Endocrine Society practice guideline as a ?level of serum 25-OH vitamin D less than 20 ng/mL (1,2). ?The Endocrine Society went on to further define vitamin D ?insufficiency as a level between 21 and 29 ng/mL (2). ?1. IOM (Institute of Medicine). 2010. Dietary reference ?   intakes for calcium and D. [place]: The ?   [employer]. ?2. Holick MF, Binkley [place], Bischoff-Ferrari HA, et al. ?   Evaluation, treatment, and prevention of vitamin D ?   deficiency: an Endocrine Society clinical practice ?   guideline. JCEM. 2011 Jul; 96(7):1911-30. ?  ?   ?  ?  Passed - Ca in normal range and within 360 days  ?  Calcium  ?Date Value Ref Range Status  ?05/25/2021 9.6 8.6 - 10.2 mg/dL Final  ?   ?  ?  Passed - Valid encounter within last 12 months  ?  Recent Outpatient Visits   ?      ? 7 months ago Type 2 diabetes mellitus with diabetic polyneuropathy, without long-term current use of insulin ([place])  ? [place] Gildardo Pounds, NP  ?  10 months ago Type 2 diabetes mellitus with diabetic polyneuropathy, without long-term current use of insulin ([place])  ? [place], Vernia Buff, NP  ? 1 year ago Postoperative hypothyroidism  ? [place], Vernia Buff, NP  ? 1 year ago Postoperative hypothyroidism  ? [place], Vernia Buff, NP  ? 1 year ago Postoperative hypothyroidism  ? [place] Gildardo Pounds, NP  ?  ?  ?Future Appointments   ?        ? In 2 months Gildardo Pounds, NP [place]  ?  ? ?  ?  ?  ? ?

## 2022-01-26 ENCOUNTER — Other Ambulatory Visit: Payer: Self-pay

## 2022-01-26 ENCOUNTER — Other Ambulatory Visit: Payer: Self-pay | Admitting: Nurse Practitioner

## 2022-01-26 DIAGNOSIS — E890 Postprocedural hypothyroidism: Secondary | ICD-10-CM

## 2022-01-26 NOTE — Telephone Encounter (Signed)
Requested medication (s) are due for refill today - yes ? ?Requested medication (s) are on the active medication list -yes ? ?Future visit scheduled -yes ? ?Last refill: 12/13/21 330 ? ?Notes to clinic: Request RF: patient has appointment scheduled- but Rx has notes appointment required before future fills- request sent for review   ? ?Requested Prescriptions  ?Pending Prescriptions Disp Refills  ? levothyroxine (SYNTHROID) 125 MCG tablet [Pharmacy Med Name: Levothyroxine Sodium 125 MCG Oral Tablet] 30 tablet 0  ?  Sig: TAKE 1 TABLET BY MOUTH ONCE DAILY AS NEEDED . APPOINTMENT REQUIRED FOR FUTURE REFILLS  ?  ? Endocrinology:  Hypothyroid Agents Failed - 01/26/2022 11:54 AM  ?  ?  Failed - TSH in normal range and within 360 days  ?  TSH  ?Date Value Ref Range Status  ?02/11/2021 7.830 (H) 0.450 - 4.500 uIU/mL Final  ?  ?  ?  ?  Passed - Valid encounter within last 12 months  ?  Recent Outpatient Visits   ? ?      ? 8 months ago Type 2 diabetes mellitus with diabetic polyneuropathy, without long-term current use of insulin ([place])  ? [place], [place] W, NP  ? 11 months ago Type 2 diabetes mellitus with diabetic polyneuropathy, without long-term current use of insulin ([place])  ? [place], Vernia Buff, NP  ? 1 year ago Postoperative hypothyroidism  ? [place], Vernia Buff, NP  ? 1 year ago Postoperative hypothyroidism  ? [place], Vernia Buff, NP  ? 1 year ago Postoperative hypothyroidism  ? [place] Gildardo Pounds, NP  ? ?  ?  ?Future Appointments   ? ?        ? In 3 weeks Gildardo Pounds, NP [place]  ? ?  ? ? ?  ?  ?  ? ? ? ?Requested Prescriptions  ?Pending Prescriptions Disp Refills  ? levothyroxine (SYNTHROID) 125 MCG tablet [Pharmacy Med Name: Levothyroxine Sodium 125 MCG Oral Tablet] 30 tablet 0  ?   Sig: TAKE 1 TABLET BY MOUTH ONCE DAILY AS NEEDED . APPOINTMENT REQUIRED FOR FUTURE REFILLS  ?  ? Endocrinology:  Hypothyroid Agents Failed - 01/26/2022 11:54 AM  ?  ?  Failed - TSH in normal range and within 360 days  ?  TSH  ?Date Value Ref Range Status  ?02/11/2021 7.830 (H) 0.450 - 4.500 uIU/mL Final  ?  ?  ?  ?  Passed - Valid encounter within last 12 months  ?  Recent Outpatient Visits   ? ?      ? 8 months ago Type 2 diabetes mellitus with diabetic polyneuropathy, without long-term current use of insulin ([place])  ? [place], [place] W, NP  ? 11 months ago Type 2 diabetes mellitus with diabetic polyneuropathy, without long-term current use of insulin ([place])  ? [place], Vernia Buff, NP  ? 1 year ago Postoperative hypothyroidism  ? [place], Vernia Buff, NP  ? 1 year ago Postoperative hypothyroidism  ? [place], Vernia Buff, NP  ? 1 year ago Postoperative hypothyroidism  ? [place] Gildardo Pounds, NP  ? ?  ?  ?Future Appointments   ? ?        ?  In 3 weeks Gildardo Pounds, NP [place]  ? ?  ? ? ?  ?  ?  ? ? ? ?

## 2022-02-03 ENCOUNTER — Other Ambulatory Visit: Payer: Self-pay | Admitting: Nurse Practitioner

## 2022-02-03 ENCOUNTER — Other Ambulatory Visit: Payer: Self-pay

## 2022-02-03 DIAGNOSIS — E890 Postprocedural hypothyroidism: Secondary | ICD-10-CM

## 2022-02-03 MED ORDER — LEVOTHYROXINE SODIUM 125 MCG PO TABS
ORAL_TABLET | ORAL | 0 refills | Status: DC
  Filled 2022-02-03: qty 30, 30d supply, fill #0

## 2022-02-03 NOTE — Telephone Encounter (Signed)
Patient came in requesting levothyroxine (SYNTHROID) 125 MCG Tablet, until next appointment. ?

## 2022-02-07 ENCOUNTER — Encounter: Payer: Self-pay | Admitting: Nurse Practitioner

## 2022-02-07 ENCOUNTER — Other Ambulatory Visit: Payer: Self-pay

## 2022-02-07 ENCOUNTER — Other Ambulatory Visit: Payer: Self-pay | Admitting: Nurse Practitioner

## 2022-02-07 ENCOUNTER — Telehealth: Payer: Self-pay | Admitting: Nurse Practitioner

## 2022-02-07 ENCOUNTER — Other Ambulatory Visit: Payer: Self-pay | Admitting: Endocrinology

## 2022-02-07 ENCOUNTER — Ambulatory Visit: Payer: Self-pay | Attending: Nurse Practitioner | Admitting: Nurse Practitioner

## 2022-02-07 DIAGNOSIS — E1142 Type 2 diabetes mellitus with diabetic polyneuropathy: Secondary | ICD-10-CM

## 2022-02-07 DIAGNOSIS — E890 Postprocedural hypothyroidism: Secondary | ICD-10-CM

## 2022-02-07 DIAGNOSIS — D649 Anemia, unspecified: Secondary | ICD-10-CM

## 2022-02-07 DIAGNOSIS — E785 Hyperlipidemia, unspecified: Secondary | ICD-10-CM

## 2022-02-07 MED ORDER — METFORMIN HCL 1000 MG PO TABS
1000.0000 mg | ORAL_TABLET | Freq: Two times a day (BID) | ORAL | 3 refills | Status: DC
  Filled 2022-02-07: qty 180, 90d supply, fill #0

## 2022-02-07 NOTE — Telephone Encounter (Signed)
No answer. LVM ?

## 2022-02-07 NOTE — Progress Notes (Signed)
Virtual Visit via Telephone Note ? I discussed the limitations, risks, security and privacy concerns of performing an evaluation and management service by telephone and the availability of in person appointments. I also discussed with the patient that there may be a patient responsible charge related to this service. The patient expressed understanding and agreed to proceed.  ? ? ?I connected with Joe Wallace on 02/07/22  at   2:10 PM EDT  EDT by telephone and verified that I am speaking with the correct person using two identifiers. ? ?Location of Patient: ?Private Residence ?  ?Location of Provider: ?Scientist, research (physical sciences) and [employer] Office  ?  ?Persons participating in Telemedicine visit: ?[name] FNP-BC ?Loistine Simas [place]  ?  ?History of Present Illness: ?Telemedicine visit for: medication refills ? ?Requesting refills of metformin and levothyroxine. Thyroid level is not at goal. He has post operative hypothyroidism. I have not seen him in the office since last May 2022. Diabetes is poorly controlled as well. I will see him in the office in a few weeks for blood pressure check.  ?Lab Results  ?Component Value Date  ? TSH 7.830 (H) 02/11/2021  ?  ?Lab Results  ?Component Value Date  ? HGBA1C 8.4 (H) 05/25/2021  ?  ? ? ?Past Medical History:  ?Diagnosis Date  ? Arthritis   ? 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 Depression   ? Diabetes mellitus without complication ([place])   ? Headache   ? Hyperlipidemia   ? Memory loss   ? Thyroid neoplasm   ? 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  ?Past Surgical History:  ?Procedure Laterality Date  ? head trauma    ? L sided fragment from war.  ? SHOULDER ARTHROSCOPY    ? THYROIDECTOMY N/A 08/13/2015  ? Procedure: TOTAL THYROIDECTOMY;  Surgeon: Armandina Gemma, MD;  Location: [facility];  Service: General;  Laterality: N/A;  ?  ?Family History  ?Problem Relation Age of Onset  ? Diabetes Mother   ? Hypertension Mother   ? Hypertension Father   ? Diabetes Father    ? Colon cancer Neg Hx   ? Colon polyps Neg Hx   ? Esophageal cancer Neg Hx   ? Rectal cancer Neg Hx   ? Stomach cancer Neg Hx   ?  ?Social History  ? ?Socioeconomic History  ? Marital status: Married  ?  Spouse name: Not on file  ? Number of children: Not on file  ? Years of education: Not on file  ? Highest education level: Not on file  ?Occupational History  ? Not on file  ?Tobacco Use  ? Smoking status: Never  ? Smokeless tobacco: Never  ?Vaping Use  ? Vaping Use: Never used  ?Substance and Sexual Activity  ? Alcohol use: No  ? Drug use: No  ? Sexual activity: Yes  ?Other Topics Concern  ? Not on file  ?Social History Narrative  ? Lives home with wife and 7 boyss, [place].  Does not work, SSI.  Education: middle school.  Soda once daily.   ? ?Social Determinants of Health  ? ?Financial Resource Strain: Not on file  ?Food Insecurity: Not on file  ?Transportation Needs: Not on file  ?Physical Activity: Not on file  ?Stress: Not on file  ?Social Connections: Not on file  ?  ? ?Observations/Objective: ?Awake, alert and oriented x 3 ? ? ?Review of Systems  ?Constitutional:  Negative for fever, malaise/fatigue and weight loss.  ?HENT: Negative.  Negative for nosebleeds.   ?Eyes: Negative.  Negative for blurred vision, double vision and photophobia.  ?Respiratory: Negative.  Negative for cough and shortness of breath.   ?Cardiovascular: Negative.  Negative for chest pain, palpitations and leg swelling.  ?Gastrointestinal: Negative.  Negative for heartburn, nausea and vomiting.  ?Musculoskeletal: Negative.  Negative for myalgias.  ?Neurological: Negative.  Negative for dizziness, focal weakness, seizures and headaches.  ?Psychiatric/Behavioral: Negative.  Negative for suicidal ideas.    ?Assessment and Plan: ?Diagnoses and all orders for this visit: ? ?Type 2 diabetes mellitus with diabetic polyneuropathy, without long-term current use of insulin (HCC) ?-     metFORMIN (GLUCOPHAGE) 1000 MG  tablet; Take 1 tablet (1,000 mg total) by mouth 2 (two) times daily with a meal. ? ?Postoperative hypothyroidism ?Levothyroxine has been sent to pharmacy for pic up.  ?  ? ?Follow Up Instructions ?Return in about 4 weeks (around 03/07/2022).  ? ?  ?I discussed the assessment and treatment plan with the patient. The patient was provided an opportunity to ask questions and all were answered. The patient agreed with the plan and demonstrated an understanding of the instructions. ?  ?The patient was advised to call back or seek an in-person evaluation if the symptoms worsen or if the condition fails to improve as anticipated. ? ?I provided 10 minutes of non-face-to-face time during this encounter including median intraservice time, reviewing previous notes, labs, imaging, medications and explaining diagnosis and management. ? ?Gildardo Pounds, FNP-BC  ?

## 2022-02-08 ENCOUNTER — Other Ambulatory Visit: Payer: Self-pay

## 2022-02-08 LAB — CBC WITH DIFFERENTIAL/PLATELET
Basophils Absolute: 0.1 10*3/uL (ref 0.0–0.2)
Basos: 1 %
EOS (ABSOLUTE): 0.2 10*3/uL (ref 0.0–0.4)
Eos: 3 %
Hematocrit: 40.6 % (ref 37.5–51.0)
Hemoglobin: 12.6 g/dL — ABNORMAL LOW (ref 13.0–17.7)
Immature Grans (Abs): 0 10*3/uL (ref 0.0–0.1)
Immature Granulocytes: 1 %
Lymphocytes Absolute: 1.9 10*3/uL (ref 0.7–3.1)
Lymphs: 28 %
MCH: 26.2 pg — ABNORMAL LOW (ref 26.6–33.0)
MCHC: 31 g/dL — ABNORMAL LOW (ref 31.5–35.7)
MCV: 84 fL (ref 79–97)
Monocytes Absolute: 0.4 10*3/uL (ref 0.1–0.9)
Monocytes: 6 %
Neutrophils Absolute: 4.2 10*3/uL (ref 1.4–7.0)
Neutrophils: 61 %
Platelets: 243 10*3/uL (ref 150–450)
RBC: 4.81 x10E6/uL (ref 4.14–5.80)
RDW: 12.2 % (ref 11.6–15.4)
WBC: 6.9 10*3/uL (ref 3.4–10.8)

## 2022-02-08 LAB — CMP14+EGFR
ALT: 18 IU/L (ref 0–44)
AST: 15 IU/L (ref 0–40)
Albumin/Globulin Ratio: 1.6 (ref 1.2–2.2)
Albumin: 4.4 g/dL (ref 3.8–4.8)
Alkaline Phosphatase: 66 IU/L (ref 44–121)
BUN/Creatinine Ratio: 16 (ref 10–24)
BUN: 19 mg/dL (ref 8–27)
Bilirubin Total: 0.3 mg/dL (ref 0.0–1.2)
CO2: 26 mmol/L (ref 20–29)
Calcium: 9.4 mg/dL (ref 8.6–10.2)
Chloride: 99 mmol/L (ref 96–106)
Creatinine, Ser: 1.2 mg/dL (ref 0.76–1.27)
Globulin, Total: 2.8 g/dL (ref 1.5–4.5)
Glucose: 242 mg/dL — ABNORMAL HIGH (ref 70–99)
Potassium: 4.7 mmol/L (ref 3.5–5.2)
Sodium: 138 mmol/L (ref 134–144)
Total Protein: 7.2 g/dL (ref 6.0–8.5)
eGFR: 68 mL/min/1.73 (ref >59)

## 2022-02-08 LAB — MICROALBUMIN / CREATININE URINE RATIO
Creatinine, Urine: 71.6 mg/dL
Microalb/Creat Ratio: 83 mg/g{creat} — ABNORMAL HIGH (ref 0–29)
Microalbumin, Urine: 59.5 ug/mL

## 2022-02-08 LAB — THYROID PANEL WITH TSH
Free Thyroxine Index: 0.5 — ABNORMAL LOW (ref 1.2–4.9)
T3 Uptake Ratio: 18 % — ABNORMAL LOW (ref 24–39)
T4, Total: 2.8 ug/dL — ABNORMAL LOW (ref 4.5–12.0)
TSH: 43.8 u[IU]/mL — ABNORMAL HIGH (ref 0.450–4.500)

## 2022-02-08 LAB — LIPID PANEL
Chol/HDL Ratio: 3.1 ratio (ref 0.0–5.0)
Cholesterol, Total: 146 mg/dL (ref 100–199)
HDL: 47 mg/dL (ref >39)
LDL Chol Calc (NIH): 71 mg/dL (ref 0–99)
Triglycerides: 164 mg/dL — ABNORMAL HIGH (ref 0–149)
VLDL Cholesterol Cal: 28 mg/dL (ref 5–40)

## 2022-02-08 LAB — HEMOGLOBIN A1C
Est. average glucose Bld gHb Est-mCnc: 192 mg/dL
Hgb A1c MFr Bld: 8.3 % — ABNORMAL HIGH (ref 4.8–5.6)

## 2022-02-09 ENCOUNTER — Other Ambulatory Visit: Payer: Self-pay

## 2022-02-11 ENCOUNTER — Other Ambulatory Visit: Payer: Self-pay | Admitting: Nurse Practitioner

## 2022-02-11 DIAGNOSIS — E782 Mixed hyperlipidemia: Secondary | ICD-10-CM

## 2022-02-11 DIAGNOSIS — E785 Hyperlipidemia, unspecified: Secondary | ICD-10-CM

## 2022-02-11 DIAGNOSIS — F419 Anxiety disorder, unspecified: Secondary | ICD-10-CM

## 2022-02-11 DIAGNOSIS — E1142 Type 2 diabetes mellitus with diabetic polyneuropathy: Secondary | ICD-10-CM

## 2022-02-11 MED ORDER — FENOFIBRATE 145 MG PO TABS
145.0000 mg | ORAL_TABLET | Freq: Every day | ORAL | 1 refills | Status: DC
  Filled 2022-02-11: qty 90, 90d supply, fill #0

## 2022-02-11 MED ORDER — EMPAGLIFLOZIN 25 MG PO TABS
25.0000 mg | ORAL_TABLET | Freq: Every day | ORAL | 6 refills | Status: DC
Start: 2022-02-11 — End: 2022-02-17
  Filled 2022-02-11: qty 30, 30d supply, fill #0

## 2022-02-11 MED ORDER — GLIPIZIDE 10 MG PO TABS
10.0000 mg | ORAL_TABLET | Freq: Every day | ORAL | 0 refills | Status: DC
  Filled 2022-02-11: qty 30, 30d supply, fill #0

## 2022-02-11 MED ORDER — TRUE METRIX BLOOD GLUCOSE TEST VI STRP
ORAL_STRIP | 12 refills | Status: DC
  Filled 2022-02-11: qty 100, fill #0

## 2022-02-11 MED ORDER — METFORMIN HCL 1000 MG PO TABS
1000.0000 mg | ORAL_TABLET | Freq: Two times a day (BID) | ORAL | 3 refills | Status: DC
  Filled 2022-02-11: qty 180, 90d supply, fill #0

## 2022-02-11 MED ORDER — TRUEPLUS LANCETS 28G MISC
3 refills | Status: DC
Start: 2022-02-11 — End: 2022-08-30
  Filled 2022-02-11: qty 100, 33d supply, fill #0

## 2022-02-11 MED ORDER — OMEGA-3-ACID ETHYL ESTERS 1 G PO CAPS
1.0000 g | ORAL_CAPSULE | Freq: Two times a day (BID) | ORAL | 1 refills | Status: DC
  Filled 2022-02-11: qty 60, 30d supply, fill #0

## 2022-02-11 MED ORDER — ESCITALOPRAM OXALATE 10 MG PO TABS
10.0000 mg | ORAL_TABLET | Freq: Every day | ORAL | 0 refills | Status: DC
Start: 2022-02-11 — End: 2022-05-24
  Filled 2022-02-11: qty 30, 30d supply, fill #0

## 2022-02-13 ENCOUNTER — Other Ambulatory Visit: Payer: Self-pay

## 2022-02-15 ENCOUNTER — Other Ambulatory Visit: Payer: Self-pay

## 2022-02-17 ENCOUNTER — Encounter: Payer: Self-pay | Admitting: Nurse Practitioner

## 2022-02-17 ENCOUNTER — Ambulatory Visit: Payer: Self-pay | Attending: Nurse Practitioner | Admitting: Nurse Practitioner

## 2022-02-17 ENCOUNTER — Other Ambulatory Visit: Payer: Self-pay | Admitting: Pharmacist

## 2022-02-17 ENCOUNTER — Other Ambulatory Visit: Payer: Self-pay

## 2022-02-17 VITALS — BP 126/78 | HR 66 | Wt 216.6 lb

## 2022-02-17 DIAGNOSIS — F4310 Post-traumatic stress disorder, unspecified: Secondary | ICD-10-CM

## 2022-02-17 DIAGNOSIS — Z1211 Encounter for screening for malignant neoplasm of colon: Secondary | ICD-10-CM

## 2022-02-17 DIAGNOSIS — F331 Major depressive disorder, recurrent, moderate: Secondary | ICD-10-CM

## 2022-02-17 DIAGNOSIS — E559 Vitamin D deficiency, unspecified: Secondary | ICD-10-CM

## 2022-02-17 DIAGNOSIS — M79601 Pain in right arm: Secondary | ICD-10-CM

## 2022-02-17 DIAGNOSIS — Z760 Encounter for issue of repeat prescription: Secondary | ICD-10-CM

## 2022-02-17 DIAGNOSIS — E890 Postprocedural hypothyroidism: Secondary | ICD-10-CM

## 2022-02-17 MED ORDER — VITAMIN D (ERGOCALCIFEROL) 1.25 MG (50000 UNIT) PO CAPS
50000.0000 [IU] | ORAL_CAPSULE | ORAL | 0 refills | Status: DC
  Filled 2022-02-17: qty 12, 84d supply, fill #0

## 2022-02-17 MED ORDER — QUETIAPINE FUMARATE 100 MG PO TABS
100.0000 mg | ORAL_TABLET | Freq: Every day | ORAL | 1 refills | Status: DC
  Filled 2022-02-17: qty 30, 30d supply, fill #0

## 2022-02-17 MED ORDER — EMPAGLIFLOZIN 25 MG PO TABS
25.0000 mg | ORAL_TABLET | Freq: Every day | ORAL | 6 refills | Status: DC
  Filled 2022-02-17: qty 30, 30d supply, fill #0

## 2022-02-17 MED ORDER — LIDOCAINE 5 % EX PTCH
1.0000 | MEDICATED_PATCH | CUTANEOUS | 1 refills | Status: DC
  Filled 2022-02-17: qty 30, 30d supply, fill #0

## 2022-02-17 MED ORDER — LEVOTHYROXINE SODIUM 125 MCG PO TABS
ORAL_TABLET | ORAL | 1 refills | Status: DC
  Filled 2022-02-17: qty 30, fill #0

## 2022-02-17 MED ORDER — EMPAGLIFLOZIN 25 MG PO TABS: 25.0000 mg | ORAL_TABLET | Freq: Every day | ORAL | 1 refills | Status: DC

## 2022-02-17 NOTE — Progress Notes (Signed)
Assessment & Plan:  Joe Wallace was seen today for medication management.  Diagnoses and all orders for this visit:  Right arm pain -     lidocaine (LIDODERM) 5 %; Place 1 patch onto the skin daily. Remove & Discard patch as directed by MD (12 hours on 12 hours off)  Vitamin D deficiency disease -     Vitamin D, Ergocalciferol, (DRISDOL) 1.25 MG (50000 UNIT) CAPS capsule; Take 1 capsule (50,000 Units total) by mouth every 7 (seven) days.  Colon cancer screening -     Fecal occult blood, imunochemical([facility])  DM 2 empagliflozin (JARDIANCE) 25 MG TABS tablet; Take 1 tablet (25 mg total) by mouth daily before breakfast.     Patient has been counseled on age-appropriate routine health concerns for screening and prevention. These are reviewed and up-to-date. Referrals have been placed accordingly. Immunizations are up-to-date or declined.    Subjective:   Chief Complaint  Patient presents with   Medication Management   HPI Joe Wallace 62 y.o. male presents to office today for medication refills and med management.   He has a past medical history of Arthritis, Cancer ([place]) (2017), Depression, Diabetes mellitus without complication ([place]), Headache, Hyperlipidemia, Memory loss, Thyroid neoplasm, and Visual disorder  Was previously Seeing DR. Afreen [facility]) and Dr. Leta Baptist (Neuro) for memory loss likely related to depression, PTSD, stress. Currently taking seroquel and sertraline 100 mg daily for depression and also endorses insomnia.   Endorses right bicep and tricep pain consistent with tendinitis. He denies any injury, trauma or heavy lifting. He is able to lift his right arm above his right shoulder Sometimes has to use his left hand to raise his right arm due to the pain. Onset 1 month ago. He has not tried anything for pain.    DM 2 Diabetes not at goal. He is not taking jardiance. He is not aware when he ran out. Still taking glipizide 10 mg daily, metformin 1000  mg BID. LDL near goal with atorvastatin 40 mg daily and fenofibrate 145 mg daily.  Lab Results  Component Value Date   HGBA1C 8.3 (H) 02/07/2022   Lab Results  Component Value Date   LDLCALC 71 02/07/2022      Hypothyroidism Thyroid level is not at goal. He has been out of his levothyroxine for several weeks. Only restarted it recently prior to this appointment. He denies any symptoms of hypo or hyperthyroidism Lab Results  Component Value Date   TSH 43.800 (H) 02/07/2022     Review of Systems  Constitutional:  Negative for fever, malaise/fatigue and weight loss.  HENT: Negative.  Negative for nosebleeds.   Eyes: Negative.  Negative for blurred vision, double vision and photophobia.  Respiratory: Negative.  Negative for cough and shortness of breath.   Cardiovascular: Negative.  Negative for chest pain, palpitations and leg swelling.  Gastrointestinal: Negative.  Negative for heartburn, nausea and vomiting.  Musculoskeletal: Negative.  Negative for myalgias.  Neurological: Negative.  Negative for dizziness, focal weakness, seizures and headaches.  Psychiatric/Behavioral: Negative.  Negative for suicidal ideas.    Past Medical History:  Diagnosis Date   Arthritis    Cancer ([place]) 2017   in the throat (per pt chart)   Depression    Diabetes mellitus without complication ([place])    Headache    Hyperlipidemia    Memory loss    Thyroid neoplasm    Visual disorder    LEFT EYE    Past Surgical History:  Procedure Laterality  Date   head trauma     L sided fragment from war.   SHOULDER ARTHROSCOPY     THYROIDECTOMY N/A 08/13/2015   Procedure: TOTAL THYROIDECTOMY;  Surgeon: Armandina Gemma, MD;  Location: [facility];  Service: General;  Laterality: N/A;    Family History  Problem Relation Age of Onset   Diabetes Mother    Hypertension Mother    Hypertension Father    Diabetes Father    Colon cancer Neg Hx    Colon polyps Neg Hx    Esophageal cancer Neg Hx    Rectal cancer Neg  Hx    Stomach cancer Neg Hx     Social History Reviewed with no changes to be made today.   Outpatient Medications Prior to Visit  Medication Sig Dispense Refill   Accu-Chek FastClix Lancets MISC Use to check blood sugar twice daily. E11.42 102 each 11   acetaminophen (TYLENOL) 500 MG tablet Take 1 tablet (500 mg total) by mouth every 6 (six) hours as needed. 30 tablet 0   aspirin 81 MG tablet Take 81 mg by mouth daily.     atorvastatin (LIPITOR) 40 MG tablet Take 1 tablet (40 mg total) by mouth daily. 90 tablet 3   Blood Glucose Monitoring Suppl (ACCU-CHEK GUIDE ME) w/Device KIT 1 kit by Does not apply route 2 (two) times daily. Use to check blood sugar twice daily. E11.42 1 kit 0   Blood Glucose Monitoring Suppl (TRUE METRIX METER) w/Device KIT Use as instructed. Check blood glucose level by fingerstick 2-3 times per day. 1 kit 0   escitalopram (LEXAPRO) 10 MG tablet Take 1 tablet (10 mg total) by mouth daily. 90 tablet 0   fenofibrate (TRICOR) 145 MG tablet Take 1 tablet (145 mg total) by mouth daily. 90 tablet 1   glipiZIDE (GLUCOTROL) 10 MG tablet Take 1 tablet (10 mg total) by mouth daily before breakfast. 90 tablet 0   glucose blood (ACCU-CHEK GUIDE) test strip USE 1  TWICE DAILY 100 each 0   glucose blood (TRUE METRIX BLOOD GLUCOSE TEST) test strip Use as instructed. Check blood glucose level by fingerstick 2-3 times per day. 100 each 12   Insulin Pen Needle (B-D UF III MINI PEN NEEDLES) 31G X 5 MM MISC Use as instructed. Inject into the skin once nightly. 100 each 6   meloxicam (MOBIC) 15 MG tablet Take 1 tablet (15 mg total) by mouth daily. 30 tablet 0   metFORMIN (GLUCOPHAGE) 1000 MG tablet Take 1 tablet (1,000 mg total) by mouth 2 (two) times daily with a meal. 180 tablet 3   TRUEplus Lancets 28G MISC Use as instructed. Check blood glucose level by fingerstick 2-3 times per day. 100 each 3   empagliflozin (JARDIANCE) 25 MG TABS tablet Take 1 tablet (25 mg total) by mouth daily  before breakfast. 30 tablet 6   levothyroxine (SYNTHROID) 125 MCG tablet TAKE 1 TABLET BY MOUTH ONCE DAILY NEEDS  OFFICE  VISIT 30 tablet 0   omega-3 acid ethyl esters (LOVAZA) 1 g capsule Take 1 capsule (1 g total) by mouth 2 (two) times daily. 180 capsule 1   QUEtiapine (SEROQUEL) 100 MG tablet Take 1 tablet (100 mg total) by mouth at bedtime. 30 tablet 1   Vitamin D, Ergocalciferol, (DRISDOL) 1.25 MG (50000 UNIT) CAPS capsule TAKE 1 CAPSULE BY MOUTH EVERY 7 DAYS 12 capsule 0   No facility-administered medications prior to visit.    No Known Allergies     Objective:  BP 126/78   Pulse 66   Wt 216 lb 9.6 oz (98.2 kg)   SpO2 97%   BMI 31.08 kg/m  Wt Readings from Last 3 Encounters:  02/17/22 216 lb 9.6 oz (98.2 kg)  02/11/21 226 lb 6.4 oz (102.7 kg)  11/19/20 224 lb 6.4 oz (101.8 kg)    Physical Exam Vitals and nursing note reviewed.  Constitutional:      Appearance: He is well-developed.  HENT:     Head: Normocephalic and atraumatic.  Cardiovascular:     Rate and Rhythm: Normal rate and regular rhythm.     Heart sounds: Normal heart sounds. No murmur heard.   No friction rub. No gallop.  Pulmonary:     Effort: Pulmonary effort is normal. No tachypnea or respiratory distress.     Breath sounds: Normal breath sounds. No decreased breath sounds, wheezing, rhonchi or rales.  Chest:     Chest wall: No tenderness.  Abdominal:     General: Bowel sounds are normal.     Palpations: Abdomen is soft.  Musculoskeletal:        General: Normal range of motion.     Cervical back: Normal range of motion.  Skin:    General: Skin is warm and dry.  Neurological:     Mental Status: He is alert and oriented to person, place, and time.     Coordination: Coordination normal.  Psychiatric:        Behavior: Behavior normal. Behavior is cooperative.        Thought Content: Thought content normal.        Judgment: Judgment normal.         Patient has been counseled extensively  about nutrition and exercise as well as the importance of adherence with medications and regular follow-up. The patient was given clear instructions to go to ER or return to medical center if symptoms don't improve, worsen or new problems develop. The patient verbalized understanding.   Follow-up: Return for 4 week lab appt TSH.  SEE ME after august 16th for  DM.   Gildardo Pounds, FNP-BC [facility] and [place], [place]   02/17/2022, 9:44 PM

## 2022-02-21 ENCOUNTER — Other Ambulatory Visit: Payer: Self-pay

## 2022-02-23 ENCOUNTER — Other Ambulatory Visit: Payer: Self-pay

## 2022-02-28 ENCOUNTER — Other Ambulatory Visit: Payer: Self-pay

## 2022-03-07 ENCOUNTER — Other Ambulatory Visit: Payer: Self-pay

## 2022-03-14 ENCOUNTER — Other Ambulatory Visit: Payer: Self-pay

## 2022-03-31 ENCOUNTER — Ambulatory Visit: Payer: Medicaid Other | Attending: Internal Medicine

## 2022-03-31 DIAGNOSIS — E890 Postprocedural hypothyroidism: Secondary | ICD-10-CM

## 2022-04-01 LAB — THYROID PANEL WITH TSH
Free Thyroxine Index: 1.5 (ref 1.2–4.9)
T3 Uptake Ratio: 24 % (ref 24–39)
T4, Total: 6.3 ug/dL (ref 4.5–12.0)
TSH: 16.1 u[IU]/mL — ABNORMAL HIGH (ref 0.450–4.500)

## 2022-04-03 ENCOUNTER — Other Ambulatory Visit: Payer: Self-pay | Admitting: Nurse Practitioner

## 2022-04-03 DIAGNOSIS — E890 Postprocedural hypothyroidism: Secondary | ICD-10-CM

## 2022-04-20 ENCOUNTER — Encounter: Payer: Self-pay | Admitting: Podiatry

## 2022-05-01 ENCOUNTER — Other Ambulatory Visit: Payer: Self-pay

## 2022-05-04 ENCOUNTER — Other Ambulatory Visit: Payer: Self-pay | Admitting: Nurse Practitioner

## 2022-05-04 DIAGNOSIS — E890 Postprocedural hypothyroidism: Secondary | ICD-10-CM

## 2022-05-15 ENCOUNTER — Ambulatory Visit: Payer: Self-pay | Admitting: Nurse Practitioner

## 2022-05-15 ENCOUNTER — Other Ambulatory Visit: Payer: Self-pay

## 2022-05-24 ENCOUNTER — Encounter: Payer: Self-pay | Admitting: Nurse Practitioner

## 2022-05-24 ENCOUNTER — Ambulatory Visit: Payer: Medicaid Other | Attending: Nurse Practitioner | Admitting: Nurse Practitioner

## 2022-05-24 VITALS — BP 133/79 | HR 64 | Temp 98.4°F | Ht 65.0 in | Wt 215.0 lb

## 2022-05-24 DIAGNOSIS — M25552 Pain in left hip: Secondary | ICD-10-CM | POA: Diagnosis not present

## 2022-05-24 DIAGNOSIS — E1142 Type 2 diabetes mellitus with diabetic polyneuropathy: Secondary | ICD-10-CM

## 2022-05-24 DIAGNOSIS — F419 Anxiety disorder, unspecified: Secondary | ICD-10-CM

## 2022-05-24 DIAGNOSIS — F32A Depression, unspecified: Secondary | ICD-10-CM | POA: Diagnosis not present

## 2022-05-24 DIAGNOSIS — E782 Mixed hyperlipidemia: Secondary | ICD-10-CM

## 2022-05-24 DIAGNOSIS — D649 Anemia, unspecified: Secondary | ICD-10-CM

## 2022-05-24 DIAGNOSIS — E890 Postprocedural hypothyroidism: Secondary | ICD-10-CM

## 2022-05-24 LAB — POCT GLYCOSYLATED HEMOGLOBIN (HGB A1C): Hemoglobin A1C: 9.5 % — AB (ref 4.0–5.6)

## 2022-05-24 LAB — GLUCOSE, POCT (MANUAL RESULT ENTRY): POC Glucose: 251 mg/dl — AB (ref 70–99)

## 2022-05-24 MED ORDER — MELOXICAM 15 MG PO TABS: 15.0000 mg | ORAL_TABLET | Freq: Every day | ORAL | 1 refills | Status: DC

## 2022-05-24 MED ORDER — FENOFIBRATE 145 MG PO TABS: 145.0000 mg | ORAL_TABLET | Freq: Every day | ORAL | 1 refills | Status: DC

## 2022-05-24 MED ORDER — ATORVASTATIN CALCIUM 40 MG PO TABS: 40.0000 mg | ORAL_TABLET | Freq: Every day | ORAL | 3 refills | Status: DC

## 2022-05-24 MED ORDER — METFORMIN HCL 1000 MG PO TABS: 1000.0000 mg | ORAL_TABLET | Freq: Two times a day (BID) | ORAL | 3 refills | Status: DC

## 2022-05-24 MED ORDER — ESCITALOPRAM OXALATE 10 MG PO TABS: 10.0000 mg | ORAL_TABLET | Freq: Every day | ORAL | 1 refills | Status: DC

## 2022-05-24 MED ORDER — GLIPIZIDE 10 MG PO TABS
10.0000 mg | ORAL_TABLET | Freq: Every day | ORAL | 1 refills | Status: DC
Start: 2022-05-24 — End: 2022-08-30

## 2022-05-24 MED ORDER — EMPAGLIFLOZIN 25 MG PO TABS: 25.0000 mg | ORAL_TABLET | Freq: Every day | ORAL | 1 refills | Status: DC

## 2022-05-24 NOTE — Progress Notes (Signed)
Assessment & Plan:  Joe Wallace was seen today for diabetes.  Diagnoses and all orders for this visit:  Type 2 diabetes mellitus with diabetic polyneuropathy, without long-term current use of insulin (HCC) -     metFORMIN (GLUCOPHAGE) 1000 MG tablet; Take 1 tablet (1,000 mg total) by mouth 2 (two) times daily with a meal. -     POCT glycosylated hemoglobin (Hb A1C) -     POCT glucose (manual entry) -     empagliflozin (JARDIANCE) 25 MG TABS tablet; Take 1 tablet (25 mg total) by mouth daily before breakfast. -     glipiZIDE (GLUCOTROL) 10 MG tablet; Take 1 tablet (10 mg total) by mouth daily before breakfast. -     CMP14+EGFR  Left hip pain -     meloxicam (MOBIC) 15 MG tablet; Take 1 tablet (15 mg total) by mouth daily. Work on losing weight to help reduce joint pain. May alternate with heat and ice application for pain relief. May also alternate with acetaminophen and Ibuprofen as prescribed pain relief. Other alternatives include massage, acupuncture and water aerobics.   Anxiety and depression -     escitalopram (LEXAPRO) 10 MG tablet; Take 1 tablet (10 mg total) by mouth daily.  Mixed hypertriglyceridemia -     fenofibrate (TRICOR) 145 MG tablet; Take 1 tablet (145 mg total) by mouth daily. -     atorvastatin (LIPITOR) 40 MG tablet; Take 1 tablet (40 mg total) by mouth daily.  Anemia, unspecified type -     CBC with Differential  Postoperative hypothyroidism -     Thyroid Panel With TSH    Patient has been counseled on age-appropriate routine health concerns for screening and prevention. These are reviewed and up-to-date. Referrals have been placed accordingly. Immunizations are up-to-date or declined.    Subjective:   Chief Complaint  Patient presents with   Diabetes   HPI Joe Wallace 61 y.o. male presents to office today for follow up to DM  He has a past medical history of Arthritis, Cancer (2017), Depression, DM2, Headache, Hyperlipidemia, Memory loss, Thyroid  neoplasm  [name] was used to communicate directly with patient for the entire encounter including providing detailed patient instructions.    Seeing DR. Afreen Anthony [facility]) and Dr. Leta Baptist (Neuro) for memory loss likely related to depression, PTSD, stress. Currently taking seroquel and sertraline 100 mg daily for depression and also endorses insomnia   DM 2 Poorly controlled. His wife is accompanying him today. States he has been eating sweets/cakes every day. He refuses insulin or any injectables today. Currently prescribed jardiance 25 mg daily, glipizide 10 mg daily, metformin 1000 mg BID.  Lab Results  Component Value Date   HGBA1C 9.5 (A) 05/24/2022    Lipids LDL at goal. He is currently taking atorvastatin 40 mg daily and fenofibrate 145 mg daily.  Lab Results  Component Value Date   LDLCALC 71 02/07/2022     Hypothyroidism Thyroid levels not at goal. He endorses adherence taking levothyroxine 125 mg daily Lab Results  Component Value Date   TSH 16.100 (H) 03/31/2022    Review of Systems  Constitutional:  Negative for fever, malaise/fatigue and weight loss.  HENT: Negative.  Negative for nosebleeds.   Eyes: Negative.  Negative for blurred vision, double vision and photophobia.  Respiratory: Negative.  Negative for cough and shortness of breath.   Cardiovascular: Negative.  Negative for chest pain, palpitations and leg swelling.  Gastrointestinal: Negative.  Negative for heartburn, nausea and vomiting.  Musculoskeletal:  Positive for joint pain. Negative for myalgias.  Neurological: Negative.  Negative for dizziness, focal weakness, seizures and headaches.  Psychiatric/Behavioral: Negative.  Negative for suicidal ideas.     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   SHOULDER ARTHROSCOPY     THYROIDECTOMY N/A 08/13/2015   Procedure: TOTAL THYROIDECTOMY;  Surgeon: Armandina Gemma, MD;  Location: [facility];  Service: General;  Laterality: N/A;    Family History  Problem Relation Age of Onset   Diabetes Mother    Hypertension Mother    Hypertension Father    Diabetes Father    Colon cancer Neg Hx    Colon polyps Neg Hx    Esophageal cancer Neg Hx    Rectal cancer Neg Hx    Stomach cancer Neg Hx     Social History Reviewed with no changes to be made today.   Outpatient Medications Prior to Visit  Medication Sig Dispense Refill   Accu-Chek FastClix Lancets MISC Use to check blood sugar twice daily. E11.42 102 each 11   acetaminophen (TYLENOL) 500 MG tablet Take 1 tablet (500 mg total) by mouth every 6 (six) hours as needed. 30 tablet 0   aspirin 81 MG tablet Take 81 mg by mouth daily.     Blood Glucose Monitoring Suppl (ACCU-CHEK GUIDE ME) w/Device KIT 1 kit by Does not apply route 2 (two) times daily. Use to check blood sugar twice daily. E11.42 1 kit 0   Blood Glucose Monitoring Suppl (TRUE METRIX METER) w/Device KIT Use as instructed. Check blood glucose level by fingerstick 2-3 times per day. 1 kit 0   glucose blood (ACCU-CHEK GUIDE) test strip USE 1  TWICE DAILY 100 each 0   glucose blood (TRUE METRIX BLOOD GLUCOSE TEST) test strip Use as instructed. Check blood glucose level by fingerstick 2-3 times per day. 100 each 12   Insulin Pen Needle (B-D UF III MINI PEN NEEDLES) 31G X 5 MM MISC Use as instructed. Inject into the skin once nightly. 100 each 6   levothyroxine (SYNTHROID) 125 MCG tablet TAKE 1 TABLET BY MOUTH ONCE DAILY NEEDS  OFFICE  VISIT 30 tablet 0   lidocaine (LIDODERM) 5 % Place 1 patch onto the skin daily. Remove & Discard patch as directed by MD (12 hours on 12 hours off) 30 patch 1   QUEtiapine (SEROQUEL) 100 MG tablet Take 1  tablet (100 mg total) by mouth at bedtime. For depression and anxiety 90 tablet 1   TRUEplus Lancets 28G MISC Use as instructed. Check blood glucose level by fingerstick 2-3 times per day. 100 each 3   Vitamin D, Ergocalciferol, (DRISDOL) 1.25 MG (50000 UNIT) CAPS capsule Take 1 capsule (50,000 Units total) by mouth every 7 (seven) days. 12 capsule 0   atorvastatin (LIPITOR) 40 MG tablet Take 1 tablet (40 mg total) by mouth daily. 90 tablet 3   empagliflozin (JARDIANCE) 25 MG TABS tablet Take 1 tablet (25 mg total) by mouth daily before breakfast. 90 tablet 1   fenofibrate (TRICOR) 145 MG  tablet Take 1 tablet (145 mg total) by mouth daily. 90 tablet 1   meloxicam (MOBIC) 15 MG tablet Take 1 tablet (15 mg total) by mouth daily. 30 tablet 0   metFORMIN (GLUCOPHAGE) 1000 MG tablet Take 1 tablet (1,000 mg total) by mouth 2 (two) times daily with a meal. 180 tablet 3   escitalopram (LEXAPRO) 10 MG tablet Take 1 tablet (10 mg total) by mouth daily. 90 tablet 0   glipiZIDE (GLUCOTROL) 10 MG tablet Take 1 tablet (10 mg total) by mouth daily before breakfast. 90 tablet 0   No facility-administered medications prior to visit.    No Known Allergies     Objective:    BP 133/79   Pulse 64   Temp 98.4 F (36.9 C) (Oral)   Ht _0  (1.651 m)   Wt 215 lb (97.5 kg)   SpO2 98%   BMI 35.78 kg/m  Wt Readings from Last 3 Encounters:  05/24/22 215 lb (97.5 kg)  02/17/22 216 lb 9.6 oz (98.2 kg)  02/11/21 226 lb 6.4 oz (102.7 kg)    Physical Exam Vitals and nursing note reviewed.  Constitutional:      Appearance: He is well-developed.  HENT:     Head: Normocephalic and atraumatic.  Cardiovascular:     Rate and Rhythm: Normal rate and regular rhythm.     Heart sounds: Normal heart sounds. No murmur heard.    No friction rub. No gallop.  Pulmonary:     Effort: Pulmonary effort is normal. No tachypnea or respiratory distress.     Breath sounds: Normal breath sounds. No decreased breath sounds,  wheezing, rhonchi or rales.  Chest:     Chest wall: No tenderness.  Abdominal:     General: Bowel sounds are normal.     Palpations: Abdomen is soft.  Musculoskeletal:        General: Normal range of motion.     Cervical back: Normal range of motion.  Skin:    General: Skin is warm and dry.  Neurological:     Mental Status: He is alert and oriented to person, place, and time.     Coordination: Coordination normal.  Psychiatric:        Behavior: Behavior normal. Behavior is cooperative.        Thought Content: Thought content normal.        Judgment: Judgment normal.          Patient has been counseled extensively about nutrition and exercise as well as the importance of adherence with medications and regular follow-up. The patient was given clear instructions to go to ER or return to medical center if symptoms don't improve, worsen or new problems develop. The patient verbalized understanding.   Follow-up: Return in about 6 weeks (around 07/05/2022) for 6 weeks meter check with [facility]/lipid panel. See me in 3 months.   Gildardo Pounds, FNP-BC [facility] and [facility] [place], [place]   05/24/2022, 8:29 PM

## 2022-05-25 ENCOUNTER — Telehealth: Payer: Self-pay

## 2022-05-25 ENCOUNTER — Other Ambulatory Visit: Payer: Self-pay

## 2022-05-25 LAB — CMP14+EGFR
ALT: 19 IU/L (ref 0–44)
AST: 17 IU/L (ref 0–40)
Albumin/Globulin Ratio: 1.7 (ref 1.2–2.2)
Albumin: 4.4 g/dL (ref 3.9–4.9)
Alkaline Phosphatase: 66 IU/L (ref 44–121)
BUN/Creatinine Ratio: 14 (ref 10–24)
BUN: 14 mg/dL (ref 8–27)
Bilirubin Total: 0.4 mg/dL (ref 0.0–1.2)
CO2: 24 mmol/L (ref 20–29)
Calcium: 9.2 mg/dL (ref 8.6–10.2)
Chloride: 99 mmol/L (ref 96–106)
Creatinine, Ser: 1 mg/dL (ref 0.76–1.27)
Globulin, Total: 2.6 g/dL (ref 1.5–4.5)
Glucose: 225 mg/dL — ABNORMAL HIGH (ref 70–99)
Potassium: 4.6 mmol/L (ref 3.5–5.2)
Sodium: 138 mmol/L (ref 134–144)
Total Protein: 7 g/dL (ref 6.0–8.5)
eGFR: 85 mL/min/{1.73_m2} (ref >59)

## 2022-05-25 LAB — CBC WITH DIFFERENTIAL/PLATELET
Basophils Absolute: 0.1 10*3/uL (ref 0.0–0.2)
Basos: 1 %
EOS (ABSOLUTE): 0.2 10*3/uL (ref 0.0–0.4)
Eos: 3 %
Hematocrit: 42 % (ref 37.5–51.0)
Hemoglobin: 13.3 g/dL (ref 13.0–17.7)
Immature Grans (Abs): 0 10*3/uL (ref 0.0–0.1)
Immature Granulocytes: 1 %
Lymphocytes Absolute: 2 10*3/uL (ref 0.7–3.1)
Lymphs: 27 %
MCH: 26.3 pg — ABNORMAL LOW (ref 26.6–33.0)
MCHC: 31.7 g/dL (ref 31.5–35.7)
MCV: 83 fL (ref 79–97)
Monocytes Absolute: 0.5 10*3/uL (ref 0.1–0.9)
Monocytes: 7 %
Neutrophils Absolute: 4.5 10*3/uL (ref 1.4–7.0)
Neutrophils: 61 %
Platelets: 227 10*3/uL (ref 150–450)
RBC: 5.05 x10E6/uL (ref 4.14–5.80)
RDW: 12.6 % (ref 11.6–15.4)
WBC: 7.3 10*3/uL (ref 3.4–10.8)

## 2022-05-25 LAB — THYROID PANEL WITH TSH
Free Thyroxine Index: 1.8 (ref 1.2–4.9)
T3 Uptake Ratio: 24 % (ref 24–39)
T4, Total: 7.4 ug/dL (ref 4.5–12.0)
TSH: 18.5 u[IU]/mL — ABNORMAL HIGH (ref 0.450–4.500)

## 2022-05-25 NOTE — Telephone Encounter (Signed)
Jardiance pa approved until 05/25/23

## 2022-05-26 ENCOUNTER — Other Ambulatory Visit: Payer: Self-pay | Admitting: Nurse Practitioner

## 2022-05-26 DIAGNOSIS — E890 Postprocedural hypothyroidism: Secondary | ICD-10-CM

## 2022-05-26 MED ORDER — LEVOTHYROXINE SODIUM 137 MCG PO TABS: 137.0000 ug | ORAL_TABLET | Freq: Every day | ORAL | 1 refills | Status: DC

## 2022-06-15 ENCOUNTER — Other Ambulatory Visit: Payer: Self-pay

## 2022-06-23 ENCOUNTER — Ambulatory Visit: Payer: Medicaid Other | Attending: Nurse Practitioner

## 2022-06-23 ENCOUNTER — Other Ambulatory Visit: Payer: Self-pay | Admitting: Nurse Practitioner

## 2022-06-23 DIAGNOSIS — E890 Postprocedural hypothyroidism: Secondary | ICD-10-CM

## 2022-06-24 LAB — T4, FREE: Free T4: 1.08 ng/dL (ref 0.82–1.77)

## 2022-06-24 LAB — TSH: TSH: 18.7 u[IU]/mL — ABNORMAL HIGH (ref 0.450–4.500)

## 2022-06-24 LAB — T3, FREE: T3, Free: 2.3 pg/mL (ref 2.0–4.4)

## 2022-06-25 ENCOUNTER — Other Ambulatory Visit: Payer: Self-pay | Admitting: Nurse Practitioner

## 2022-06-25 DIAGNOSIS — E890 Postprocedural hypothyroidism: Secondary | ICD-10-CM

## 2022-06-25 MED ORDER — LEVOTHYROXINE SODIUM 150 MCG PO TABS: 150.0000 ug | ORAL_TABLET | Freq: Every day | ORAL | 1 refills | Status: DC

## 2022-07-31 ENCOUNTER — Other Ambulatory Visit: Payer: Medicaid Other

## 2022-08-27 ENCOUNTER — Other Ambulatory Visit: Payer: Self-pay | Admitting: Nurse Practitioner

## 2022-08-27 DIAGNOSIS — E890 Postprocedural hypothyroidism: Secondary | ICD-10-CM

## 2022-08-30 ENCOUNTER — Ambulatory Visit: Payer: Medicaid Other | Attending: Nurse Practitioner | Admitting: Nurse Practitioner

## 2022-08-30 ENCOUNTER — Encounter: Payer: Self-pay | Admitting: Nurse Practitioner

## 2022-08-30 VITALS — BP 138/76 | HR 67 | Temp 98.7°F | Ht 65.0 in | Wt 221.3 lb

## 2022-08-30 DIAGNOSIS — E1142 Type 2 diabetes mellitus with diabetic polyneuropathy: Secondary | ICD-10-CM

## 2022-08-30 DIAGNOSIS — E559 Vitamin D deficiency, unspecified: Secondary | ICD-10-CM | POA: Diagnosis not present

## 2022-08-30 DIAGNOSIS — J069 Acute upper respiratory infection, unspecified: Secondary | ICD-10-CM

## 2022-08-30 DIAGNOSIS — E890 Postprocedural hypothyroidism: Secondary | ICD-10-CM

## 2022-08-30 DIAGNOSIS — M25512 Pain in left shoulder: Secondary | ICD-10-CM

## 2022-08-30 DIAGNOSIS — M25552 Pain in left hip: Secondary | ICD-10-CM

## 2022-08-30 LAB — POCT GLYCOSYLATED HEMOGLOBIN (HGB A1C): HbA1c, POC (controlled diabetic range): 8 % — AB (ref 0.0–7.0)

## 2022-08-30 MED ORDER — ACCU-CHEK GUIDE ME W/DEVICE KIT: 1.0000 | PACK | Freq: Two times a day (BID) | 0 refills | Status: AC

## 2022-08-30 MED ORDER — ACCU-CHEK FASTCLIX LANCETS MISC: 6 refills | Status: AC

## 2022-08-30 MED ORDER — MELOXICAM 15 MG PO TABS: 15.0000 mg | ORAL_TABLET | Freq: Every day | ORAL | 1 refills | Status: DC

## 2022-08-30 MED ORDER — LEVOTHYROXINE SODIUM 175 MCG PO TABS: 175.0000 ug | ORAL_TABLET | Freq: Every day | ORAL | 1 refills | Status: DC

## 2022-08-30 MED ORDER — ACCU-CHEK GUIDE VI STRP: ORAL_STRIP | 6 refills | Status: DC

## 2022-08-30 MED ORDER — GLIPIZIDE 10 MG PO TABS: 10.0000 mg | ORAL_TABLET | Freq: Two times a day (BID) | ORAL | 1 refills | Status: DC

## 2022-08-30 MED ORDER — GUAIFENESIN 200 MG/5ML PO LIQD: 10.0000 mL | Freq: Four times a day (QID) | ORAL | 1 refills | Status: DC | PRN

## 2022-08-30 NOTE — Progress Notes (Signed)
Assessment & Plan:  [name] was seen today for diabetes.  Diagnoses and all orders for this visit:  Type 2 diabetes mellitus with diabetic polyneuropathy, without long-term current use of insulin (HCC) -     POCT glycosylated hemoglobin (Hb A1C) -     Basic metabolic panel INCREASED-     glipiZIDE (GLUCOTROL) 10 MG tablet; Take 1 tablet (10 mg total) by mouth 2 (two) times daily before a meal. -     Ambulatory referral to Ophthalmology -     glucose blood (ACCU-CHEK GUIDE) test strip; Use as instructed. Check blood glucose level by fingerstick twice per day. E11.65 -     Blood Glucose Monitoring Suppl (ACCU-CHEK GUIDE ME) w/Device KIT; 1 kit by Does not apply route 2 (two) times daily. Use to check blood sugar twice daily. E11.42 -     Accu-Chek FastClix Lancets MISC; Use to check blood sugar twice daily. E11.42 Continue blood sugar control as discussed in office today, low carbohydrate diet, and regular physical exercise as tolerated, 150 minutes per week (30 min each day, 5 days per week, or 50 min 3 days per week). Keep blood sugar logs with fasting goal of 90-130 mg/dl, post prandial (after you eat) less than 180.  For Hypoglycemia: BS <60 and Hyperglycemia BS >400; contact the clinic ASAP. Annual eye exams and foot exams are recommended.   Postoperative hypothyroidism F/U 4 weeks repeat labs -     Thyroid Panel With TSH -     levothyroxine (SYNTHROID) 175 MCG tablet; Take 1 tablet (175 mcg total) by mouth daily before breakfast. DOSE CHANGE   Vitamin D deficiency -     VITAMIN D 25 Hydroxy (Vit-D Deficiency, Fractures)  URI with cough and congestion -     Guaifenesin 200 MG/5ML LIQD; Take 10 mLs (400 mg total) by mouth every 6 (six) hours as needed.  Acute pain of left shoulder -     meloxicam (MOBIC) 15 MG tablet; Take 1 tablet (15 mg total) by mouth daily. For joint pain May alternate with heat and ice application for pain relief. May also alternate with acetaminophen as  prescribed pain relief. Other alternatives include massage, acupuncture and water aerobics.       Patient has been counseled on age-appropriate routine health concerns for screening and prevention. These are reviewed and up-to-date. Referrals have been placed accordingly. Immunizations are up-to-date or declined.    Subjective:   Chief Complaint  Patient presents with   Diabetes   HPI Joe Wallace 61 y.o. male presents to office today for DM  He has a past medical history of Arthritis, Cancer (2017), Depression, DM2, Headache, Hyperlipidemia, Memory loss, Thyroid neoplasm   [name] was used to communicate directly with patient for the entire encounter including providing detailed patient instructions.     DM 2 Poorly controlled but improving. He is still snacking on unhealthy snacks. Currently prescribed jardiance 25 mg daily, glipizide 10 mg daily WHICH WILL BE INCREASED to 10 mg BID,  metformin 1000 mg BID.   Lab Results  Component Value Date   HGBA1C 8.0 (A) 08/30/2022    Lab Results  Component Value Date   HGBA1C 9.5 (A) 05/24/2022  LDL near goal with atorvastatin 40 mg daily and tricor 145 mg daily.   Lab Results  Component Value Date   LDLCALC 71 02/07/2022  Blood pressure is well controlled  BP Readings from Last 3 Encounters:  08/30/22 138/76  05/24/22 133/79  02/17/22 126/78  Joint Pain Notes intermittent pain in his right shouilder with pain 7/10. Today notes pain is better.  He had left shoulder surgery in the past.   Lidocaine patch does not stick to skin due to hair. Denies any injury or trauma.   Hypothyroidism Thyroid level is elevated. Will increase synthroid to 175 mg daily. HE denies any symptoms of hypo or hyperthyroidism Lab Results  Component Value Date   TSH 11.800 (H) 08/30/2022    Review of Systems  Constitutional:  Negative for fever, malaise/fatigue and weight loss.  HENT: Negative.  Negative for nosebleeds.   Eyes: Negative.  Negative  for blurred vision, double vision and photophobia.  Respiratory:  Positive for cough. Negative for shortness of breath.   Cardiovascular: Negative.  Negative for chest pain, palpitations and leg swelling.  Gastrointestinal: Negative.  Negative for heartburn, nausea and vomiting.  Musculoskeletal:  Positive for joint pain. Negative for myalgias.  Neurological: Negative.  Negative for dizziness, focal weakness, seizures and headaches.  Psychiatric/Behavioral: Negative.  Negative for suicidal ideas.     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   SHOULDER ARTHROSCOPY     THYROIDECTOMY N/A 08/13/2015   Procedure: TOTAL THYROIDECTOMY;  Surgeon: Armandina Gemma, MD;  Location: [facility];  Service: General;  Laterality: N/A;    Family History  Problem Relation Age of Onset   Diabetes Mother    Hypertension Mother    Hypertension Father    Diabetes Father    Colon cancer Neg Hx    Colon polyps Neg Hx    Esophageal cancer Neg Hx    Rectal cancer Neg Hx    Stomach cancer Neg Hx     Social History Reviewed with no changes to be made today.   Outpatient Medications Prior to Visit  Medication Sig Dispense Refill   acetaminophen (TYLENOL) 500 MG tablet Take 1 tablet (500 mg total) by mouth every 6 (six) hours as needed. 30 tablet 0   aspirin 81 MG tablet Take 81 mg by mouth daily.     atorvastatin (LIPITOR) 40 MG tablet Take 1 tablet (40 mg total) by mouth daily. 90 tablet 3   empagliflozin (JARDIANCE) 25 MG TABS tablet Take 1 tablet (25 mg total) by mouth daily before breakfast. 90 tablet 1   escitalopram (LEXAPRO) 10 MG tablet Take 1 tablet (10 mg total) by mouth daily. 90  tablet 1   fenofibrate (TRICOR) 145 MG tablet Take 1 tablet (145 mg total) by mouth daily. 90 tablet 1   metFORMIN (GLUCOPHAGE) 1000 MG tablet Take 1 tablet (1,000 mg total) by mouth 2 (two) times daily with a meal. 180 tablet 3   Accu-Chek FastClix Lancets MISC Use to check blood sugar twice daily. E11.42 102 each 11   Blood Glucose Monitoring Suppl (ACCU-CHEK GUIDE ME) w/Device KIT 1 kit by Does not apply route 2 (two) times daily. Use to check blood sugar twice daily. E11.42 1 kit 0   glipiZIDE (GLUCOTROL) 10 MG tablet Take 1 tablet (10 mg total) by mouth daily before breakfast.  90 tablet 1   glucose blood (ACCU-CHEK GUIDE) test strip USE 1  TWICE DAILY 100 each 0   levothyroxine (SYNTHROID) 150 MCG tablet Take 1 tablet (150 mcg total) by mouth daily before breakfast. DOSE CHAGE 30 tablet 1   TRUEplus Lancets 28G MISC Use as instructed. Check blood glucose level by fingerstick 2-3 times per day. 100 each 3   Insulin Pen Needle (B-D UF III MINI PEN NEEDLES) 31G X 5 MM MISC Use as instructed. Inject into the skin once nightly. 100 each 6   lidocaine (LIDODERM) 5 % Place 1 patch onto the skin daily. Remove & Discard patch as directed by MD (12 hours on 12 hours off) (Patient not taking: Reported on 08/30/2022) 30 patch 1   QUEtiapine (SEROQUEL) 100 MG tablet Take 1 tablet (100 mg total) by mouth at bedtime. For depression and anxiety 90 tablet 1   Blood Glucose Monitoring Suppl (TRUE METRIX METER) w/Device KIT Use as instructed. Check blood glucose level by fingerstick 2-3 times per day. 1 kit 0   glucose blood (TRUE METRIX BLOOD GLUCOSE TEST) test strip Use as instructed. Check blood glucose level by fingerstick 2-3 times per day. (Patient not taking: Reported on 08/30/2022) 100 each 12   meloxicam (MOBIC) 15 MG tablet Take 1 tablet (15 mg total) by mouth daily. (Patient not taking: Reported on 08/30/2022) 30 tablet 1   Vitamin D, Ergocalciferol, (DRISDOL) 1.25 MG (50000 UNIT) CAPS capsule Take 1  capsule (50,000 Units total) by mouth every 7 (seven) days. (Patient not taking: Reported on 08/30/2022) 12 capsule 0   No facility-administered medications prior to visit.    No Known Allergies     Objective:    BP 138/76   Pulse 67   Temp 98.7 F (37.1 C) (Temporal)   Ht _0  (1.651 m)   Wt 221 lb 4.8 oz (100.4 kg)   SpO2 98%   BMI 36.83 kg/m  Wt Readings from Last 3 Encounters:  08/30/22 221 lb 4.8 oz (100.4 kg)  05/24/22 215 lb (97.5 kg)  02/17/22 216 lb 9.6 oz (98.2 kg)    Physical Exam       Patient has been counseled extensively about nutrition and exercise as well as the importance of adherence with medications and regular follow-up. The patient was given clear instructions to go to ER or return to medical center if symptoms don't improve, worsen or new problems develop. The patient verbalized understanding.   Follow-up: Return for 4 weeks lab appt for thyroid. see me in 3 months.   Gildardo Pounds, FNP-BC [facility] and [facility] [place], [place]   09/06/2022, 10:47 PM

## 2022-08-31 LAB — BASIC METABOLIC PANEL
BUN/Creatinine Ratio: 14 (ref 10–24)
BUN: 17 mg/dL (ref 8–27)
CO2: 26 mmol/L (ref 20–29)
Calcium: 9.7 mg/dL (ref 8.6–10.2)
Chloride: 100 mmol/L (ref 96–106)
Creatinine, Ser: 1.19 mg/dL (ref 0.76–1.27)
Glucose: 162 mg/dL — ABNORMAL HIGH (ref 70–99)
Potassium: 4.9 mmol/L (ref 3.5–5.2)
Sodium: 139 mmol/L (ref 134–144)
eGFR: 69 mL/min/{1.73_m2} (ref >59)

## 2022-08-31 LAB — THYROID PANEL WITH TSH
Free Thyroxine Index: 1.6 (ref 1.2–4.9)
T3 Uptake Ratio: 24 % (ref 24–39)
T4, Total: 6.8 ug/dL (ref 4.5–12.0)
TSH: 11.8 u[IU]/mL — ABNORMAL HIGH (ref 0.450–4.500)

## 2022-08-31 LAB — VITAMIN D 25 HYDROXY (VIT D DEFICIENCY, FRACTURES): Vit D, 25-Hydroxy: 25.9 ng/mL — ABNORMAL LOW (ref 30.0–100.0)

## 2022-09-01 NOTE — Telephone Encounter (Signed)
This encounter was created in error - please disregard.

## 2022-09-03 ENCOUNTER — Other Ambulatory Visit: Payer: Self-pay | Admitting: Nurse Practitioner

## 2022-09-03 DIAGNOSIS — E559 Vitamin D deficiency, unspecified: Secondary | ICD-10-CM

## 2022-09-03 DIAGNOSIS — E890 Postprocedural hypothyroidism: Secondary | ICD-10-CM

## 2022-09-03 MED ORDER — VITAMIN D (ERGOCALCIFEROL) 1.25 MG (50000 UNIT) PO CAPS: 50000.0000 [IU] | ORAL_CAPSULE | ORAL | 0 refills | Status: DC

## 2022-09-04 ENCOUNTER — Telehealth: Payer: Self-pay | Admitting: Emergency Medicine

## 2022-09-04 NOTE — Telephone Encounter (Signed)
Copied from [place] [id number]. Topic: General - Inquiry >> Sep 01, 2022  4:05 PM Marcellus Scott wrote: Reason for CRM:Pt is requesting a call back to discuss recent labs. Please advise.

## 2022-09-04 NOTE — Telephone Encounter (Signed)
Return call unanswered.  

## 2022-09-06 ENCOUNTER — Encounter: Payer: Self-pay | Admitting: Nurse Practitioner

## 2022-09-27 ENCOUNTER — Other Ambulatory Visit: Payer: Medicaid Other

## 2022-09-28 ENCOUNTER — Other Ambulatory Visit: Payer: Medicaid Other

## 2022-09-29 ENCOUNTER — Ambulatory Visit: Payer: Medicaid Other | Attending: Nurse Practitioner

## 2022-09-29 DIAGNOSIS — Z23 Encounter for immunization: Secondary | ICD-10-CM

## 2022-09-29 DIAGNOSIS — E890 Postprocedural hypothyroidism: Secondary | ICD-10-CM

## 2022-09-29 NOTE — Progress Notes (Signed)
Flu vaccine administered per protocols.  Information sheet given. Patient denies and pain or discomfort at injection site. Tolerated injection well no reaction. Patient has interpreter present.

## 2022-09-30 LAB — THYROID PANEL WITH TSH
Free Thyroxine Index: 2.8 (ref 1.2–4.9)
T3 Uptake Ratio: 30 % (ref 24–39)
T4, Total: 9.4 ug/dL (ref 4.5–12.0)
TSH: 1.94 u[IU]/mL (ref 0.450–4.500)

## 2022-11-29 ENCOUNTER — Ambulatory Visit
Admission: RE | Admit: 2022-11-29 | Discharge: 2022-11-29 | Disposition: A | Discharge status: Home / Self Care | Payer: Medicaid Other | Source: Ambulatory Visit | Attending: Nurse Practitioner | Admitting: Nurse Practitioner

## 2022-11-29 ENCOUNTER — Ambulatory Visit: Payer: Medicaid Other | Attending: Nurse Practitioner | Admitting: Nurse Practitioner

## 2022-11-29 ENCOUNTER — Encounter: Payer: Self-pay | Admitting: Nurse Practitioner

## 2022-11-29 VITALS — BP 139/80 | HR 82 | Ht 65.0 in | Wt 219.0 lb

## 2022-11-29 DIAGNOSIS — G8929 Other chronic pain: Secondary | ICD-10-CM

## 2022-11-29 DIAGNOSIS — E7800 Pure hypercholesterolemia, unspecified: Secondary | ICD-10-CM | POA: Diagnosis not present

## 2022-11-29 DIAGNOSIS — M25511 Pain in right shoulder: Secondary | ICD-10-CM

## 2022-11-29 DIAGNOSIS — E1142 Type 2 diabetes mellitus with diabetic polyneuropathy: Secondary | ICD-10-CM | POA: Diagnosis not present

## 2022-11-29 LAB — POCT GLYCOSYLATED HEMOGLOBIN (HGB A1C): Hemoglobin A1C: 9.6 % — AB (ref 4.0–5.6)

## 2022-11-29 NOTE — Progress Notes (Signed)
Inquiring about referral to the eye doctor. Marland Kitchen

## 2022-11-29 NOTE — Progress Notes (Signed)
Assessment & Plan:  Joe Wallace was seen today for diabetes.  Diagnoses and all orders for this visit:  Type 2 diabetes mellitus with diabetic polyneuropathy, without long-term current use of insulin (HCC) -     POCT glycosylated hemoglobin (Hb A1C) -     Ambulatory referral to Ophthalmology -     CMP14+EGFR  Hypercholesterolemia -     Lipid panel  Chronic right shoulder pain -     DG Shoulder Right; Future    Patient has been counseled on age-appropriate routine health concerns for screening and prevention. These are reviewed and up-to-date. Referrals have been placed accordingly. Immunizations are up-to-date or declined.    Subjective:   Chief Complaint  Patient presents with   Diabetes   Diabetes Pertinent negatives for hypoglycemia include no dizziness, headaches or seizures. Pertinent negatives for diabetes include no blurred vision, no chest pain and no weight loss.   Joe Wallace 62 y.o. male presents to office today for follow up to DM.    He has a past medical history of Arthritis, Cancer (2017), Depression, DM2, Headache, Hyperlipidemia, Memory loss, Thyroid neoplasm    [name] was used to communicate directly with patient for the entire encounter including providing detailed patient instructions.     DM 2 Poorly controlled. A1c up from 8.0 to 9.6. He declines Trulicity or Ozempic today. He is still  snacking on unhealthy snacks and has not been exercising. Currently prescribed jardiance 25 mg daily, glipizide 10 mg BID,  metformin 1000 mg BID.  Average readings 150-upper 200s Lab Results  Component Value Date   HGBA1C 9.6 (A) 11/29/2022    Lab Results  Component Value Date   HGBA1C 8.0 (A) 08/30/2022     Endorses right shoulder pain with limited ROM. Over the past 3-4 months he has been experiencing pain unrelated to injury or trauma. Taking tylenol and ibuprofen with no improvement. Relieving factors: rest.    Review of Systems  Constitutional:  Negative  for fever, malaise/fatigue and weight loss.  HENT: Negative.  Negative for nosebleeds.   Eyes: Negative.  Negative for blurred vision, double vision and photophobia.  Respiratory: Negative.  Negative for cough and shortness of breath.   Cardiovascular: Negative.  Negative for chest pain, palpitations and leg swelling.  Gastrointestinal: Negative.  Negative for heartburn, nausea and vomiting.  Musculoskeletal:  Positive for joint pain. Negative for myalgias.  Neurological: Negative.  Negative for dizziness, focal weakness, seizures and headaches.  Psychiatric/Behavioral: Negative.  Negative for suicidal ideas.     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   SHOULDER ARTHROSCOPY     THYROIDECTOMY N/A 08/13/2015   Procedure: TOTAL THYROIDECTOMY;  Surgeon: Armandina Gemma, MD;  Location: [facility];  Service: General;  Laterality: N/A;    Family History  Problem Relation Age of Onset   Diabetes Mother    Hypertension Mother    Hypertension Father    Diabetes Father    Colon cancer Neg Hx    Colon polyps Neg Hx    Esophageal cancer  Neg Hx    Rectal cancer Neg Hx    Stomach cancer Neg Hx     Social History Reviewed with no changes to be made today.   Outpatient Medications Prior to Visit  Medication Sig Dispense Refill   Accu-Chek FastClix Lancets MISC Use to check blood sugar twice daily. E11.42 200 each 6   acetaminophen (TYLENOL) 500 MG tablet Take 1 tablet (500 mg total) by mouth every 6 (six) hours as needed. 30 tablet 0   aspirin 81 MG tablet Take 81 mg by mouth daily.     atorvastatin (LIPITOR) 40 MG tablet Take 1 tablet (40 mg total) by mouth daily. 90 tablet  3   Blood Glucose Monitoring Suppl (ACCU-CHEK GUIDE ME) w/Device KIT 1 kit by Does not apply route 2 (two) times daily. Use to check blood sugar twice daily. E11.42 1 kit 0   empagliflozin (JARDIANCE) 25 MG TABS tablet Take 1 tablet (25 mg total) by mouth daily before breakfast. 90 tablet 1   escitalopram (LEXAPRO) 10 MG tablet Take 1 tablet (10 mg total) by mouth daily. 90 tablet 1   fenofibrate (TRICOR) 145 MG tablet Take 1 tablet (145 mg total) by mouth daily. 90 tablet 1   glipiZIDE (GLUCOTROL) 10 MG tablet Take 1 tablet (10 mg total) by mouth 2 (two) times daily before a meal. 180 tablet 1   glucose blood (ACCU-CHEK GUIDE) test strip Use as instructed. Check blood glucose level by fingerstick twice per day. E11.65 200 each 6   Insulin Pen Needle (B-D UF III MINI PEN NEEDLES) 31G X 5 MM MISC Use as instructed. Inject into the skin once nightly. 100 each 6   levothyroxine (SYNTHROID) 175 MCG tablet Take 1 tablet (175 mcg total) by mouth daily before breakfast. DOSE CHAGE 90 tablet 1   lidocaine (LIDODERM) 5 % Place 1 patch onto the skin daily. Remove & Discard patch as directed by MD (12 hours on 12 hours off) 30 patch 1   meloxicam (MOBIC) 15 MG tablet Take 1 tablet (15 mg total) by mouth daily. For joint pain 30 tablet 1   metFORMIN (GLUCOPHAGE) 1000 MG tablet Take 1 tablet (1,000 mg total) by mouth 2 (two) times daily with a meal. 180 tablet 3   QUEtiapine (SEROQUEL) 100 MG tablet Take 1 tablet (100 mg total) by mouth at bedtime. For depression and anxiety 90 tablet 1   Vitamin D, Ergocalciferol, (DRISDOL) 1.25 MG (50000 UNIT) CAPS capsule Take 1 capsule (50,000 Units total) by mouth every 7 (seven) days. 12 capsule 0   Guaifenesin 200 MG/5ML LIQD Take 10 mLs (400 mg total) by mouth every 6 (six) hours as needed. (Patient not taking: Reported on 11/29/2022) 420 mL 1   No facility-administered medications prior to visit.    No Known Allergies     Objective:    BP 139/80   Pulse 82   Ht  '5\' 5"'$  (1.651 m)   Wt 219 lb (99.3 kg)   SpO2 100%   BMI 36.44 kg/m  Wt Readings from Last 3 Encounters:  11/29/22 219 lb (99.3 kg)  08/30/22 221 lb 4.8 oz (100.4 kg)  05/24/22 215 lb (97.5 kg)    Physical Exam Vitals and nursing note reviewed.  Constitutional:      Appearance: He is well-developed.  HENT:     Head: Normocephalic and atraumatic.  Cardiovascular:     Rate and Rhythm: Normal rate and regular rhythm.     Heart sounds: Normal heart sounds.  No murmur heard.    No friction rub. No gallop.  Pulmonary:     Effort: Pulmonary effort is normal. No tachypnea or respiratory distress.     Breath sounds: Normal breath sounds. No decreased breath sounds, wheezing, rhonchi or rales.  Chest:     Chest wall: No tenderness.  Abdominal:     General: Bowel sounds are normal.     Palpations: Abdomen is soft.  Musculoskeletal:     Right shoulder: No swelling, deformity or bony tenderness. Decreased range of motion. Normal strength.     Cervical back: Normal range of motion.  Skin:    General: Skin is warm and dry.  Neurological:     Mental Status: He is alert and oriented to person, place, and time.     Coordination: Coordination normal.  Psychiatric:        Behavior: Behavior normal. Behavior is cooperative.        Thought Content: Thought content normal.        Judgment: Judgment normal.          Patient has been counseled extensively about nutrition and exercise as well as the importance of adherence with medications and regular follow-up. The patient was given clear instructions to go to ER or return to medical center if symptoms don't improve, worsen or new problems develop. The patient verbalized understanding.   Follow-up: Return for 4 weeks meter check with LUKE. see me in 3 weeks. Gildardo Pounds, FNP-BC [facility] [place], [place]   11/29/2022, 10:51 AM

## 2022-11-29 NOTE — Patient Instructions (Signed)
  Sent Referral to [place] # [phone] address [place]

## 2022-11-30 LAB — CMP14+EGFR
ALT: 14 IU/L (ref 0–44)
AST: 14 IU/L (ref 0–40)
Albumin/Globulin Ratio: 1.4 (ref 1.2–2.2)
Albumin: 4.3 g/dL (ref 3.9–4.9)
Alkaline Phosphatase: 82 IU/L (ref 44–121)
BUN/Creatinine Ratio: 16 (ref 10–24)
BUN: 16 mg/dL (ref 8–27)
Bilirubin Total: 0.3 mg/dL (ref 0.0–1.2)
CO2: 23 mmol/L (ref 20–29)
Calcium: 9.5 mg/dL (ref 8.6–10.2)
Chloride: 100 mmol/L (ref 96–106)
Creatinine, Ser: 1 mg/dL (ref 0.76–1.27)
Globulin, Total: 3 g/dL (ref 1.5–4.5)
Glucose: 347 mg/dL — ABNORMAL HIGH (ref 70–99)
Potassium: 4.5 mmol/L (ref 3.5–5.2)
Sodium: 138 mmol/L (ref 134–144)
Total Protein: 7.3 g/dL (ref 6.0–8.5)
eGFR: 84 mL/min/{1.73_m2} (ref >59)

## 2022-11-30 LAB — LIPID PANEL
Chol/HDL Ratio: 4.6 ratio (ref 0.0–5.0)
Cholesterol, Total: 195 mg/dL (ref 100–199)
HDL: 42 mg/dL (ref >39)
LDL Chol Calc (NIH): 107 mg/dL — ABNORMAL HIGH (ref 0–99)
Triglycerides: 266 mg/dL — ABNORMAL HIGH (ref 0–149)
VLDL Cholesterol Cal: 46 mg/dL — ABNORMAL HIGH (ref 5–40)

## 2022-12-02 ENCOUNTER — Other Ambulatory Visit: Payer: Self-pay | Admitting: Nurse Practitioner

## 2022-12-02 DIAGNOSIS — G8929 Other chronic pain: Secondary | ICD-10-CM

## 2022-12-11 ENCOUNTER — Encounter: Payer: Self-pay | Admitting: Sports Medicine

## 2022-12-11 ENCOUNTER — Ambulatory Visit (INDEPENDENT_AMBULATORY_CARE_PROVIDER_SITE_OTHER): Payer: Medicaid Other | Admitting: Sports Medicine

## 2022-12-11 DIAGNOSIS — M25511 Pain in right shoulder: Secondary | ICD-10-CM | POA: Diagnosis not present

## 2022-12-11 DIAGNOSIS — M12811 Other specific arthropathies, not elsewhere classified, right shoulder: Secondary | ICD-10-CM

## 2022-12-11 DIAGNOSIS — E1142 Type 2 diabetes mellitus with diabetic polyneuropathy: Secondary | ICD-10-CM | POA: Diagnosis not present

## 2022-12-11 DIAGNOSIS — G8929 Other chronic pain: Secondary | ICD-10-CM | POA: Diagnosis not present

## 2022-12-11 MED ORDER — LIDOCAINE HCL 1 % IJ SOLN
2.0000 mL | INTRAMUSCULAR | Status: AC | PRN
  Administered 2022-12-11: 2 mL

## 2022-12-11 MED ORDER — BUPIVACAINE HCL 0.25 % IJ SOLN
2.0000 mL | INTRAMUSCULAR | Status: AC | PRN
  Administered 2022-12-11: 2 mL via INTRA_ARTICULAR

## 2022-12-11 MED ORDER — METHYLPREDNISOLONE ACETATE 40 MG/ML IJ SUSP
40.0000 mg | INTRAMUSCULAR | Status: AC | PRN
  Administered 2022-12-11: 40 mg via INTRA_ARTICULAR

## 2022-12-11 NOTE — Progress Notes (Signed)
Right shoulder pain 4-5 months No injury No radicular pain Has not had any treatment for this shoulder Does take ibuprofen for pain, no relief  Limited ROM due to the pain

## 2022-12-11 NOTE — Progress Notes (Signed)
Joe Wallace - 62 y.o. male MRN [id number]  Date of birth: 1961/06/18  Office Visit Note: Visit Date: 12/11/2022 PCP: Joe Pounds, NP Referred by: Joe Pounds, NP  Subjective: Chief Complaint  Patient presents with   Right Shoulder - Pain   HPI: Joe Wallace is a pleasant 62 y.o. male who presents today for chronic right shoulder pain.  The use of an in person Arabic interpreter was used for the entirety of the visit today.  Last seen at our clinic on 12/04/2019.   Right shoulder pain - has been having pain for about 5 months now but has been worse recently.  Denies any specific injury.  Pain is localized over the lateral aspect of the shoulder.  He denies any numbness or tingling or radicular pain.  The pain will start at the top of the lateral shoulder and radiate into the deltoid.  He does take ibuprofen as needed for pain control, with minimal relief.  She has not had any physical therapy or other treatment modalities to date.  There is note some limited range of motion secondary to the pain.  Last A1c on 11/29/22 was 9.6. On Metformin 1000 mg BID. Glipizide 10mg , Jardiance 25mg ; he is not on insulin. AM sugar was 174. He is fasting, also walking for exercise.  Pertinent ROS were reviewed with the patient and found to be negative unless otherwise specified above in HPI.   Assessment & Plan: Visit Diagnoses:  1. Chronic right shoulder pain   2. Type 2 diabetes mellitus with diabetic polyneuropathy, without long-term current use of insulin ([place])   3. Rotator cuff arthropathy of right shoulder    Plan: Discussed with Joe Wallace at for his chronic right shoulder pain, I do think he has some rotator cuff related pathology, otherwise questioning whether there is concomitant adhesive capsulitis.  He has some restriction in range of motion, but I am able to take him slightly further passively before guarding.  He has had type 2 diabetes that has been somewhat uncontrolled  recently, this could contribute to his frozen shoulder picture.  Discussed with him that we need to increase his range of motion, a referral to formalized physical therapy was provided today.  Through shared decision-making, did proceed with a subacromial joint injection.  I would like to see what sort of relief he gets from this in 2 weeks, but this was purely rotator cuff related his range of motion should markedly improve, however if it does not then I think we are looking more at a chronic adhesive capsulitis picture.  The response to this will help guide treatment for his formalized physical therapy.  Irregardless, he needs to tighten up his glucose control and improve his diabetic management - he will continue on Metformin 1000 mg BID. Glipizide 10mg , Jardiance 25mg  daily. May use ice/tylenol for any post-injection pain. Discussed healthy eating habits and lifestyle intervention as well to help with this. F/u in 2 weeks   Follow-up: Return in about 2 weeks (around 12/25/2022) for for right shoulder .   Meds & Orders: No orders of the defined types were placed in this encounter.   Orders Placed This Encounter  Procedures   Large Joint Inj: R subacromial bursa   Ambulatory referral to Physical Therapy     Procedures: Large Joint Inj: R subacromial bursa on 12/11/2022 1:30 PM Indications: pain Details: 22 G 1.5 in needle, posterior approach Medications: 2 mL lidocaine 1 %; 2 mL bupivacaine 0.25 %;  40 mg methylPREDNISolone acetate 40 MG/ML Outcome: tolerated well, no immediate complications  Subacromial Joint Injection, Right Shoulder After discussion on risks/benefits/indications, informed verbal consent was obtained. A timeout was then performed. Patient was seated on table in exam room. The patient's shoulder was prepped with betadine and alcohol swabs and utilizing posterior approach a 22G, 1.5" needle was directed anteriorly and laterally into the patient's subacromial space was injected with  2:2:1 mixture of lidocaine:bupivicaine:depomedrol with appreciation of free-flowing of the injectate into the bursal space. Patient tolerated the procedure well without immediate complications.   Procedure, treatment alternatives, risks and benefits explained, specific risks discussed. Consent was given by the patient. Immediately prior to procedure a time out was called to verify the correct patient, procedure, equipment, support staff and site/side marked as required. Patient was prepped and draped in the usual sterile fashion.          Clinical History: No specialty comments available.  He reports that he has never smoked. He has never used smokeless tobacco.  Recent Labs    05/24/22 1110 08/30/22 0953 11/29/22 1058  HGBA1C 9.5* 8.0* 9.6*    Objective:    Physical Exam  Gen: Well-appearing, in no acute distress; non-toxic CV: Well-perfused. Warm.  Resp: Breathing unlabored on room air; no wheezing. Psych: Fluid speech in conversation; appropriate affect; normal thought process Neuro: Sensation intact throughout. No gross coordination deficits.   Ortho Exam - Right shoulder: No overlying swelling or effusion.  Positive pain at Codman's point.  There is some restriction in range of motion in all planes, forward flexion to approximate 150 degrees, abduction to 130 degrees, external rotation to 45 degrees.  I am able to take him slightly further 10-15 degrees in all directions passively, then with some restriction.  Difficult to ascertain whether this is due to guarding or mechanical block.  There is pain at endrange rotation, positive empty can and resisted abduction testing. + Hawkins impingement testing.  Imaging:  *Independent review and interpretation of the right shoulder x-ray from 11/29/2022 was performed by myself today.  Humeral head sits well within the glenohumeral joint.  There is some mild glenohumeral joint narrowing.  There is bony sclerosis near the greater tuberosity  of the lateral humeral head, indicative of impingement or rotator cuff arthropathy likely.  No acute fracture otherwise bony abnormality noted.  DG Shoulder Right CLINICAL DATA:  Pain  EXAM: RIGHT SHOULDER - 2+ VIEW  COMPARISON:  None Available.  FINDINGS: Sclerosis in the lateral humeral head at the rotator cuff insertion site. No other bony or soft tissue abnormalities are identified.  IMPRESSION: Sclerosis in the lateral humeral head at the rotator cuff insertion site may be due to rotator cuff pathology.  Electronically Signed   By: Dorise Bullion III M.D.   On: 11/30/2022 13:13    Past Medical/Family/Surgical/Social History: Medications & Allergies reviewed per EMR, new medications updated. 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   Adhesive capsulitis of left shoulder 06/07/2015   Multiple thyroid nodules 12/31/2014   Left shoulder pain 12/31/2014   Type 2 diabetes mellitus without complication ([place]) 0000000   Furunculosis of skin 12/31/2014   Dyslipidemia 12/31/2014  Type 2 diabetes mellitus with diabetic polyneuropathy, without long-term current use of insulin ([place]) 02/04/2014   Knee pain, right 11/18/2013   Right knee pain 08/18/2013   Low back pain 08/18/2013   Past Medical History:  Diagnosis Date   Arthritis    Cancer ([place]) 2017   in the throat (per pt chart)   Depression    Diabetes mellitus without complication ([place])    Headache    Hyperlipidemia    Memory loss    Thyroid neoplasm    Visual disorder    LEFT EYE   Family History  Problem Relation Age of Onset   Diabetes Mother    Hypertension Mother     Hypertension Father    Diabetes Father    Colon cancer Neg Hx    Colon polyps Neg Hx    Esophageal cancer Neg Hx    Rectal cancer Neg Hx    Stomach cancer Neg Hx    Past Surgical History:  Procedure Laterality Date   head trauma     L sided fragment from war.   SHOULDER ARTHROSCOPY     THYROIDECTOMY N/A 08/13/2015   Procedure: TOTAL THYROIDECTOMY;  Surgeon: Armandina Gemma, MD;  Location: [facility];  Service: General;  Laterality: N/A;   Social History   Occupational History   Not on file  Tobacco Use   Smoking status: Never   Smokeless tobacco: Never  Vaping Use   Vaping Use: Never used  Substance and Sexual Activity   Alcohol use: No   Drug use: No   Sexual activity: Yes

## 2022-12-20 ENCOUNTER — Ambulatory Visit: Payer: Medicaid Other | Admitting: Nurse Practitioner

## 2022-12-25 ENCOUNTER — Ambulatory Visit: Payer: Medicaid Other | Admitting: Sports Medicine

## 2022-12-27 ENCOUNTER — Encounter: Payer: Self-pay | Admitting: Sports Medicine

## 2022-12-27 ENCOUNTER — Ambulatory Visit (INDEPENDENT_AMBULATORY_CARE_PROVIDER_SITE_OTHER): Payer: Medicaid Other | Admitting: Sports Medicine

## 2022-12-27 DIAGNOSIS — M12811 Other specific arthropathies, not elsewhere classified, right shoulder: Secondary | ICD-10-CM

## 2022-12-27 DIAGNOSIS — G8929 Other chronic pain: Secondary | ICD-10-CM | POA: Diagnosis not present

## 2022-12-27 DIAGNOSIS — M25511 Pain in right shoulder: Secondary | ICD-10-CM | POA: Diagnosis not present

## 2022-12-27 MED ORDER — MELOXICAM 15 MG PO TABS: 15.0000 mg | ORAL_TABLET | Freq: Every day | ORAL | 1 refills | Status: DC

## 2022-12-27 NOTE — Progress Notes (Signed)
Joe Wallace - 62 y.o. male MRN [id number]  Date of birth: [birth year]  Office Visit Note: Visit Date: 12/27/2022 PCP: Gildardo Pounds, NP Referred by: Gildardo Pounds, NP  Subjective: Chief Complaint  Patient presents with   Right Shoulder - Pain   HPI: Joe Wallace is a pleasant 62 y.o. male who presents today for follow-up of right shoulder pain. The use of an in person Arabic interpreter was used for the entirety of the visit today.  At last visit on 12/11/2022 we did proceed with subacromial joint injection.  Patient states he had significant relief a few days following this injection and has had much more range of motion.  He is now able to perform activities he cannot before such as push-ups and certain reaching motions.  He still does have some pain within the shoulder with certain motions.  No longer having any pain that radiates down the arm.  He was able to schedule a physical therapy appointment, his first appointment is on 4/8 with Joe Wallace.  He is currently not taking any medication for the pain.  Pertinent ROS were reviewed with the patient and found to be negative unless otherwise specified above in HPI.   Assessment & Plan: Visit Diagnoses:  1. Rotator cuff arthropathy of right shoulder   2. Chronic right shoulder pain    Plan: Discussed with [name] that I am pleased that he had such good relief from the subacromial joint injection, this does point to more rotator cuff related pathology and shoulder impingement.  I do not think at this point that this is adhesive capsulitis, he does have some mild arthritic change within the shoulder joint.  He will get started in formalized physical therapy, has an upcoming appointment.  To help with some breakthrough pain, we will start him on meloxicam 15 mg daily.  I would like to see how he does with therapy and the meloxicam and reevaluate in 6 weeks.  If for some reason he continues to not make improvements, next  steps would be considering advanced imaging such as MRI or proceeding with glenohumeral joint injection.  Follow-up: Return in about 6 weeks (around 02/07/2023) for for R-shoulder.   Meds & Orders:  Meds ordered this encounter  Medications   meloxicam (MOBIC) 15 MG tablet    Sig: Take 1 tablet (15 mg total) by mouth daily. For joint pain    Dispense:  30 tablet    Refill:  1   No orders of the defined types were placed in this encounter.    Procedures: No procedures performed      Clinical History: No specialty comments available.  He reports that he has never smoked. He has never used smokeless tobacco.  Recent Labs    05/24/22 1110 08/30/22 0953 11/29/22 1058  HGBA1C 9.5* 8.0* 9.6*    Objective:    Physical Exam  Gen: Well-appearing, in no acute distress; non-toxic CV: Well-perfused. Warm.  Resp: Breathing unlabored on room air; no wheezing. Psych: Fluid speech in conversation; appropriate affect; normal thought process Neuro: Sensation intact throughout. No gross coordination deficits.   Ortho Exam - Right shoulder: No bony tenderness.  Mild pain at Codman's point but improved.  He has some mild guarding with endrange lection and external rotation, but there is no bony blocking and he is much more fluid than previous examinations.  There is pain with Hawkins impingement testing, albeit less.  No gross restriction in all planes of motion.  5/5 strength throughout.  Imaging: No results found.  Past Medical/Family/Surgical/Social History: Medications & Allergies reviewed per EMR, new medications updated. 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/31/2014   Dyslipidemia 12/31/2014   Type 2 diabetes mellitus with diabetic polyneuropathy, without long-term current use of insulin 02/04/2014   Knee pain, right 11/18/2013   Right knee pain 08/18/2013   Low back pain 08/18/2013   Past Medical History:  Diagnosis Date   Arthritis    Cancer ([place]) 2017   in the throat (per pt chart)   Depression    Diabetes mellitus without complication ([place])    Headache    Hyperlipidemia    Memory loss    Thyroid neoplasm    Visual disorder    LEFT EYE   Family History  Problem Relation Age of Onset   Diabetes Mother    Hypertension Mother    Hypertension Father    Diabetes Father    Colon cancer Neg Hx    Colon polyps Neg Hx    Esophageal cancer Neg Hx    Rectal cancer Neg Hx    Stomach cancer Neg Hx    Past Surgical History:  Procedure Laterality Date   head trauma     L sided fragment from war.   SHOULDER ARTHROSCOPY     THYROIDECTOMY N/A 08/13/2015   Procedure: TOTAL THYROIDECTOMY;  Surgeon: Armandina Gemma, MD;  Location: [facility];  Service: General;  Laterality: N/A;   Social History   Occupational History   Not on file  Tobacco Use   Smoking status: Never   Smokeless tobacco: Never  Vaping Use   Vaping Use: Never used  Substance and Sexual Activity   Alcohol use: No   Drug use: No   Sexual activity: Yes

## 2022-12-27 NOTE — Progress Notes (Signed)
Doing a lot better since injection Says it helped a lot  Takes tylenol occasionally for pain

## 2022-12-29 NOTE — Therapy (Deleted)
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  Rationale for Evaluation and Treatment: {HABREHAB:27488}  ONSET DATE: ***  SUBJECTIVE:                                                                                                                                                                                      SUBJECTIVE  STATEMENT: *** Hand dominance: {MISC; OT HAND [phone]}  PERTINENT HISTORY: ***  PAIN:  Are you having pain? {OPRCPAIN:27236}  PRECAUTIONS: {Therapy precautions:24002}  WEIGHT BEARING RESTRICTIONS: {Yes ***/No:24003}  FALLS:  Has patient fallen in last 6 months? {fallsyesno:27318}  LIVING ENVIRONMENT: Lives with: {OPRC lives with:25569::"lives with their family"} Lives in: {Lives in:25570} Stairs: {opstairs:27293} Has following equipment at home: {Assistive devices:23999}  OCCUPATION: ***  PLOF: {PLOF:24004}  PATIENT GOALS:***  NEXT MD VISIT:   OBJECTIVE:   DIAGNOSTIC FINDINGS:  ***  PATIENT SURVEYS:  {rehab surveys:24030:a}  COGNITION: Overall cognitive status: {cognition:24006}     SENSATION: {sensation:27233}  POSTURE: ***  UPPER EXTREMITY ROM:   {AROM/PROM:27142} ROM Right eval Left eval  Shoulder flexion    Shoulder extension    Shoulder abduction    Shoulder adduction    Shoulder internal rotation    Shoulder external rotation    Elbow flexion    Elbow extension    Wrist flexion    Wrist extension    Wrist ulnar deviation    Wrist radial deviation    Wrist pronation    Wrist supination    (Blank rows = not tested)  UPPER EXTREMITY MMT:  MMT Right eval Left eval  Shoulder flexion    Shoulder extension    Shoulder abduction    Shoulder adduction    Shoulder internal rotation    Shoulder external rotation    Middle trapezius    Lower trapezius    Elbow flexion    Elbow extension    Wrist flexion    Wrist extension    Wrist ulnar deviation    Wrist radial deviation    Wrist pronation    Wrist supination    Grip strength (lbs)    (Blank rows = not tested)  SHOULDER SPECIAL TESTS: Impingement tests: {shoulder impingement test:25231:a} SLAP lesions: {SLAP lesions:25232} Instability tests: {shoulder instability test:25233} Rotator cuff assessment: {rotator cuff assessment:25234} Biceps assessment: {biceps  assessment:25235}  JOINT MOBILITY TESTING:  ***  PALPATION:  ***   TODAY'S TREATMENT:                                                                                                                                         DATE: ***   PATIENT EDUCATION: Education details: *** Person educated: {Person educated:25204} Education method: {Education Method:25205} Education comprehension: {Education Comprehension:25206}  HOME EXERCISE PROGRAM: ***  ASSESSMENT:  CLINICAL IMPRESSION: Patient is a *** y.o. *** who was seen today for physical therapy evaluation and treatment for ***.   OBJECTIVE IMPAIRMENTS: {opptimpairments:25111}.   ACTIVITY LIMITATIONS: {activitylimitations:27494}  PARTICIPATION LIMITATIONS: {participationrestrictions:25113}  PERSONAL FACTORS: {Personal factors:25162} are also affecting patient's functional outcome.   REHAB POTENTIAL: {rehabpotential:25112}  CLINICAL DECISION MAKING: {clinical decision making:25114}  EVALUATION COMPLEXITY: {Evaluation complexity:25115}   GOALS: Goals reviewed with patient? {yes/no:20286}  SHORT TERM GOALS: Target date: ***  *** Baseline: Goal status: {GOALSTATUS:25110}  2.  *** Baseline:  Goal status: {GOALSTATUS:25110}  3.  *** Baseline:  Goal status: {GOALSTATUS:25110}  4.  *** Baseline:  Goal status: {GOALSTATUS:25110}  5.  *** Baseline:  Goal status: {GOALSTATUS:25110}  6.  *** Baseline:  Goal status: {GOALSTATUS:25110}  LONG TERM GOALS: Target date: ***  *** Baseline:  Goal status: {GOALSTATUS:25110}  2.  *** Baseline:  Goal status: {GOALSTATUS:25110}  3.  *** Baseline:  Goal status: {GOALSTATUS:25110}  4.  *** Baseline:  Goal status: {GOALSTATUS:25110}  5.  *** Baseline:  Goal status: {GOALSTATUS:25110}  6.  *** Baseline:  Goal status: {GOALSTATUS:25110}  PLAN:  PT FREQUENCY: {rehab frequency:25116}  PT DURATION: {rehab duration:25117}  PLANNED INTERVENTIONS: {rehab  planned interventions:25118::"Therapeutic exercises","Therapeutic activity","Neuromuscular re-education","Balance training","Gait training","Patient/Family education","Self Care","Joint mobilization"}  PLAN FOR NEXT SESSION: ***   Laiana Fratus, PT 12/29/2022, 12:09 PM   Check all possible CPT codes: {cptcodes:24818}    Check all conditions that are expected to impact treatment: {Conditions expected to impact treatment:28273}   If treatment provided at initial evaluation, no treatment charged due to lack of authorization.

## 2023-01-01 ENCOUNTER — Ambulatory Visit: Payer: Medicaid Other | Attending: Nurse Practitioner | Admitting: Pharmacist

## 2023-01-01 ENCOUNTER — Ambulatory Visit: Payer: Medicaid Other | Attending: Sports Medicine | Admitting: Physical Therapy

## 2023-01-01 ENCOUNTER — Encounter: Payer: Self-pay | Admitting: Pharmacist

## 2023-01-01 DIAGNOSIS — E1142 Type 2 diabetes mellitus with diabetic polyneuropathy: Secondary | ICD-10-CM | POA: Diagnosis not present

## 2023-01-01 MED ORDER — METFORMIN HCL 1000 MG PO TABS
1000.0000 mg | ORAL_TABLET | Freq: Two times a day (BID) | ORAL | 3 refills | Status: DC
Start: 2023-01-01 — End: 2023-11-27

## 2023-01-01 NOTE — Progress Notes (Signed)
    S:    PCP: [name]  62 y.o. male who presents for diabetes evaluation, education, and management.  PMH is significant for T2DM, HLD, and history of thyroid cancer.  Patient was referred and last seen by Primary Care Provider, [name], on 11/29/2022. At last visit, A1c was elevated at 9.6% and patient declined GLP-1 therapy.   Today, patient arrives in good spirits and presents without any assistance. Reports he ran out of metformin a few days ago. Endorses his blood sugar control has improved since he has been fasting for Ramadan. Patient declines insulin therapy today, he would like to use pills only and start working out more.   Current diabetes medications include: Jardiance 25 mg daily, glipizide 10 mg BID, metformin 1000 mg BID  Patient reports adherence to taking all medications as prescribed; however, he has been out of metformin for a few days.    Insurance coverage: Medicaid   Patient denies hypoglycemic events.  Reported home fasting blood sugars: 110-130s Reported 2 hour post-meal/random blood sugars: 182 this morning, other readings 160s, 2 readings at 200s.   Patient denies nocturia (nighttime urination).  Patient denies neuropathy (nerve pain). Patient denies visual changes. Patient reports self foot exams.   Patient reported dietary habits: Eats 2 meals/day, fasting for Ramadon.    Patient-reported exercise habits: going to gym more often.    O:  7 day average blood glucose: did not bring meter to visit   Lab Results  Component Value Date   HGBA1C 9.6 (A) 11/29/2022   There were no vitals filed for this visit.  Lipid Panel     Component Value Date/Time   CHOL 195 11/29/2022 1040   TRIG 266 (H) 11/29/2022 1040   HDL 42 11/29/2022 1040   CHOLHDL 4.6 11/29/2022 1040   CHOLHDL 7.1 (H) 06/29/2016 1211   VLDL 56 (H) 06/29/2016 1211   LDLCALC 107 (H) 11/29/2022 1040    Clinical Atherosclerotic Cardiovascular Disease (ASCVD): No  The ASCVD  Risk score (Arnett DK, et al., 2019) failed to calculate for the following reasons:   Unable to determine if patient is Non-Hispanic African American    A/P: Diabetes longstanding, currently above goal based on A1c (9.6%). However, control has improved since patient has been fasting for Ramadan. Patient is able to verbalize appropriate hypoglycemia management plan. Medication adherence appears appropriate, but patient had been out of metformin for a few days. Patient does have a history of thyroid cancer so will avoid GLP-1 use. Patient declines insulin as he would like to use pills only and increase his exercise regimen.  -Continued SGLT2-I Jardiance (empagliflozin) 25 mg. Counseled on sick day rules. -Continued metformin 1000 mg BID. Refills sent. -Continued glipizide 10 mg BID.  -If A1c is >8% at follow-up and patient interested in using CGM, please consider referring patient for the LIBERATE CGM study.  -Extensively discussed pathophysiology of diabetes, recommended lifestyle interventions, dietary effects on blood sugar control.  -Counseled on s/sx of and management of hypoglycemia.  -Next A1c anticipated June 2024.   Written patient instructions provided. Patient verbalized understanding of treatment plan.  Total time in face to face counseling 30 minutes.    Follow-up:  Pharmacist PRN as patient declines therapy intensification. Consider re-referral if A1c >8% at follow up and patient interested in using CGM.  PCP visit: May 2024  Valeda Malm, Ilda Basset.D. PGY-2 Ambulatory Care Pharmacy Resident

## 2023-01-03 ENCOUNTER — Telehealth: Payer: Self-pay

## 2023-01-03 ENCOUNTER — Other Ambulatory Visit: Payer: Self-pay | Admitting: Pharmacist

## 2023-01-03 DIAGNOSIS — E782 Mixed hyperlipidemia: Secondary | ICD-10-CM

## 2023-01-03 MED ORDER — FENOFIBRATE 145 MG PO TABS
145.0000 mg | ORAL_TABLET | Freq: Every day | ORAL | 1 refills | Status: DC
Start: 2023-01-03 — End: 2023-11-27

## 2023-01-03 NOTE — Telephone Encounter (Signed)
Pt came in requesting refills on fenofibrate (TRICOR) 145 MG tablet

## 2023-01-10 ENCOUNTER — Telehealth: Payer: Self-pay

## 2023-01-10 ENCOUNTER — Other Ambulatory Visit: Payer: Self-pay | Admitting: Nurse Practitioner

## 2023-01-10 DIAGNOSIS — R413 Other amnesia: Secondary | ICD-10-CM

## 2023-01-10 NOTE — Telephone Encounter (Signed)
Patient came in with his son he presented papers that said he had severe intellectual disabilities he wanted his provider to know. Patient is also requesting a referral to a neurologist for a Cognitive Assessment.

## 2023-01-10 NOTE — Telephone Encounter (Signed)
Unable to contact patient via phone to relay that a referral has been sent in to neurology as requested.

## 2023-01-16 ENCOUNTER — Other Ambulatory Visit: Payer: Self-pay | Admitting: Nurse Practitioner

## 2023-01-16 ENCOUNTER — Telehealth: Payer: Self-pay

## 2023-01-16 DIAGNOSIS — E1142 Type 2 diabetes mellitus with diabetic polyneuropathy: Secondary | ICD-10-CM

## 2023-01-16 NOTE — Telephone Encounter (Signed)
Copied from CRM [id number]. Topic: Appointment Scheduling - Scheduling Inquiry for Clinic >> Jan 16, 2023  2:25 PM Dondra Prader A wrote: Reason for CRM: Pt states that he is having an important citizenship appointment that he has to go to and he is needing to see his PCP as soon as possible. Pt states he is needing to submit his medical report as well for citizenship. Please have PCP call pt back for appt.

## 2023-01-16 NOTE — Telephone Encounter (Signed)
Call to pharmacy- patient has only picked up original Rx- he has RF- requested RF for patient Call to patient- interpreter: Natalie#ANSO Attempted to call patient- left message Rx at pharmacy- call office if has any questions. Refilled lancets- since patient is not using insulin at this time.  Requested Prescriptions  Pending Prescriptions Disp Refills   Insulin Pen Needle (B-D UF III MINI PEN NEEDLES) 31G X 5 MM MISC 100 each 6    Sig: Use as instructed. Inject into the skin once nightly.     Endocrinology: Diabetes - Testing Supplies Passed - 01/16/2023  2:41 PM      Passed - Valid encounter within last 12 months    Recent Outpatient Visits           2 weeks ago Type 2 diabetes mellitus with diabetic polyneuropathy, without long-term current use of insulin   [place] [facility] [place], [place] L, RPH-CPP   1 month ago Type 2 diabetes mellitus with diabetic polyneuropathy, without long-term current use of insulin [facility])   [facility] [place], [place] W, NP   4 months ago Type 2 diabetes mellitus with diabetic polyneuropathy, without long-term current use of insulin [facility])   [place] [facility] [place], [place] W, NP   7 months ago Type 2 diabetes mellitus with diabetic polyneuropathy, without long-term current use of insulin [facility])   [place] [facility] Claiborne Rigg, NP   11 months ago Right arm pain   [place] [facility] Claiborne Rigg, NP       Future Appointments             In 2 weeks Claiborne Rigg, NP [facility]   In 3 weeks Madelyn Brunner, DO [facility] [place]             glucose blood (ACCU-CHEK GUIDE) test strip 200 each 6    Sig: Use as instructed. Check blood glucose level by fingerstick twice per day. E11.65     Endocrinology: Diabetes - Testing Supplies Passed -  01/16/2023  2:41 PM      Passed - Valid encounter within last 12 months    Recent Outpatient Visits           2 weeks ago Type 2 diabetes mellitus with diabetic polyneuropathy, without long-term current use of insulin   [place] [facility] [place], [place] L, RPH-CPP   1 month ago Type 2 diabetes mellitus with diabetic polyneuropathy, without long-term current use of insulin [facility])   [place] [facility] [place], [place] W, NP   4 months ago Type 2 diabetes mellitus with diabetic polyneuropathy, without long-term current use of insulin [facility])   [place] [facility] [place], [place] W, NP   7 months ago Type 2 diabetes mellitus with diabetic polyneuropathy, without long-term current use of insulin [facility])   [place] [facility] Claiborne Rigg, NP   11 months ago Right arm pain   [place] [facility] Claiborne Rigg, NP       Future Appointments             In 2 weeks Claiborne Rigg, NP [facility]   In 3 weeks Madelyn Brunner, DO [facility] Cyndia Skeeters  [employer]

## 2023-01-16 NOTE — Telephone Encounter (Signed)
Patient identified by name and date of birth.   Patient aware of response and voiced understanding.   

## 2023-01-16 NOTE — Telephone Encounter (Signed)
Medication Refill - Medication: Insulin Pen Needle (B-D UF III MINI PEN NEEDLES) 31G X 5 MM MISC glucose blood (ACCU-CHEK GUIDE) test strip  Has the patient contacted their pharmacy? Yes.    Pt states he spoke with PCP last week and medication is still not at the pharmacy.    Preferred Pharmacy (with phone number or street name): [id number] - [place], [place] - [address]  Phone: [phone] Fax: [phone]  Has the patient been seen for an appointment in the last year OR does the patient have an upcoming appointment? Yes.    Agent: Please be advised that RX refills may take up to 3 business days. We ask that you follow-up with your pharmacy.

## 2023-01-31 ENCOUNTER — Encounter: Payer: Self-pay | Admitting: Nurse Practitioner

## 2023-01-31 ENCOUNTER — Ambulatory Visit: Payer: Medicaid Other | Attending: Nurse Practitioner | Admitting: Nurse Practitioner

## 2023-01-31 VITALS — BP 136/76 | HR 70 | Ht 65.0 in | Wt 213.8 lb

## 2023-01-31 DIAGNOSIS — Z0000 Encounter for general adult medical examination without abnormal findings: Secondary | ICD-10-CM

## 2023-01-31 DIAGNOSIS — E890 Postprocedural hypothyroidism: Secondary | ICD-10-CM | POA: Diagnosis not present

## 2023-01-31 DIAGNOSIS — Z7984 Long term (current) use of oral hypoglycemic drugs: Secondary | ICD-10-CM

## 2023-01-31 DIAGNOSIS — E1142 Type 2 diabetes mellitus with diabetic polyneuropathy: Secondary | ICD-10-CM | POA: Diagnosis not present

## 2023-01-31 LAB — GLUCOSE, POCT (MANUAL RESULT ENTRY)
POC Glucose: 60 mg/dl — AB (ref 70–99)
POC Glucose: 74 mg/dl (ref 70–99)

## 2023-01-31 MED ORDER — GLIPIZIDE 10 MG PO TABS: ORAL_TABLET | ORAL | 1 refills | Status: DC

## 2023-01-31 NOTE — Progress Notes (Signed)
Assessment & Plan:  Joe Wallace was seen today for annual exam.  Diagnoses and all orders for this visit:  Encounter for annual physical exam  Postoperative hypothyroidism -     Thyroid Panel With TSH  Type 2 diabetes mellitus with diabetic polyneuropathy, without long-term current use of insulin (HCC) -     POCT glucose (manual entry) -     glipiZIDE (GLUCOTROL) 10 MG tablet; Take 1/2 tablet or 5mg  in the am and 10 mg in the pm -     POCT glucose (manual entry)    Patient has been counseled on age-appropriate routine health concerns for screening and prevention. These are reviewed and up-to-date. Referrals have been placed accordingly. Immunizations are up-to-date or declined.    Subjective:   Chief Complaint  Patient presents with   Annual Exam   HPI Joe Wallace 62 y.o. male presents to office today for annual physical exam States he feels dizzy today and requested to have his blood sugar checked. CBG was 60. He was given a glucose tablet and CBG increased to 74.   Notes hypoglycemia with readings as low as 50s before lunch. Will decrease his am dose of glipizide to 5 mg. He will continue on jardiance 25 mg daily and metformin 1000 mg BID.    He has immigration forms with him today. I instructed him that I am not able to fill out these forms as the forms require a doctor's signature and notes. I have requested he ask his psychiatrist to feel these out for him.   Review of Systems  Constitutional:  Negative for fever, malaise/fatigue and weight loss.  HENT: Negative.  Negative for nosebleeds.   Eyes: Negative.  Negative for blurred vision, double vision and photophobia.  Respiratory: Negative.  Negative for cough and shortness of breath.   Cardiovascular: Negative.  Negative for chest pain, palpitations and leg swelling.  Gastrointestinal: Negative.  Negative for heartburn, nausea and vomiting.  Genitourinary: Negative.   Musculoskeletal: Negative.  Negative for  myalgias.  Skin: Negative.   Neurological: Negative.  Negative for dizziness, focal weakness, seizures and headaches.  Endo/Heme/Allergies: Negative.   Psychiatric/Behavioral: Negative.  Negative for suicidal ideas.     Past Medical History:  Diagnosis Date   Arthritis    Cancer (HCC) 2017   in the throat (per pt chart)   Depression    Diabetes mellitus without complication (HCC)    Headache    Hyperlipidemia    Memory loss    Thyroid neoplasm    Visual disorder    LEFT EYE    Past Surgical History:  Procedure Laterality Date   head trauma     L sided fragment from war.   SHOULDER ARTHROSCOPY     THYROIDECTOMY N/A 08/13/2015   Procedure: TOTAL THYROIDECTOMY;  Surgeon: Darnell Level, MD;  Location: [facility];  Service: General;  Laterality: N/A;    Family History  Problem Relation Age of Onset   Diabetes Mother    Hypertension Mother    Hypertension Father    Diabetes Father    Colon cancer Neg Hx    Colon polyps Neg Hx    Esophageal cancer Neg Hx    Rectal cancer Neg Hx    Stomach cancer Neg Hx     Social History Reviewed with no changes to be made today.   Outpatient Medications Prior to Visit  Medication Sig Dispense Refill   Accu-Chek FastClix Lancets MISC Use to check blood sugar twice daily. E11.42  200 each 6   atorvastatin (LIPITOR) 40 MG tablet Take 1 tablet (40 mg total) by mouth daily. 90 tablet 3   Blood Glucose Monitoring Suppl (ACCU-CHEK GUIDE ME) w/Device KIT 1 kit by Does not apply route 2 (two) times daily. Use to check blood sugar twice daily. E11.42 1 kit 0   empagliflozin (JARDIANCE) 25 MG TABS tablet Take 1 tablet (25 mg total) by mouth daily before breakfast. 90 tablet 1   escitalopram (LEXAPRO) 10 MG tablet Take 1 tablet (10 mg total) by mouth daily. 90 tablet 1   fenofibrate (TRICOR) 145 MG tablet Take 1 tablet (145 mg total) by mouth daily. 90 tablet 1   glucose blood (ACCU-CHEK GUIDE) test strip Use as instructed. Check blood glucose level by  fingerstick twice per day. E11.65 200 each 6   Insulin Pen Needle (B-D UF III MINI PEN NEEDLES) 31G X 5 MM MISC Use as instructed. Inject into the skin once nightly. 100 each 6   levothyroxine (SYNTHROID) 175 MCG tablet Take 1 tablet (175 mcg total) by mouth daily before breakfast. DOSE CHAGE 90 tablet 1   meloxicam (MOBIC) 15 MG tablet Take 1 tablet (15 mg total) by mouth daily. For joint pain 30 tablet 1   metFORMIN (GLUCOPHAGE) 1000 MG tablet Take 1 tablet (1,000 mg total) by mouth 2 (two) times daily with a meal. 180 tablet 3   Vitamin D, Ergocalciferol, (DRISDOL) 1.25 MG (50000 UNIT) CAPS capsule Take 1 capsule (50,000 Units total) by mouth every 7 (seven) days. 12 capsule 0   glipiZIDE (GLUCOTROL) 10 MG tablet Take 1 tablet (10 mg total) by mouth 2 (two) times daily before a meal. 180 tablet 1   acetaminophen (TYLENOL) 500 MG tablet Take 1 tablet (500 mg total) by mouth every 6 (six) hours as needed. (Patient not taking: Reported on 01/31/2023) 30 tablet 0   aspirin 81 MG tablet Take 81 mg by mouth daily. (Patient not taking: Reported on 01/31/2023)     QUEtiapine (SEROQUEL) 100 MG tablet Take 1 tablet (100 mg total) by mouth at bedtime. For depression and anxiety (Patient not taking: Reported on 01/31/2023) 90 tablet 1   Guaifenesin 200 MG/5ML LIQD Take 10 mLs (400 mg total) by mouth every 6 (six) hours as needed. (Patient not taking: Reported on 11/29/2022) 420 mL 1   lidocaine (LIDODERM) 5 % Place 1 patch onto the skin daily. Remove & Discard patch as directed by MD (12 hours on 12 hours off) (Patient not taking: Reported on 01/31/2023) 30 patch 1   No facility-administered medications prior to visit.    No Known Allergies     Objective:    BP 136/76 (BP Location: Left Arm, Patient Position: Sitting, Cuff Size: Large)   Pulse 70   Ht 5\' 5"  (1.651 m)   Wt 213 lb 12.8 oz (97 kg)   SpO2 (!) 71%   BMI 35.58 kg/m  Wt Readings from Last 3 Encounters:  01/31/23 213 lb 12.8 oz (97 kg)  11/29/22  219 lb (99.3 kg)  08/30/22 221 lb 4.8 oz (100.4 kg)    Physical Exam Constitutional:      Appearance: He is well-developed.  HENT:     Head: Normocephalic and atraumatic.     Right Ear: Hearing, tympanic membrane, ear canal and external ear normal.     Left Ear: Hearing, tympanic membrane, ear canal and external ear normal.     Nose: Nose normal. No mucosal edema or rhinorrhea.  Right Turbinates: Not enlarged.     Left Turbinates: Not enlarged.     Mouth/Throat:     Lips: Pink.     Mouth: Mucous membranes are moist.     Dentition: No gingival swelling, dental abscesses or gum lesions.     Pharynx: Uvula midline.     Tonsils: No tonsillar exudate. 1+ on the right. 1+ on the left.  Eyes:     General: Lids are normal. No scleral icterus.    Extraocular Movements: Extraocular movements intact.     Conjunctiva/sclera: Conjunctivae normal.     Pupils: Pupils are equal, round, and reactive to light.  Neck:     Thyroid: No thyromegaly.     Trachea: No tracheal deviation.  Cardiovascular:     Rate and Rhythm: Normal rate and regular rhythm.     Heart sounds: Normal heart sounds. No murmur heard.    No friction rub. No gallop.  Pulmonary:     Effort: Pulmonary effort is normal. No respiratory distress.     Breath sounds: Normal breath sounds. No wheezing or rales.  Chest:     Chest wall: No mass or tenderness.  Breasts:    Right: No inverted nipple, mass, nipple discharge, skin change or tenderness.     Left: No inverted nipple, mass, nipple discharge, skin change or tenderness.  Abdominal:     General: Bowel sounds are normal. There is no distension.     Palpations: Abdomen is soft. There is no mass.     Tenderness: There is no abdominal tenderness. There is no guarding or rebound.  Musculoskeletal:        General: No tenderness or deformity. Normal range of motion.     Cervical back: Normal range of motion and neck supple.  Lymphadenopathy:     Cervical: No cervical  adenopathy.  Skin:    General: Skin is warm and dry.     Capillary Refill: Capillary refill takes less than 2 seconds.     Findings: No erythema.  Neurological:     Mental Status: He is alert and oriented to person, place, and time.     Cranial Nerves: No cranial nerve deficit.     Sensory: Sensation is intact.     Motor: No abnormal muscle tone.     Coordination: Coordination is intact. Coordination normal.     Gait: Gait is intact.     Deep Tendon Reflexes: Reflexes normal.     Reflex Scores:      Patellar reflexes are 1+ on the right side and 1+ on the left side. Psychiatric:        Attention and Perception: Attention normal.        Mood and Affect: Mood normal.        Speech: Speech normal.        Behavior: Behavior normal.        Thought Content: Thought content normal.        Judgment: Judgment normal.          Patient has been counseled extensively about nutrition and exercise as well as the importance of adherence with medications and regular follow-up. The patient was given clear instructions to go to ER or return to medical center if symptoms don't improve, worsen or new problems develop. The patient verbalized understanding.   Follow-up: Return in about 6 weeks (around 03/14/2023) for DM.   Claiborne Rigg, FNP-BC [facility] [place], [place] [phone]   01/31/2023, 4:50  PM

## 2023-02-02 LAB — THYROID PANEL WITH TSH
Free Thyroxine Index: 2.9 (ref 1.2–4.9)
T3 Uptake Ratio: 26 % (ref 24–39)
T4, Total: 11.2 ug/dL (ref 4.5–12.0)
TSH: 1.2 u[IU]/mL (ref 0.450–4.500)

## 2023-02-06 ENCOUNTER — Other Ambulatory Visit: Payer: Self-pay | Admitting: Nurse Practitioner

## 2023-02-06 DIAGNOSIS — E890 Postprocedural hypothyroidism: Secondary | ICD-10-CM

## 2023-02-06 MED ORDER — LEVOTHYROXINE SODIUM 175 MCG PO TABS
175.0000 ug | ORAL_TABLET | Freq: Every day | ORAL | 1 refills | Status: DC
Start: 2023-02-06 — End: 2023-05-23

## 2023-02-07 ENCOUNTER — Ambulatory Visit: Payer: Medicaid Other | Admitting: Sports Medicine

## 2023-03-15 ENCOUNTER — Other Ambulatory Visit: Payer: Self-pay | Admitting: Nurse Practitioner

## 2023-03-15 DIAGNOSIS — E1142 Type 2 diabetes mellitus with diabetic polyneuropathy: Secondary | ICD-10-CM

## 2023-03-15 MED ORDER — ACCU-CHEK GUIDE VI STRP
ORAL_STRIP | 0 refills | Status: DC
Start: 2023-03-15 — End: 2023-06-15

## 2023-03-15 MED ORDER — BD PEN NEEDLE MINI U/F 31G X 5 MM MISC
0 refills | Status: DC
Start: 2023-03-15 — End: 2023-06-15

## 2023-03-15 NOTE — Telephone Encounter (Signed)
Pt is also requesting mireazadine 15mg  and travosnscm 5mg  (both medications are not on the pt chart and I did have the pt and the interpretor spell the names of the medications. Int id: [id number])   Pt is also requesting a medication for depression/mood, pt states that he threw the box away and do not remember the name of the medication.   Routed other requested medications to refill pool for refill.

## 2023-03-15 NOTE — Telephone Encounter (Signed)
Medication Refill - Medication: Insulin Pen Needle (B-D UF III MINI PEN NEEDLES) 31G X 5 MM MISC  glucose blood (ACCU-CHEK GUIDE) test strip   Pt is also requesting mireazadine 15mg  and travosnscm 5mg  (both medications are not on the pt chart and I did have the pt and the interpretor spell the names of the medications. Int id: [id number])  Pt is also requesting a medication for depression/mood, pt states that he threw the box away and do not remember the name of the medication.   Has the patient contacted their pharmacy? No.   Preferred Pharmacy (with phone number or street name):  [facility] - [place], [place] - [address] Phone: [phone]  Fax: [phone]     Has the patient been seen for an appointment in the last year OR does the patient have an upcoming appointment? Yes.    Agent: Please be advised that RX refills may take up to 3 business days. We ask that you follow-up with your pharmacy.

## 2023-03-15 NOTE — Telephone Encounter (Signed)
Requested Prescriptions  Pending Prescriptions Disp Refills   Insulin Pen Needle (B-D UF III MINI PEN NEEDLES) 31G X 5 MM MISC 100 each 0    Sig: Use as instructed. Inject into the skin once nightly.     Endocrinology: Diabetes - Testing Supplies Passed - 03/15/2023  9:59 AM      Passed - Valid encounter within last 12 months    Recent Outpatient Visits           1 month ago Encounter for annual physical exam   [place] [facility] [place], [place] W, NP   2 months ago Type 2 diabetes mellitus with diabetic polyneuropathy, without long-term current use of insulin [facility])   [place] [facility] [place], [place] L, RPH-CPP   3 months ago Type 2 diabetes mellitus with diabetic polyneuropathy, without long-term current use of insulin [facility])   [place] [facility] [place], [place] W, NP   6 months ago Type 2 diabetes mellitus with diabetic polyneuropathy, without long-term current use of insulin [facility])   [place] [facility] [place], [place] W, NP   9 months ago Type 2 diabetes mellitus with diabetic polyneuropathy, without long-term current use of insulin [facility])   [place] [facility] [place], [place] W, NP               glucose blood (ACCU-CHEK GUIDE) test strip 200 each 0    Sig: Use as instructed. Check blood glucose level by fingerstick twice per day. E11.65     Endocrinology: Diabetes - Testing Supplies Passed - 03/15/2023  9:59 AM      Passed - Valid encounter within last 12 months    Recent Outpatient Visits           1 month ago Encounter for annual physical exam   [place] [facility] [place], [place] W, NP   2 months ago Type 2 diabetes mellitus with diabetic polyneuropathy, without long-term current use of insulin [facility])   [place] [facility] [place], [place] L, RPH-CPP   3 months ago Type 2  diabetes mellitus with diabetic polyneuropathy, without long-term current use of insulin [facility])   [place] [facility] [place], [place] W, NP   6 months ago Type 2 diabetes mellitus with diabetic polyneuropathy, without long-term current use of insulin [facility])    [facility] [place], [place] W, NP   9 months ago Type 2 diabetes mellitus with diabetic polyneuropathy, without long-term current use of insulin [facility])    [facility] [place], Shea Stakes, NP

## 2023-04-20 ENCOUNTER — Ambulatory Visit: Payer: Self-pay | Admitting: *Deleted

## 2023-04-20 NOTE — Telephone Encounter (Signed)
Reason for Disposition  Age > 60 years  Answer Assessment - Initial Assessment Questions 1. LOCATION: "Where does it hurt?"      Pt calling in with Arabic interpreter Shirline Frees  [phone]  The abd pain comes and goes.   Comes when I eat then I go to the bathroom.   Not always having diarrhea.  The pain is in my upper abd. 2. RADIATION: "Does the pain shoot anywhere else?" (e.g., chest, back)     No 3. ONSET: "When did the pain begin?" (Minutes, hours or days ago)      2-3 weeks I've been having this pain and intermittent diarrhea 4. SUDDEN: "Gradual or sudden onset?"     Not asked 5. PATTERN "Does the pain come and go, or is it constant?"    - If it comes and goes: "How long does it last?" "Do you have pain now?"     (Note: Comes and goes means the pain is intermittent. It goes away completely between bouts.)    - If constant: "Is it getting better, staying the same, or getting worse?"      (Note: Constant means the pain never goes away completely; most serious pain is constant and gets worse.)      Not asked 6. SEVERITY: "How bad is the pain?"  (e.g., Scale 1-10; mild, moderate, or severe)    - MILD (1-3): Doesn't interfere with normal activities, abdomen soft and not tender to touch.     - MODERATE (4-7): Interferes with normal activities or awakens from sleep, abdomen tender to touch.     - SEVERE (8-10): Excruciating pain, doubled over, unable to do any normal activities.       It's intermittent pain with intermittent diarrhea.  7. RECURRENT SYMPTOM: "Have you ever had this type of stomach pain before?" If Yes, ask: "When was the last time?" and "What happened that time?"      This is the first time for me. 8. CAUSE: "What do you think is causing the stomach pain?"     My son has the same issue and he has GERD. 9. RELIEVING/AGGRAVATING FACTORS: "What makes it better or worse?" (e.g., antacids, bending or twisting motion, bowel movement)     No    I had an appt but I missed it because  my son was sick. 10. OTHER SYMPTOMS: "Do you have any other symptoms?" (e.g., back pain, diarrhea, fever, urination pain, vomiting)       Intermittent diarrhea  Protocols used: Abdominal Pain - Male-A-AH

## 2023-04-20 NOTE — Telephone Encounter (Signed)
  Chief Complaint: intermittent abd pain and diarrhea Symptoms: above Frequency: For 2-3 weeks now Pertinent Negatives: Patient denies vomiting Disposition: [] ED /[] Urgent Care (no appt availability in office) / [x] Appointment(In office/virtual)/ []  [facility]/ [] Home Care/ [] Refused Recommended Disposition /[] [facility]/ []  Follow-up with PCP Additional Notes: Appt made for the Saturday clinic at [facility] with Eleonore Chiquito, FNP-C for 04/21/2023 at 11:00 in office.  Also made him an appt at his request with [name], NP for 05/23/2023 at 3:30 to replace the appt his missed due to his son being sick.

## 2023-04-21 ENCOUNTER — Ambulatory Visit: Payer: Medicaid Other | Admitting: Family

## 2023-04-23 ENCOUNTER — Ambulatory Visit: Payer: Medicaid Other | Admitting: Family

## 2023-05-23 ENCOUNTER — Ambulatory Visit: Payer: Medicaid Other | Attending: Nurse Practitioner | Admitting: Nurse Practitioner

## 2023-05-23 ENCOUNTER — Encounter: Payer: Self-pay | Admitting: Nurse Practitioner

## 2023-05-23 VITALS — BP 120/71 | HR 67 | Ht 65.0 in | Wt 212.0 lb

## 2023-05-23 DIAGNOSIS — W57XXXA Bitten or stung by nonvenomous insect and other nonvenomous arthropods, initial encounter: Secondary | ICD-10-CM

## 2023-05-23 DIAGNOSIS — D649 Anemia, unspecified: Secondary | ICD-10-CM | POA: Diagnosis not present

## 2023-05-23 DIAGNOSIS — E1142 Type 2 diabetes mellitus with diabetic polyneuropathy: Secondary | ICD-10-CM

## 2023-05-23 DIAGNOSIS — M25511 Pain in right shoulder: Secondary | ICD-10-CM

## 2023-05-23 DIAGNOSIS — E890 Postprocedural hypothyroidism: Secondary | ICD-10-CM | POA: Diagnosis not present

## 2023-05-23 DIAGNOSIS — G8929 Other chronic pain: Secondary | ICD-10-CM

## 2023-05-23 DIAGNOSIS — F32A Depression, unspecified: Secondary | ICD-10-CM

## 2023-05-23 DIAGNOSIS — F419 Anxiety disorder, unspecified: Secondary | ICD-10-CM

## 2023-05-23 DIAGNOSIS — Z1211 Encounter for screening for malignant neoplasm of colon: Secondary | ICD-10-CM

## 2023-05-23 DIAGNOSIS — Z7984 Long term (current) use of oral hypoglycemic drugs: Secondary | ICD-10-CM

## 2023-05-23 DIAGNOSIS — N529 Male erectile dysfunction, unspecified: Secondary | ICD-10-CM

## 2023-05-23 DIAGNOSIS — E559 Vitamin D deficiency, unspecified: Secondary | ICD-10-CM

## 2023-05-23 LAB — POCT GLYCOSYLATED HEMOGLOBIN (HGB A1C): Hemoglobin A1C: 8.2 % — AB (ref 4.0–5.6)

## 2023-05-23 LAB — GLUCOSE, POCT (MANUAL RESULT ENTRY): POC Glucose: 68 mg/dl — AB (ref 70–99)

## 2023-05-23 MED ORDER — MELOXICAM 15 MG PO TABS
15.0000 mg | ORAL_TABLET | Freq: Every day | ORAL | 1 refills | Status: DC
Start: 2023-05-23 — End: 2023-11-27

## 2023-05-23 MED ORDER — VITAMIN D (ERGOCALCIFEROL) 1.25 MG (50000 UNIT) PO CAPS
50000.0000 [IU] | ORAL_CAPSULE | ORAL | 0 refills | Status: DC
Start: 2023-05-23 — End: 2023-11-27

## 2023-05-23 MED ORDER — LEVOTHYROXINE SODIUM 175 MCG PO TABS
175.0000 ug | ORAL_TABLET | Freq: Every day | ORAL | 1 refills | Status: DC
Start: 2023-05-23 — End: 2024-02-21

## 2023-05-23 MED ORDER — ESCITALOPRAM OXALATE 20 MG PO TABS
30.0000 mg | ORAL_TABLET | Freq: Every day | ORAL | 1 refills | Status: DC
Start: 2023-05-23 — End: 2023-11-27

## 2023-05-23 MED ORDER — PREDNISONE 20 MG PO TABS
40.0000 mg | ORAL_TABLET | Freq: Every day | ORAL | 0 refills | Status: DC
Start: 2023-05-23 — End: 2023-11-27

## 2023-05-23 MED ORDER — SILDENAFIL CITRATE 100 MG PO TABS
50.0000 mg | ORAL_TABLET | Freq: Every day | ORAL | 11 refills | Status: DC | PRN
Start: 2023-05-23 — End: 2023-11-27

## 2023-05-23 NOTE — Progress Notes (Signed)
Assessment & Plan:  Joe Wallace was seen today for medical management of chronic issues.  Diagnoses and all orders for this visit:  Type 2 diabetes mellitus with diabetic polyneuropathy, without long-term current use of insulin (HCC) -     POCT glycosylated hemoglobin (Hb A1C) -     Urine Albumin/Creatinine with ratio (send out) [LAB689] -     POCT glucose (manual entry) -     CMP14+EGFR Continue blood sugar control as discussed in office today, low carbohydrate diet, and regular physical exercise as tolerated, 150 minutes per week (30 min each day, 5 days per week, or 50 min 3 days per week). Keep blood sugar logs with fasting goal of 90-130 mg/dl, post prandial (after you eat) less than 180.  For Hypoglycemia: BS <60 and Hyperglycemia BS >400; contact the clinic ASAP. Annual eye exams and foot exams are recommended.   Colon cancer screening -     Fecal occult blood, imunochemical([facility]/Sun[facility])  Postoperative hypothyroidism -     Thyroid Panel With TSH -     levothyroxine (SYNTHROID) 175 MCG tablet; Take 1 tablet (175 mcg total) by mouth daily before breakfast. DOSE CHAGE  Anemia, unspecified type -     CBC with Differential/Platelet  Insect bite of other part of head, initial encounter -     predniSONE (DELTASONE) 20 MG tablet; Take 2 tablets (40 mg total) by mouth daily with breakfast.  Vitamin D deficiency disease -     Vitamin D, Ergocalciferol, (DRISDOL) 1.25 MG (50000 UNIT) CAPS capsule; Take 1 capsule (50,000 Units total) by mouth every 7 (seven) days.  Anxiety and depression -     escitalopram (LEXAPRO) 20 MG tablet; Take 1.5 tablets (30 mg total) by mouth daily.  Chronic right shoulder pain -     meloxicam (MOBIC) 15 MG tablet; Take 1 tablet (15 mg total) by mouth daily. For joint pain  Vasculogenic erectile dysfunction, unspecified vasculogenic erectile dysfunction type -     sildenafil (VIAGRA) 100 MG tablet; Take 0.5-1 tablets (50-100 mg total) by mouth daily as  needed for erectile dysfunction.     Patient has been counseled on age-appropriate routine health concerns for screening and prevention. These are reviewed and up-to-date. Referrals have been placed accordingly. Immunizations are up-to-date or declined.    Subjective:   Chief Complaint  Patient presents with   Medical Management of Chronic Issues   HPI Joe Wallace 62 y.o. male presents to office today for follow up to DM2.  [name] was used to communicate directly with patient for the entire encounter including providing detailed patient instructions.    He has a past medical history of Arthritis, Cancer (2017), Depression, DM2, Headache, Hyperlipidemia, Memory loss, Thyroid neoplasm     He states he was stung by a bee on his right jaw and right arm. He has significant swelling of the right mandibular area on exam today as well the outer right upper arm.   DM 2 A1c improved but not at goal. He has not been taking glipizide twice a day and states he has only been taking it once a day. He is taking metformin as prescribed. Diabetic complications include peripheral neuropathy Lab Results  Component Value Date   HGBA1C 8.2 (A) 05/23/2023    Lab Results  Component Value Date   HGBA1C 9.6 (A) 11/29/2022     Erectile Dysfunction: Patient complains of erectile dysfunction.  Onset of dysfunction was several months ago and was gradual in onset.  Patient states  the nature of difficulty is attaining erection. Full erections occur on awakening. Partial erections occur with intercourse. Libido is not affected. Risk factors for ED include diabetes mellitus.   Hypothyroidism Thyroid levels are well controlled. He denies any symptoms of hypo or hyperthyroidism Lab Results  Component Value Date   TSH 1.200 01/31/2023     Review of Systems  Constitutional:  Negative for fever, malaise/fatigue and weight loss.  HENT: Negative.  Negative for nosebleeds.   Eyes: Negative.  Negative for  blurred vision, double vision and photophobia.  Respiratory: Negative.  Negative for cough and shortness of breath.   Cardiovascular: Negative.  Negative for chest pain, palpitations and leg swelling.  Gastrointestinal: Negative.  Negative for heartburn, nausea and vomiting.  Genitourinary:        Erectile dysfunction  Musculoskeletal:  Positive for joint pain. Negative for myalgias.  Neurological: Negative.  Negative for dizziness, focal weakness, seizures and headaches.  Psychiatric/Behavioral:  Positive for depression. Negative for suicidal ideas.     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   SHOULDER ARTHROSCOPY     THYROIDECTOMY N/A 08/13/2015   Procedure: TOTAL THYROIDECTOMY;  Surgeon: Darnell Level, MD;  Location: [facility];  Service: General;  Laterality: N/A;    Family History  Problem Relation Age of Onset   Diabetes Mother    Hypertension Mother    Hypertension Father    Diabetes Father    Colon cancer Neg Hx    Colon polyps Neg Hx    Esophageal cancer Neg Hx    Rectal cancer Neg Hx    Stomach cancer Neg Hx     Social History Reviewed with no changes to be made today.   Outpatient Medications Prior to Visit  Medication Sig Dispense Refill   Accu-Chek FastClix Lancets MISC Use to check blood sugar twice daily. E11.42 200 each 6   atorvastatin (LIPITOR) 40 MG tablet Take 1 tablet (40 mg total) by mouth daily. 90 tablet 3   Blood Glucose Monitoring Suppl (ACCU-CHEK GUIDE ME) w/Device KIT 1 kit by Does not apply route 2 (two) times daily. Use to check blood sugar twice daily. E11.42 1 kit 0   empagliflozin (JARDIANCE) 25 MG TABS tablet  Take 1 tablet (25 mg total) by mouth daily before breakfast. 90 tablet 1   fenofibrate (TRICOR) 145 MG tablet Take 1 tablet (145 mg total) by mouth daily. 90 tablet 1   glipiZIDE (GLUCOTROL) 10 MG tablet Take 1/2 tablet or 5mg  in the am and 10 mg in the pm 90 tablet 1   glucose blood (ACCU-CHEK GUIDE) test strip Use as instructed. Check blood glucose level by fingerstick twice per day. E11.65 200 each 0   Insulin Pen Needle (B-D UF III MINI PEN NEEDLES) 31G X 5 MM MISC Use as instructed. Inject into the skin once nightly. 100 each 0   metFORMIN (GLUCOPHAGE) 1000 MG tablet Take 1 tablet (1,000 mg total) by mouth 2 (two) times daily with a meal. 180 tablet 3   escitalopram (  LEXAPRO) 10 MG tablet Take 1 tablet (10 mg total) by mouth daily. 90 tablet 1   levothyroxine (SYNTHROID) 175 MCG tablet Take 1 tablet (175 mcg total) by mouth daily before breakfast. DOSE CHAGE 90 tablet 1   meloxicam (MOBIC) 15 MG tablet Take 1 tablet (15 mg total) by mouth daily. For joint pain 30 tablet 1   Vitamin D, Ergocalciferol, (DRISDOL) 1.25 MG (50000 UNIT) CAPS capsule Take 1 capsule (50,000 Units total) by mouth every 7 (seven) days. 12 capsule 0   acetaminophen (TYLENOL) 500 MG tablet Take 1 tablet (500 mg total) by mouth every 6 (six) hours as needed. (Patient not taking: Reported on 01/31/2023) 30 tablet 0   aspirin 81 MG tablet Take 81 mg by mouth daily. (Patient not taking: Reported on 01/31/2023)     QUEtiapine (SEROQUEL) 100 MG tablet Take 1 tablet (100 mg total) by mouth at bedtime. For depression and anxiety (Patient not taking: Reported on 01/31/2023) 90 tablet 1   No facility-administered medications prior to visit.    No Known Allergies     Objective:    BP 120/71 (BP Location: Left Arm, Patient Position: Sitting, Cuff Size: Large)   Pulse 67   Ht 5\' 5"  (1.651 m)   Wt 212 lb (96.2 kg)   SpO2 98%   BMI 35.28 kg/m  Wt Readings from Last 3 Encounters:  05/23/23 212 lb (96.2 kg)  01/31/23 213 lb 12.8 oz  (97 kg)  11/29/22 219 lb (99.3 kg)    Physical Exam Vitals and nursing note reviewed.  Constitutional:      Appearance: He is well-developed.  HENT:     Head: Normocephalic and atraumatic.  Neck:   Cardiovascular:     Rate and Rhythm: Normal rate and regular rhythm.     Heart sounds: Normal heart sounds. No murmur heard.    No friction rub. No gallop.  Pulmonary:     Effort: Pulmonary effort is normal. No tachypnea or respiratory distress.     Breath sounds: Normal breath sounds. No decreased breath sounds, wheezing, rhonchi or rales.  Chest:     Chest wall: No tenderness.  Abdominal:     General: Bowel sounds are normal.     Palpations: Abdomen is soft.  Musculoskeletal:        General: Normal range of motion.     Right upper arm: Swelling and tenderness present.       Arms:     Cervical back: Normal range of motion.  Lymphadenopathy:     Cervical: Cervical adenopathy present.  Skin:    General: Skin is warm and dry.  Neurological:     Mental Status: He is alert and oriented to person, place, and time.     Coordination: Coordination normal.  Psychiatric:        Behavior: Behavior normal. Behavior is cooperative.        Thought Content: Thought content normal.        Judgment: Judgment normal.          Patient has been counseled extensively about nutrition and exercise as well as the importance of adherence with medications and regular follow-up. The patient was given clear instructions to go to ER or return to medical center if symptoms don't improve, worsen or new problems develop. The patient verbalized understanding.   Follow-up: Return in about 3 months (around 08/23/2023).   Claiborne Rigg, FNP-BC [facility] [place], [place] [phone]   05/23/2023, 10:04  PM

## 2023-05-23 NOTE — Progress Notes (Signed)
Patient has an insect(Bee)  bite in arm and jaw on right side. Discharge from face bite.

## 2023-05-24 LAB — MICROALBUMIN / CREATININE URINE RATIO
Creatinine, Urine: 121.1 mg/dL
Microalb/Creat Ratio: 63 mg/g{creat} — ABNORMAL HIGH (ref 0–29)
Microalbumin, Urine: 76.5 ug/mL

## 2023-05-24 LAB — CBC WITH DIFFERENTIAL/PLATELET
Basophils Absolute: 0 10*3/uL (ref 0.0–0.2)
Basos: 0 %
EOS (ABSOLUTE): 0.2 10*3/uL (ref 0.0–0.4)
Eos: 2 %
Hematocrit: 41 % (ref 37.5–51.0)
Hemoglobin: 13 g/dL (ref 13.0–17.7)
Immature Grans (Abs): 0.1 10*3/uL (ref 0.0–0.1)
Immature Granulocytes: 1 %
Lymphocytes Absolute: 1.9 10*3/uL (ref 0.7–3.1)
Lymphs: 19 %
MCH: 25.8 pg — ABNORMAL LOW (ref 26.6–33.0)
MCHC: 31.7 g/dL (ref 31.5–35.7)
MCV: 81 fL (ref 79–97)
Monocytes Absolute: 0.6 10*3/uL (ref 0.1–0.9)
Monocytes: 6 %
Neutrophils Absolute: 6.9 10*3/uL (ref 1.4–7.0)
Neutrophils: 72 %
Platelets: 265 10*3/uL (ref 150–450)
RBC: 5.04 x10E6/uL (ref 4.14–5.80)
RDW: 12.9 % (ref 11.6–15.4)
WBC: 9.7 10*3/uL (ref 3.4–10.8)

## 2023-05-24 LAB — CMP14+EGFR
ALT: 15 IU/L (ref 0–44)
AST: 15 IU/L (ref 0–40)
Albumin: 4.2 g/dL (ref 3.9–4.9)
Alkaline Phosphatase: 56 IU/L (ref 44–121)
BUN/Creatinine Ratio: 13 (ref 10–24)
BUN: 15 mg/dL (ref 8–27)
Bilirubin Total: 0.3 mg/dL (ref 0.0–1.2)
CO2: 23 mmol/L (ref 20–29)
Calcium: 9.5 mg/dL (ref 8.6–10.2)
Chloride: 100 mmol/L (ref 96–106)
Creatinine, Ser: 1.14 mg/dL (ref 0.76–1.27)
Globulin, Total: 2.7 g/dL (ref 1.5–4.5)
Glucose: 65 mg/dL — ABNORMAL LOW (ref 70–99)
Potassium: 4.5 mmol/L (ref 3.5–5.2)
Sodium: 139 mmol/L (ref 134–144)
Total Protein: 6.9 g/dL (ref 6.0–8.5)
eGFR: 72 mL/min/{1.73_m2} (ref >59)

## 2023-05-24 LAB — THYROID PANEL WITH TSH
Free Thyroxine Index: 2.6 (ref 1.2–4.9)
T3 Uptake Ratio: 27 % (ref 24–39)
T4, Total: 9.7 ug/dL (ref 4.5–12.0)
TSH: 1.45 u[IU]/mL (ref 0.450–4.500)

## 2023-06-02 ENCOUNTER — Other Ambulatory Visit: Payer: Self-pay | Admitting: Nurse Practitioner

## 2023-06-02 DIAGNOSIS — E1142 Type 2 diabetes mellitus with diabetic polyneuropathy: Secondary | ICD-10-CM

## 2023-06-03 ENCOUNTER — Other Ambulatory Visit: Payer: Self-pay | Admitting: Nurse Practitioner

## 2023-06-03 DIAGNOSIS — R809 Proteinuria, unspecified: Secondary | ICD-10-CM

## 2023-06-03 MED ORDER — LISINOPRIL 2.5 MG PO TABS
2.5000 mg | ORAL_TABLET | Freq: Every day | ORAL | 3 refills | Status: DC
Start: 2023-06-03 — End: 2024-04-30

## 2023-06-15 ENCOUNTER — Other Ambulatory Visit: Payer: Self-pay | Admitting: Nurse Practitioner

## 2023-06-15 DIAGNOSIS — E890 Postprocedural hypothyroidism: Secondary | ICD-10-CM

## 2023-06-15 DIAGNOSIS — E1142 Type 2 diabetes mellitus with diabetic polyneuropathy: Secondary | ICD-10-CM

## 2023-06-15 NOTE — Telephone Encounter (Addendum)
Medication Refill - Medication: Insulin Pen Needle (B-D UF III MINI PEN NEEDLES) 31G X 5 MM MISC /glucose blood (ACCU-CHEK GUIDE) test/levothyroxine (SYNTHROID) 175 MCG tablet   Has the patient contacted their pharmacy? No, pt says was told by Dr Meredeth Ide to call in when he is out of these  Preferred Pharmacy (with phone number or street name):  [facility] - [place], [place] - [address] Phone: [phone]  Fax: [phone]     Has the patient been seen for an appointment in the last year OR does the patient have an upcoming appointment? yes  Agent: Please be advised that RX refills may take up to 3 business days. We ask that you follow-up with your pharmacy.

## 2023-06-18 MED ORDER — ACCU-CHEK GUIDE VI STRP
ORAL_STRIP | 0 refills | Status: AC
Start: 2023-06-18 — End: ?

## 2023-06-18 MED ORDER — BD PEN NEEDLE MINI U/F 31G X 5 MM MISC
0 refills | Status: AC
Start: 2023-06-18 — End: ?

## 2023-06-18 NOTE — Telephone Encounter (Signed)
Requested Prescriptions  Pending Prescriptions Disp Refills   Insulin Pen Needle (B-D UF III MINI PEN NEEDLES) 31G X 5 MM MISC 100 each 0    Sig: Use as instructed. Inject into the skin once nightly.     Endocrinology: Diabetes - Testing Supplies Passed - 06/15/2023  4:16 PM      Passed - Valid encounter within last 12 months    Recent Outpatient Visits           3 weeks ago Type 2 diabetes mellitus with diabetic polyneuropathy, without long-term current use of insulin (HCC)   [place] [facility] [place], Shea Stakes, NP   4 months ago Encounter for annual physical exam   [place] [facility] [place], [place] W, NP   5 months ago Type 2 diabetes mellitus with diabetic polyneuropathy, without long-term current use of insulin [facility])   [place] [facility] [place], [place] L, RPH-CPP   6 months ago Type 2 diabetes mellitus with diabetic polyneuropathy, without long-term current use of insulin [facility])   [place] [facility] [place], [place] W, NP   9 months ago Type 2 diabetes mellitus with diabetic polyneuropathy, without long-term current use of insulin [facility])   [facility] [place], Shea Stakes, NP       Future Appointments             In 2 months Claiborne Rigg, NP [facility]             levothyroxine (SYNTHROID) 175 MCG tablet 90 tablet 1    Sig: Take 1 tablet (175 mcg total) by mouth daily before breakfast. DOSE CHAGE     Endocrinology:  Hypothyroid Agents Passed - 06/15/2023  4:16 PM      Passed - TSH in normal range and within 360 days    TSH  Date Value Ref Range Status  05/23/2023 1.450 0.450 - 4.500 uIU/mL Final         Passed - Valid encounter within last 12 months    Recent Outpatient Visits           3 weeks ago Type 2 diabetes mellitus with diabetic polyneuropathy, without long-term current use of  insulin (HCC)   [place] [facility] [place], Shea Stakes, NP   4 months ago Encounter for annual physical exam   [place] [facility] [place], [place] W, NP   5 months ago Type 2 diabetes mellitus with diabetic polyneuropathy, without long-term current use of insulin [facility])   [place] [facility] [place], [place] L, RPH-CPP   6 months ago Type 2 diabetes mellitus with diabetic polyneuropathy, without long-term current use of insulin [facility])   [facility] [place], [place] W, NP   9 months ago Type 2 diabetes mellitus with diabetic polyneuropathy, without long-term current use of insulin [facility])    [facility] [place], Shea Stakes, NP       Future Appointments             In 2 months Claiborne Rigg, NP [facility]             glucose blood (ACCU-CHEK GUIDE) test strip 200 each 0    Sig: Use as instructed. Check blood glucose level  by fingerstick twice per day. E11.65     Endocrinology: Diabetes - Testing Supplies Passed - 06/15/2023  4:16 PM      Passed - Valid encounter within last 12 months    Recent Outpatient Visits           3 weeks ago Type 2 diabetes mellitus with diabetic polyneuropathy, without long-term current use of insulin [facility])   [facility] [place], Shea Stakes, NP   4 months ago Encounter for annual physical exam   [place] [facility] [place], [place] W, NP   5 months ago Type 2 diabetes mellitus with diabetic polyneuropathy, without long-term current use of insulin [facility])   [place] [facility] [place], [place] L, RPH-CPP   6 months ago Type 2 diabetes mellitus with diabetic polyneuropathy, without long-term current use of insulin [facility])   [place] [facility] [place], [place] W, NP   9  months ago Type 2 diabetes mellitus with diabetic polyneuropathy, without long-term current use of insulin [facility])   [facility] Claiborne Rigg, NP       Future Appointments             In 2 months Claiborne Rigg, NP [facility]

## 2023-06-18 NOTE — Telephone Encounter (Signed)
Requested Prescriptions  Signed Prescriptions Disp Refills   Insulin Pen Needle (B-D UF III MINI PEN NEEDLES) 31G X 5 MM MISC 100 each 0    Sig: Use as instructed. Inject into the skin once nightly.     Endocrinology: Diabetes - Testing Supplies Passed - 06/15/2023  4:16 PM      Passed - Valid encounter within last 12 months    Recent Outpatient Visits           3 weeks ago Type 2 diabetes mellitus with diabetic polyneuropathy, without long-term current use of insulin [facility])   [place] [facility] [place], Shea Stakes, NP   4 months ago Encounter for annual physical exam   [place] [facility] [place], [place] W, NP   5 months ago Type 2 diabetes mellitus with diabetic polyneuropathy, without long-term current use of insulin [facility]   [place] [facility] [place], Arthur L, RPH-CPP   6 months ago Type 2 diabetes mellitus with diabetic polyneuropathy, without long-term current use of insulin [facility])   [facility] [place], [place] W, NP   9 months ago Type 2 diabetes mellitus with diabetic polyneuropathy, without long-term current use of insulin [facility])   [place] [facility] Claiborne Rigg, NP       Future Appointments             In 2 months Claiborne Rigg, NP [facility]             glucose blood (ACCU-CHEK GUIDE) test strip 200 each 0    Sig: Use as instructed. Check blood glucose level by fingerstick twice per day. E11.65     Endocrinology: Diabetes - Testing Supplies Passed - 06/15/2023  4:16 PM      Passed - Valid encounter within last 12 months    Recent Outpatient Visits           3 weeks ago Type 2 diabetes mellitus with diabetic polyneuropathy, without long-term current use of insulin [facility])   [place] [facility] [place], Shea Stakes, NP   4 months ago Encounter for annual  physical exam   [place] [facility] [place], [place] W, NP   5 months ago Type 2 diabetes mellitus with diabetic polyneuropathy, without long-term current use of insulin [facility])   [place] [facility] [place], [place] L, RPH-CPP   6 months ago Type 2 diabetes mellitus with diabetic polyneuropathy, without long-term current use of insulin [facility])   [place] [facility] [place], [place] W, NP   9 months ago Type 2 diabetes mellitus with diabetic polyneuropathy, without long-term current use of insulin [facility])   [place] [facility] Claiborne Rigg, NP       Future Appointments             In 2 months Claiborne Rigg, NP  [facility]            Refused Prescriptions Disp Refills   levothyroxine (SYNTHROID) 175 MCG tablet 90 tablet 1    Sig: Take 1 tablet (175 mcg total) by mouth daily before breakfast. DOSE CHAGE     Endocrinology:  Hypothyroid Agents Passed - 06/15/2023  4:16 PM      Passed - TSH in normal  range and within 360 days    TSH  Date Value Ref Range Status  05/23/2023 1.450 0.450 - 4.500 uIU/mL Final         Passed - Valid encounter within last 12 months    Recent Outpatient Visits           3 weeks ago Type 2 diabetes mellitus with diabetic polyneuropathy, without long-term current use of insulin [facility])   [place] [facility] [place], Shea Stakes, NP   4 months ago Encounter for annual physical exam   [place] [facility] [place], [place] W, NP   5 months ago Type 2 diabetes mellitus with diabetic polyneuropathy, without long-term current use of insulin [facility])   [place] [facility] [place], [place] L, RPH-CPP   6 months ago Type 2 diabetes mellitus with diabetic polyneuropathy, without long-term current use of insulin [facility])   [place] [facility] [place], [place] W, NP   9 months ago Type 2 diabetes mellitus with diabetic polyneuropathy, without long-term current use of insulin [facility])   [place] [facility] [place], Shea Stakes, NP       Future Appointments             In 2 months Claiborne Rigg, NP [facility]

## 2023-07-09 ENCOUNTER — Other Ambulatory Visit: Payer: Self-pay | Admitting: Nurse Practitioner

## 2023-07-09 DIAGNOSIS — J3089 Other allergic rhinitis: Secondary | ICD-10-CM

## 2023-07-09 MED ORDER — EYE ALLERGY RELIEF 0.025-0.3 % OP SOLN
1.0000 [drp] | Freq: Four times a day (QID) | OPHTHALMIC | 0 refills | Status: DC | PRN
Start: 2023-07-09 — End: 2024-04-30

## 2023-08-24 ENCOUNTER — Ambulatory Visit: Payer: Medicaid Other | Admitting: Nurse Practitioner

## 2023-08-28 ENCOUNTER — Ambulatory Visit: Payer: Medicaid Other | Admitting: Nurse Practitioner

## 2023-10-10 ENCOUNTER — Other Ambulatory Visit: Payer: Self-pay | Admitting: Nurse Practitioner

## 2023-10-10 DIAGNOSIS — E1142 Type 2 diabetes mellitus with diabetic polyneuropathy: Secondary | ICD-10-CM

## 2023-10-11 ENCOUNTER — Telehealth: Payer: Self-pay

## 2023-10-11 NOTE — Telephone Encounter (Signed)
Pharmacy Patient Advocate Encounter   Received notification from CoverMyMeds that prior authorization for JARDIANCE is required/requested.   Insurance verification completed.   The patient is insured through [facility] .   Per test claim: PA required; PA submitted to above mentioned insurance via CoverMyMeds Key/confirmation #/EOC [id number] Status is pending

## 2023-10-12 ENCOUNTER — Other Ambulatory Visit: Payer: Self-pay

## 2023-10-12 NOTE — Telephone Encounter (Signed)
Pharmacy Patient Advocate Encounter  Received notification from [facility] that Prior Authorization for JARDIANCE has been APPROVED from 10/11/2023 to 10/10/2024   PA #/Case ID/Reference #: [id number]

## 2023-11-27 ENCOUNTER — Encounter: Payer: Self-pay | Admitting: Nurse Practitioner

## 2023-11-27 ENCOUNTER — Ambulatory Visit: Payer: Medicaid Other | Attending: Nurse Practitioner | Admitting: Nurse Practitioner

## 2023-11-27 VITALS — BP 138/80 | HR 69 | Ht 65.0 in | Wt 218.2 lb

## 2023-11-27 DIAGNOSIS — E1142 Type 2 diabetes mellitus with diabetic polyneuropathy: Secondary | ICD-10-CM | POA: Diagnosis not present

## 2023-11-27 DIAGNOSIS — E559 Vitamin D deficiency, unspecified: Secondary | ICD-10-CM

## 2023-11-27 DIAGNOSIS — G8929 Other chronic pain: Secondary | ICD-10-CM

## 2023-11-27 DIAGNOSIS — F419 Anxiety disorder, unspecified: Secondary | ICD-10-CM

## 2023-11-27 DIAGNOSIS — E782 Mixed hyperlipidemia: Secondary | ICD-10-CM

## 2023-11-27 DIAGNOSIS — M25562 Pain in left knee: Secondary | ICD-10-CM

## 2023-11-27 DIAGNOSIS — N529 Male erectile dysfunction, unspecified: Secondary | ICD-10-CM

## 2023-11-27 DIAGNOSIS — F32A Depression, unspecified: Secondary | ICD-10-CM

## 2023-11-27 DIAGNOSIS — Z7984 Long term (current) use of oral hypoglycemic drugs: Secondary | ICD-10-CM

## 2023-11-27 DIAGNOSIS — Z23 Encounter for immunization: Secondary | ICD-10-CM

## 2023-11-27 DIAGNOSIS — E890 Postprocedural hypothyroidism: Secondary | ICD-10-CM

## 2023-11-27 LAB — POCT GLYCOSYLATED HEMOGLOBIN (HGB A1C): HbA1c, POC (controlled diabetic range): 8 % — AB (ref 0.0–7.0)

## 2023-11-27 LAB — GLUCOSE, POCT (MANUAL RESULT ENTRY): POC Glucose: 122 mg/dL — AB (ref 70–99)

## 2023-11-27 MED ORDER — ESCITALOPRAM OXALATE 20 MG PO TABS: 30.0000 mg | ORAL_TABLET | Freq: Every day | ORAL | 1 refills | Status: AC

## 2023-11-27 MED ORDER — METFORMIN HCL 1000 MG PO TABS: 1000.0000 mg | ORAL_TABLET | Freq: Two times a day (BID) | ORAL | 3 refills | Status: AC

## 2023-11-27 MED ORDER — SILDENAFIL CITRATE 100 MG PO TABS: 50.0000 mg | ORAL_TABLET | Freq: Every day | ORAL | 11 refills | Status: DC | PRN

## 2023-11-27 MED ORDER — MELOXICAM 15 MG PO TABS: 15.0000 mg | ORAL_TABLET | Freq: Every day | ORAL | 1 refills | Status: DC

## 2023-11-27 MED ORDER — EMPAGLIFLOZIN 25 MG PO TABS: 25.0000 mg | ORAL_TABLET | Freq: Every day | ORAL | 0 refills | Status: DC

## 2023-11-27 MED ORDER — ATORVASTATIN CALCIUM 40 MG PO TABS: 40.0000 mg | ORAL_TABLET | Freq: Every day | ORAL | 3 refills | Status: AC

## 2023-11-27 MED ORDER — FENOFIBRATE 145 MG PO TABS: 145.0000 mg | ORAL_TABLET | Freq: Every day | ORAL | 1 refills | Status: DC

## 2023-11-27 MED ORDER — VITAMIN D (ERGOCALCIFEROL) 1.25 MG (50000 UNIT) PO CAPS: 50000.0000 [IU] | ORAL_CAPSULE | ORAL | 0 refills | Status: DC

## 2023-11-27 NOTE — Progress Notes (Signed)
 Assessment & Plan:  Joe Wallace was seen today for diabetes.  Diagnoses and all orders for this visit:  Type 2 diabetes mellitus with diabetic polyneuropathy, without long-term current use of insulin (HCC) -     POCT glucose (manual entry) -     POCT glycosylated hemoglobin (Hb A1C) -     empagliflozin (JARDIANCE) 25 MG TABS tablet; Take 1 tablet (25 mg total) by mouth daily before breakfast. -     metFORMIN (GLUCOPHAGE) 1000 MG tablet; Take 1 tablet (1,000 mg total) by mouth 2 (two) times daily with a meal. -     CMP14+EGFR  Chronic pain of left knee -     DG Knee Complete 4 Views Left; Future -     meloxicam (MOBIC) 15 MG tablet; Take 1 tablet (15 mg total) by mouth daily. For joint pain  Mixed hypertriglyceridemia -     atorvastatin (LIPITOR) 40 MG tablet; Take 1 tablet (40 mg total) by mouth daily. -     fenofibrate (TRICOR) 145 MG tablet; Take 1 tablet (145 mg total) by mouth daily. -     Lipid panel  Anxiety and depression -     escitalopram (LEXAPRO) 20 MG tablet; Take 1.5 tablets (30 mg total) by mouth daily.  Vasculogenic erectile dysfunction, unspecified vasculogenic erectile dysfunction type -     sildenafil (VIAGRA) 100 MG tablet; Take 0.5-1 tablets (50-100 mg total) by mouth daily as needed for erectile dysfunction.  Vitamin D deficiency disease -     Vitamin D, Ergocalciferol, (DRISDOL) 1.25 MG (50000 UNIT) CAPS capsule; Take 1 capsule (50,000 Units total) by mouth every 7 (seven) days.  Postoperative hypothyroidism -     Thyroid Panel With TSH    Patient has been counseled on age-appropriate routine health concerns for screening and prevention. These are reviewed and up-to-date. Referrals have been placed accordingly. Immunizations are up-to-date or declined.    Subjective:   Chief Complaint  Patient presents with   Diabetes    Patient stated to having numbness in finger and opain in foot when climbing stairs    Joe Wallace 63 y.o. male presents to  office today for  DM  [name] was used to communicate directly with patient for the entire encounter including providing detailed patient instructions.    He has a past medical history of Arthritis, Cancer (2017), Depression, DM2, Headache, Hyperlipidemia, Memory loss, Thyroid neoplasm    He notes Pain in his left lower leg and foot when climbing stairs. Also has numbness in right thumb and right pointer finger. Denies back pain when climbing stairs. Pain is described as sharp. Symptoms for both started a few weeks ago. Denies any trauma or injury. He does have a history of left knee pain and xray. He does take meloxicam prn  IMPRESSION: Mild degenerative changes noted of the medial and patellofemoral compartments. Tiny knee joint effusion cannot be excluded. No acute bony abnormality identified.     DM 2 A1c not improved much. He has not been taking glipizide as prescribed and only taking once a day. We discussed the correct way to take this today.  He is taking metformin 1000 mg BID as prescribed and jardiance 25 mg daily.   Lab Results  Component Value Date   HGBA1C 8.0 (A) 11/27/2023    Lab Results  Component Value Date   HGBA1C 8.2 (A) 05/23/2023     Blood pressure is well controlled today. He is on renal dose ACE BP Readings from Last  3 Encounters:  11/27/23 138/80  05/23/23 120/71  01/31/23 136/76     Review of Systems  Constitutional:  Negative for fever, malaise/fatigue and weight loss.  HENT: Negative.  Negative for nosebleeds.   Eyes: Negative.  Negative for blurred vision, double vision and photophobia.  Respiratory: Negative.  Negative for cough and shortness of breath.   Cardiovascular: Negative.  Negative for chest pain, palpitations and leg swelling.  Gastrointestinal: Negative.  Negative for heartburn, nausea and vomiting.  Musculoskeletal:  Positive for joint pain. Negative for myalgias.  Neurological: Negative.  Negative for dizziness, focal weakness, seizures  and headaches.  Psychiatric/Behavioral: Negative.  Negative for suicidal ideas.     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   SHOULDER ARTHROSCOPY     THYROIDECTOMY N/A 08/13/2015   Procedure: TOTAL THYROIDECTOMY;  Surgeon: Darnell Level, MD;  Location: [facility];  Service: General;  Laterality: N/A;    Family History  Problem Relation Age of Onset   Diabetes Mother    Hypertension Mother    Hypertension Father    Diabetes Father    Colon cancer Neg Hx    Colon polyps Neg Hx    Esophageal cancer Neg Hx    Rectal cancer Neg Hx    Stomach cancer Neg Hx     Social History Reviewed with no changes to be made today.   Outpatient Medications Prior to Visit  Medication Sig Dispense Refill   levothyroxine (SYNTHROID) 175 MCG tablet Take 1 tablet (175 mcg total) by mouth daily before breakfast. DOSE CHAGE 90 tablet 1   lisinopril (ZESTRIL) 2.5 MG tablet Take 1 tablet (2.5 mg total) by mouth daily. To help protect kidneys 90 tablet 3   atorvastatin (LIPITOR) 40 MG tablet Take 1 tablet (40 mg total) by mouth daily. 90 tablet 3   JARDIANCE 25 MG TABS tablet TAKE 1 TABLET BY MOUTH ONCE DAILY BEFORE BREAKFAST 90 tablet 0   meloxicam (MOBIC) 15 MG tablet Take 1 tablet (15 mg total) by mouth daily. For joint pain 30 tablet 1   metFORMIN (GLUCOPHAGE) 1000 MG tablet Take 1 tablet (1,000 mg total) by mouth 2 (two) times daily with a meal. 180 tablet 3   Accu-Chek FastClix Lancets MISC Use to check blood sugar twice daily. E11.42 200 each 6   aspirin 81 MG tablet Take 81 mg by mouth daily. (Patient not taking: Reported on 01/31/2023)     Blood Glucose Monitoring  Suppl (ACCU-CHEK GUIDE ME) w/Device KIT 1 kit by Does not apply route 2 (two) times daily. Use to check blood sugar twice daily. E11.42 1 kit 0   glipiZIDE (GLUCOTROL) 10 MG tablet Take 1/2 tablet or 5mg  in the am and 10 mg in the pm 90 tablet 1   glucose blood (ACCU-CHEK GUIDE) test strip Use as instructed. Check blood glucose level by fingerstick twice per day. E11.65 200 each 0   Insulin Pen Needle (B-D UF III MINI PEN NEEDLES) 31G X 5 MM MISC Use as instructed. Inject into the  skin once nightly. 100 each 0   naphazoline-pheniramine (EYE ALLERGY RELIEF) 0.025-0.3 % ophthalmic solution Place 1 drop into the left eye 4 (four) times daily as needed for eye irritation. 15 mL 0   acetaminophen (TYLENOL) 500 MG tablet Take 1 tablet (500 mg total) by mouth every 6 (six) hours as needed. (Patient not taking: Reported on 01/31/2023) 30 tablet 0   escitalopram (LEXAPRO) 20 MG tablet Take 1.5 tablets (30 mg total) by mouth daily. 90 tablet 1   fenofibrate (TRICOR) 145 MG tablet Take 1 tablet (145 mg total) by mouth daily. 90 tablet 1   predniSONE (DELTASONE) 20 MG tablet Take 2 tablets (40 mg total) by mouth daily with breakfast. 10 tablet 0   QUEtiapine (SEROQUEL) 100 MG tablet Take 1 tablet (100 mg total) by mouth at bedtime. For depression and anxiety (Patient not taking: Reported on 01/31/2023) 90 tablet 1   sildenafil (VIAGRA) 100 MG tablet Take 0.5-1 tablets (50-100 mg total) by mouth daily as needed for erectile dysfunction. 5 tablet 11   Vitamin D, Ergocalciferol, (DRISDOL) 1.25 MG (50000 UNIT) CAPS capsule Take 1 capsule (50,000 Units total) by mouth every 7 (seven) days. 12 capsule 0   No facility-administered medications prior to visit.    No Known Allergies     Objective:    BP 138/80   Pulse 69   Ht 5\' 5"  (1.651 m)   Wt 218 lb 3.2 oz (99 kg)   SpO2 97%   BMI 36.31 kg/m  Wt Readings from Last 3 Encounters:  11/27/23 218 lb 3.2 oz (99 kg)  05/23/23 212 lb (96.2 kg)  01/31/23 213 lb 12.8  oz (97 kg)    Physical Exam Vitals and nursing note reviewed.  Constitutional:      Appearance: He is well-developed.  HENT:     Head: Normocephalic and atraumatic.  Cardiovascular:     Rate and Rhythm: Normal rate and regular rhythm.     Heart sounds: Normal heart sounds. No murmur heard.    No friction rub. No gallop.  Pulmonary:     Effort: Pulmonary effort is normal. No tachypnea or respiratory distress.     Breath sounds: Normal breath sounds. No decreased breath sounds, wheezing, rhonchi or rales.  Chest:     Chest wall: No tenderness.  Abdominal:     General: Bowel sounds are normal.     Palpations: Abdomen is soft.  Musculoskeletal:        General: Normal range of motion.     Cervical back: Normal range of motion.  Skin:    General: Skin is warm and dry.  Neurological:     Mental Status: He is alert and oriented to person, place, and time.     Coordination: Coordination normal.  Psychiatric:        Behavior: Behavior normal. Behavior is cooperative.        Thought Content: Thought content normal.        Judgment: Judgment normal.          Patient has been counseled extensively about nutrition and exercise as well as the importance of adherence with medications and regular follow-up. The patient was given clear instructions to go to ER or return to medical center if symptoms don't improve, worsen or new problems develop. The patient verbalized understanding.   Follow-up: Return in about 3 months (around 02/27/2024).   Claiborne Rigg, FNP-BC [facility] [place], [place] [phone]   11/27/2023, 5:41  PM

## 2023-11-27 NOTE — Progress Notes (Signed)
 Joe Wallace [phone]

## 2023-11-28 ENCOUNTER — Encounter: Payer: Self-pay | Admitting: Nurse Practitioner

## 2023-11-28 LAB — LIPID PANEL
Chol/HDL Ratio: 3.3 ratio (ref 0.0–5.0)
Cholesterol, Total: 155 mg/dL (ref 100–199)
HDL: 47 mg/dL (ref >39)
LDL Chol Calc (NIH): 84 mg/dL (ref 0–99)
Triglycerides: 136 mg/dL (ref 0–149)
VLDL Cholesterol Cal: 24 mg/dL (ref 5–40)

## 2023-11-28 LAB — CMP14+EGFR
ALT: 20 IU/L (ref 0–44)
AST: 23 IU/L (ref 0–40)
Albumin: 4.3 g/dL (ref 3.9–4.9)
Alkaline Phosphatase: 72 IU/L (ref 44–121)
BUN/Creatinine Ratio: 16 (ref 10–24)
BUN: 15 mg/dL (ref 8–27)
Bilirubin Total: 0.3 mg/dL (ref 0.0–1.2)
CO2: 24 mmol/L (ref 20–29)
Calcium: 9.4 mg/dL (ref 8.6–10.2)
Chloride: 102 mmol/L (ref 96–106)
Creatinine, Ser: 0.95 mg/dL (ref 0.76–1.27)
Globulin, Total: 2.6 g/dL (ref 1.5–4.5)
Glucose: 113 mg/dL — ABNORMAL HIGH (ref 70–99)
Potassium: 4.6 mmol/L (ref 3.5–5.2)
Sodium: 139 mmol/L (ref 134–144)
Total Protein: 6.9 g/dL (ref 6.0–8.5)
eGFR: 89 mL/min/{1.73_m2} (ref >59)

## 2023-11-28 LAB — THYROID PANEL WITH TSH
Free Thyroxine Index: 1.5 (ref 1.2–4.9)
T3 Uptake Ratio: 22 % — ABNORMAL LOW (ref 24–39)
T4, Total: 6.6 ug/dL (ref 4.5–12.0)
TSH: 4.84 u[IU]/mL — ABNORMAL HIGH (ref 0.450–4.500)

## 2024-01-05 ENCOUNTER — Other Ambulatory Visit: Payer: Self-pay | Admitting: Nurse Practitioner

## 2024-01-05 DIAGNOSIS — E1142 Type 2 diabetes mellitus with diabetic polyneuropathy: Secondary | ICD-10-CM

## 2024-01-05 DIAGNOSIS — E559 Vitamin D deficiency, unspecified: Secondary | ICD-10-CM

## 2024-01-07 NOTE — Telephone Encounter (Signed)
 Requested Prescriptions  Refused Prescriptions Disp Refills   Vitamin D, Ergocalciferol, (DRISDOL) 1.25 MG (50000 UNIT) CAPS capsule [Pharmacy Med Name: Vitamin D (Ergocalciferol) 1.25 MG (50000 UT) Oral Capsule] 12 capsule 0    Sig: Take 1 capsule by mouth once a week     Endocrinology:  Vitamins - Vitamin D Supplementation 2 Failed - 01/07/2024  2:11 PM      Failed - Manual Review: Route requests for 50,000 IU strength to the provider      Failed - Vitamin D in normal range and within 360 days    Vit D, 25-Hydroxy  Date Value Ref Range Status  08/30/2022 25.9 (L) 30.0 - 100.0 ng/mL Final    Comment:    Vitamin D deficiency has been defined by the Institute of Medicine and an Endocrine Society practice guideline as a level of serum 25-OH vitamin D less than 20 ng/mL (1,2). The Endocrine Society went on to further define vitamin D insufficiency as a level between 21 and 29 ng/mL (2). 1. IOM (Institute of Medicine). 2010. Dietary reference    intakes for calcium and D. [place]: The    [employer]. 2. Holick MF, Binkley [place], Bischoff-Ferrari HA, et al.    Evaluation, treatment, and prevention of vitamin D    deficiency: an Endocrine Society clinical practice    guideline. JCEM. 2011 Jul; 96(7):1911-30.          Passed - Ca in normal range and within 360 days    Calcium  Date Value Ref Range Status  11/27/2023 9.4 8.6 - 10.2 mg/dL Final         Passed - Valid encounter within last 12 months    Recent Outpatient Visits           1 month ago Type 2 diabetes mellitus with diabetic polyneuropathy, without long-term current use of insulin (HCC)   [place] [facility] [place] [place], [place] W, NP   7 months ago Type 2 diabetes mellitus with diabetic polyneuropathy, without long-term current use of insulin (HCC)   [place] [facility] - [facility] Dept [facility] [place]. [facility] Claiborne Rigg, NP   11 months  ago Encounter for annual physical exam   [place] [facility] [place]. [facility] [place], [place] W, NP   1 year ago Type 2 diabetes mellitus with diabetic polyneuropathy, without long-term current use of insulin (HCC)   [place] [facility] - A Dept Of [place]. [facility] Yehuda [place] L, RPH-CPP   1 year ago Type 2 diabetes mellitus with diabetic polyneuropathy, without long-term current use of insulin (HCC)    [facility] - A Dept Of [place]. [facility] Claiborne Rigg, NP       Future Appointments             In 1 month Claiborne Rigg, NP [facility] - A Dept Of [place]. [facility]             JARDIANCE 25 MG TABS tablet [Pharmacy Med Name: Jardiance 25 MG Oral Tablet] 90 tablet 0    Sig: TAKE 1 TABLET BY MOUTH ONCE DAILY BEFORE BREAKFAST     Endocrinology:  Diabetes - SGLT2 Inhibitors Failed - 01/07/2024  2:11 PM      Failed - HBA1C is between  0 and 7.9 and within 180 days    HbA1c, POC (controlled diabetic range)  Date Value Ref Range Status  11/27/2023 8.0 (A) 0.0 - 7.0 % Final         Passed - Cr in normal range and within 360 days    Creat  Date Value Ref Range Status  06/29/2016 1.24 0.70 - 1.33 mg/dL Final    Comment:      For patients > or = 65 years of age: The upper reference limit for Creatinine is approximately 13% higher for people identified as African-American.      Creatinine, Ser  Date Value Ref Range Status  11/27/2023 0.95 0.76 - 1.27 mg/dL Final   Creatinine, Urine  Date Value Ref Range Status  06/29/2016 83 20 - 370 mg/dL Final         Passed - eGFR in normal range and within 360 days    GFR, Est African American  Date Value Ref Range Status  06/29/2016 74 >=60 mL/min Final   GFR calc Af Amer  Date Value Ref Range Status  06/10/2020 77 >59 mL/min/1.73 Final    Comment:    **[facility] currently reports eGFR  in compliance with the current**   recommendations of the [employer]. [facility] will   update reporting as new guidelines are published from the NKF-ASN   Task force.    GFR, Est Non African American  Date Value Ref Range Status  06/29/2016 64 >=60 mL/min Final   GFR calc non Af Amer  Date Value Ref Range Status  06/10/2020 67 >59 mL/min/1.73 Final   eGFR  Date Value Ref Range Status  11/27/2023 89 >59 mL/min/1.73 Final         Passed - Valid encounter within last 6 months    Recent Outpatient Visits           1 month ago Type 2 diabetes mellitus with diabetic polyneuropathy, without long-term current use of insulin (HCC)   [place] [facility] [place]. [facility] [place], [place] W, NP   7 months ago Type 2 diabetes mellitus with diabetic polyneuropathy, without long-term current use of insulin (HCC)   [place] [facility] - A Dept Of [place]. [facility] Collins Dean, NP   11 months ago Encounter for annual physical exam   [place] [facility] [place] [facility] Of [place]. [facility] [place], [place] W, NP   1 year ago Type 2 diabetes mellitus with diabetic polyneuropathy, without long-term current use of insulin (HCC)   [place] [facility] - A Dept Of [place]. [facility] Verdel Gitelman L, RPH-CPP   1 year ago Type 2 diabetes mellitus with diabetic polyneuropathy, without long-term current use of insulin (HCC)   [place] [facility] - A Dept Of [place]. [facility] [place] Dean, NP       Future Appointments             In 1 month Collins Dean, NP [facility] - A Dept Of [place]. [facility]

## 2024-01-08 ENCOUNTER — Emergency Department (HOSPITAL_COMMUNITY): Admission: EM | Admit: 2024-01-08 | Discharge: 2024-01-09 | Discharge status: Against Medical Advice

## 2024-01-08 NOTE — ED Notes (Signed)
 Pt called four times for triage, no response

## 2024-01-10 ENCOUNTER — Ambulatory Visit: Payer: Self-pay

## 2024-01-10 NOTE — Telephone Encounter (Signed)
  Chief Complaint: Hip Pain Symptoms: left hip pain that goes down into his toes Frequency: started over a week ago Pertinent Negatives: Patient denies Cp, SOB, weakness, numbness Disposition: [] ED /[] Urgent Care (no appt availability in office) / [] Appointment(In office/virtual)/ []  [facility]/ [] Home Care/ [] Refused Recommended Disposition /[]  Mobile Bus/ [x]  Follow-up with PCP Additional Notes: patient calling with Arabic interpreter ID [phone] on the phone. Patient states he has had left hip pain for well over a week. Patient was seen in ED on 4/15. Patient states pain goes from his hip down to his toes. Patient reports having an ultrasound not an MRI in the ED. Patient reports severe pain and none of his medications are helping. Patient is requesting additional medication be sent to his pharmacy. Explained with Good Friday tomorrow that he may have to be seen again in the ED if unable to speak with PCP office. Patient verbalizes understanding but is wanting medication sent in for me.    Copied from CRM [id number]. Topic: Clinical - Red Word Triage >> Jan 10, 2024  1:20 PM Phil Braun wrote: Red Word that prompted transfer to Nurse Triage: pt had mri and they said he had sciatica nerve. He stated that he was given 2 medicines but he is in excruciating pain and needs help now. I have arabic interpretur on the line. Reason for Disposition  [1] SEVERE pain (e.g., excruciating, unable to do any normal activities) AND [2] not improved after 2 hours of pain medicine  Answer Assessment - Initial Assessment Questions 1. LOCATION and RADIATION: "Where is the pain located?"      Hip pain down to toes 2. QUALITY: "What does the pain feel like?"  (e.g., sharp, dull, aching, burning)     Sharp pain 3. SEVERITY: "How bad is the pain?" "What does it keep you from doing?"   (Scale 1-10; or mild, moderate, severe)   -  MILD (1-3): doesn't interfere with normal activities    -  MODERATE  (4-7): interferes with normal activities (e.g., work or school) or awakens from sleep, limping    -  SEVERE (8-10): excruciating pain, unable to do any normal activities, unable to walk     10 4. ONSET: "When did the pain start?" "Does it come and go, or is it there all the time?"     A week ago 5. WORK OR EXERCISE: "Has there been any recent work or exercise that involved this part of the body?"      no 6. CAUSE: "What do you think is causing the hip pain?"      unsure 7. AGGRAVATING FACTORS: "What makes the hip pain worse?" (e.g., walking, climbing stairs, running)     Worse at night or when he moves 8. OTHER SYMPTOMS: "Do you have any other symptoms?" (e.g., back pain, pain shooting down leg,  fever, rash)     yes  Protocols used: Hip Pain-A-AH

## 2024-01-11 ENCOUNTER — Other Ambulatory Visit: Payer: Self-pay | Admitting: Nurse Practitioner

## 2024-01-11 ENCOUNTER — Other Ambulatory Visit: Payer: Self-pay

## 2024-01-11 DIAGNOSIS — M5440 Lumbago with sciatica, unspecified side: Secondary | ICD-10-CM

## 2024-01-11 MED ORDER — TRAMADOL HCL 50 MG PO TABS: 50.0000 mg | ORAL_TABLET | Freq: Two times a day (BID) | ORAL | 0 refills | Status: DC | PRN

## 2024-01-11 NOTE — Telephone Encounter (Signed)
Tramadol sent. 

## 2024-01-16 ENCOUNTER — Emergency Department (HOSPITAL_BASED_OUTPATIENT_CLINIC_OR_DEPARTMENT_OTHER)

## 2024-01-16 ENCOUNTER — Other Ambulatory Visit: Payer: Self-pay

## 2024-01-16 ENCOUNTER — Encounter (HOSPITAL_BASED_OUTPATIENT_CLINIC_OR_DEPARTMENT_OTHER): Payer: Self-pay | Admitting: Emergency Medicine

## 2024-01-16 DIAGNOSIS — Z79899 Other long term (current) drug therapy: Secondary | ICD-10-CM | POA: Diagnosis not present

## 2024-01-16 DIAGNOSIS — I10 Essential (primary) hypertension: Secondary | ICD-10-CM | POA: Diagnosis not present

## 2024-01-16 DIAGNOSIS — M545 Low back pain, unspecified: Secondary | ICD-10-CM | POA: Diagnosis present

## 2024-01-16 DIAGNOSIS — M5432 Sciatica, left side: Secondary | ICD-10-CM | POA: Diagnosis not present

## 2024-01-16 DIAGNOSIS — Z7982 Long term (current) use of aspirin: Secondary | ICD-10-CM | POA: Diagnosis not present

## 2024-01-16 NOTE — ED Triage Notes (Signed)
 Print production planner used in this triage: Pt reports a week ago went to hospital and was given meds but did not help. Given tramadol , meloxicam , robaxin.  He reports sharp pain under knee. Back pain down to both legs. Pain is so bad he cannot sleep. Pain started two weeks ago. He was told at previous visit he was having spasms. Reports pain is only in left leg. Denies fevers, IV drug use, denies any incidences of incontinence.

## 2024-01-17 ENCOUNTER — Emergency Department (HOSPITAL_BASED_OUTPATIENT_CLINIC_OR_DEPARTMENT_OTHER)
Admission: EM | Admit: 2024-01-17 | Discharge: 2024-01-17 | Disposition: A | Discharge status: Home / Self Care | Attending: Emergency Medicine | Admitting: Emergency Medicine

## 2024-01-17 DIAGNOSIS — M5432 Sciatica, left side: Secondary | ICD-10-CM

## 2024-01-17 MED ORDER — LIDOCAINE 5 % EX PTCH
3.0000 | MEDICATED_PATCH | CUTANEOUS | Status: DC
Start: 2024-01-17 — End: 2024-01-17
  Administered 2024-01-17: 3 via TRANSDERMAL
  Filled 2024-01-17: qty 3

## 2024-01-17 MED ORDER — PREDNISONE 20 MG PO TABS
ORAL_TABLET | ORAL | 0 refills | Status: DC
Start: 2024-01-17 — End: 2024-04-30

## 2024-01-17 MED ORDER — ACETAMINOPHEN 500 MG PO TABS
1000.0000 mg | ORAL_TABLET | Freq: Once | ORAL | Status: AC
  Administered 2024-01-17: 1000 mg via ORAL
  Filled 2024-01-17: qty 2

## 2024-01-17 MED ORDER — PREDNISONE 50 MG PO TABS
60.0000 mg | ORAL_TABLET | Freq: Once | ORAL | Status: AC
  Administered 2024-01-17: 60 mg via ORAL
  Filled 2024-01-17: qty 1

## 2024-01-17 MED ORDER — LIDOCAINE 5 % EX PTCH: 1.0000 | MEDICATED_PATCH | CUTANEOUS | 0 refills | Status: AC

## 2024-01-17 NOTE — ED Provider Notes (Signed)
 [place] EMERGENCY DEPARTMENT AT [facility] Provider Note   CSN: [id number] Arrival date & time: 01/16/24  1953     History  Chief Complaint  Patient presents with   Leg Pain    Joe Wallace is a 63 y.o. male.  The history is provided by the patient. The history is limited by a language barrier. A language interpreter was used.  Leg Pain Associated symptoms: back pain   Associated symptoms: no fever   Back Pain Location:  Gluteal region Quality:  Shooting Radiates to:  L thigh and L foot Pain severity:  Severe Pain is:  Same all the time Timing:  Constant Progression:  Unchanged Chronicity:  New Context: not recent injury and not twisting   Relieved by:  Nothing Worsened by:  Nothing Ineffective treatments:  Muscle relaxants Associated symptoms: leg pain   Associated symptoms: no fever, no numbness, no perianal numbness, no tingling, no weakness and no weight loss   Risk factors: no hx of cancer   Patient with HTN Symptoms of sciatica but muscle relaxants and narcotics not working      Home Medications Prior to Admission medications   Medication Sig Start Date End Date Taking? Authorizing Provider  lidocaine  (LIDODERM ) 5 % Place 1 patch onto the skin daily. Remove & Discard patch within 12 hours or as directed by MD 01/17/24  Yes Suan Pyeatt, MD  lisinopril  (ZESTRIL ) 2.5 MG tablet Take 1 tablet (2.5 mg total) by mouth daily. To help protect kidneys 06/03/23   Fleming, Zelda W, NP  predniSONE  (DELTASONE ) 20 MG tablet 3 tabs po day one, then 2 po daily x 4 days 01/17/24  Yes Meade Hogeland, MD  Accu-Chek FastClix Lancets MISC Use to check blood sugar twice daily. E11.42 08/30/22   Fleming, Zelda W, NP  aspirin 81 MG tablet Take 81 mg by mouth daily. Patient not taking: Reported on 01/31/2023    [provider]  atorvastatin  (LIPITOR) 40 MG tablet Take 1 tablet (40 mg total) by mouth daily. 11/27/23   Fleming, Zelda W, NP  Blood Glucose Monitoring  Suppl (ACCU-CHEK GUIDE ME) w/Device KIT 1 kit by Does not apply route 2 (two) times daily. Use to check blood sugar twice daily. E11.42 08/30/22   Fleming, Zelda W, NP  empagliflozin  (JARDIANCE ) 25 MG TABS tablet Take 1 tablet (25 mg total) by mouth daily before breakfast. 11/27/23   Fleming, Zelda W, NP  escitalopram  (LEXAPRO ) 20 MG tablet Take 1.5 tablets (30 mg total) by mouth daily. 11/27/23   Fleming, Zelda W, NP  fenofibrate  (TRICOR ) 145 MG tablet Take 1 tablet (145 mg total) by mouth daily. 11/27/23   Fleming, Zelda W, NP  glipiZIDE  (GLUCOTROL ) 10 MG tablet Take 1/2 tablet or 5mg  in the am and 10 mg in the pm 01/31/23   Fleming, Zelda W, NP  glucose blood (ACCU-CHEK GUIDE) test strip Use as instructed. Check blood glucose level by fingerstick twice per day. E11.65 06/18/23   Collins Dean, NP  Insulin  Pen Needle (B-D UF III MINI PEN NEEDLES) 31G X 5 MM MISC Use as instructed. Inject into the skin once nightly. 06/18/23   Fleming, Zelda W, NP  levothyroxine  (SYNTHROID ) 175 MCG tablet Take 1 tablet (175 mcg total) by mouth daily before breakfast. DOSE CHAGE 05/23/23   Fleming, Zelda W, NP  meloxicam  (MOBIC ) 15 MG tablet Take 1 tablet (15 mg total) by mouth daily. For joint pain 11/27/23   Fleming, Zelda W, NP  metFORMIN  (GLUCOPHAGE )  1000 MG tablet Take 1 tablet (1,000 mg total) by mouth 2 (two) times daily with a meal. 11/27/23   Fleming, Zelda W, NP  naphazoline-pheniramine (EYE ALLERGY  RELIEF) 0.025-0.3 % ophthalmic solution Place 1 drop into the left eye 4 (four) times daily as needed for eye irritation. 07/09/23   Fleming, Zelda W, NP  sildenafil  (VIAGRA ) 100 MG tablet Take 0.5-1 tablets (50-100 mg total) by mouth daily as needed for erectile dysfunction. 11/27/23   Fleming, Zelda W, NP  traMADol  (ULTRAM ) 50 MG tablet Take 1 tablet (50 mg total) by mouth every 12 (twelve) hours as needed. 01/11/24   Collins Dean, NP  Vitamin D , Ergocalciferol , (DRISDOL ) 1.25 MG (50000 UNIT) CAPS capsule Take 1 capsule  (50,000 Units total) by mouth every 7 (seven) days. 11/27/23   Fleming, Zelda W, NP      Allergies    Patient has no known allergies.    Review of Systems   Review of Systems  Constitutional:  Negative for fever and weight loss.  Musculoskeletal:  Positive for back pain.  Neurological:  Negative for tingling, weakness and numbness.  All other systems reviewed and are negative.   Physical Exam Updated Vital Signs BP (!) 169/90 (BP Location: Right Arm)   Pulse 69   Temp 98.3 F (36.8 C) (Oral)   Resp 18   SpO2 98%  Physical Exam Vitals and nursing note reviewed.  Constitutional:      General: He is not in acute distress.    Appearance: He is well-developed. He is not diaphoretic.  HENT:     Head: Normocephalic and atraumatic.     Nose: Nose normal.  Eyes:     Conjunctiva/sclera: Conjunctivae normal.     Pupils: Pupils are equal, round, and reactive to light.  Cardiovascular:     Rate and Rhythm: Normal rate and regular rhythm.  Pulmonary:     Effort: Pulmonary effort is normal.     Breath sounds: Normal breath sounds. No wheezing or rales.  Abdominal:     General: Bowel sounds are normal.     Palpations: Abdomen is soft.     Tenderness: There is no abdominal tenderness. There is no guarding or rebound.  Musculoskeletal:        General: Normal range of motion.     Cervical back: Normal range of motion and neck supple.     Left hip: Normal.     Left upper leg: Normal.     Left knee: Normal.     Left lower leg: Normal.     Left ankle: Normal.     Left Achilles Tendon: Normal.  Skin:    General: Skin is warm and dry.     Capillary Refill: Capillary refill takes less than 2 seconds.  Neurological:     General: No focal deficit present.     Mental Status: He is alert and oriented to person, place, and time.     Deep Tendon Reflexes: Reflexes normal.  Psychiatric:        Mood and Affect: Mood normal.        Behavior: Behavior normal.     ED Results / Procedures /  Treatments   Labs (all labs ordered are listed, but only abnormal results are displayed) Labs Reviewed - No data to display  EKG None  Radiology US  Venous Img Lower Bilateral (DVT) Result Date: 01/17/2024 CLINICAL DATA:  Back pain that shoots down both legs, with sharp pain posterior to the left knee. EXAM:  BILATERAL LOWER EXTREMITY VENOUS DOPPLER ULTRASOUND TECHNIQUE: Gray-scale sonography with graded compression, as well as color Doppler and duplex ultrasound were performed to evaluate the lower extremity deep venous systems from the level of the common femoral vein and including the common femoral, femoral, profunda femoral, popliteal and calf veins including the posterior tibial, peroneal and gastrocnemius veins when visible. The superficial great saphenous vein was also interrogated. Spectral Doppler was utilized to evaluate flow at rest and with distal augmentation maneuvers in the common femoral, femoral and popliteal veins. COMPARISON:  Anamae Rochelle 15, 2025 FINDINGS: RIGHT LOWER EXTREMITY Common Femoral Vein: No evidence of thrombus. Normal compressibility, respiratory phasicity and response to augmentation. Saphenofemoral Junction: No evidence of thrombus. Normal compressibility and flow on color Doppler imaging. Profunda Femoral Vein: No evidence of thrombus. Normal compressibility and flow on color Doppler imaging. Femoral Vein: No evidence of thrombus. Normal compressibility, respiratory phasicity and response to augmentation. Popliteal Vein: No evidence of thrombus. Normal compressibility, respiratory phasicity and response to augmentation. Calf Veins: No evidence of thrombus. Normal compressibility and flow on color Doppler imaging. Superficial Great Saphenous Vein: No evidence of thrombus. Normal compressibility. Venous Reflux:  None. Other Findings:  None. LEFT LOWER EXTREMITY Common Femoral Vein: No evidence of thrombus. Normal compressibility, respiratory phasicity and response to augmentation.  Saphenofemoral Junction: No evidence of thrombus. Normal compressibility and flow on color Doppler imaging. Profunda Femoral Vein: No evidence of thrombus. Normal compressibility and flow on color Doppler imaging. Femoral Vein: No evidence of thrombus. Normal compressibility, respiratory phasicity and response to augmentation. Popliteal Vein: No evidence of thrombus. Normal compressibility, respiratory phasicity and response to augmentation. Calf Veins: No evidence of thrombus. Normal compressibility and flow on color Doppler imaging. Superficial Great Saphenous Vein: No evidence of thrombus. Normal compressibility. Venous Reflux:  None. Other Findings:  None. IMPRESSION: No evidence of deep venous thrombosis in either lower extremity. Electronically Signed   By: Virgle Grime M.D.   On: 01/17/2024 00:31    Procedures Procedures    Medications Ordered in ED Medications  lidocaine  (LIDODERM ) 5 % 3 patch (has no administration in time range)  predniSONE  (DELTASONE ) tablet 60 mg (has no administration in time range)  acetaminophen  (TYLENOL ) tablet 1,000 mg (has no administration in time range)    ED Course/ Medical Decision Making/ A&P                                 Medical Decision Making Symptoms of sciatica but muscle relaxants and narcotics not working   Amount and/or Complexity of Data Reviewed External Data Reviewed: notes.    Details: Previous notes reviewed  Radiology: ordered.    Details: Negative DVT studies   Risk OTC drugs. Prescription drug management. Risk Details: Well appearing, ruled out for DVTs.  FROM of the LLE, LLE NVI.  Stable for discharge.  Will start steroids and refer to spine for ongoing care. Stable for discharge.      Final Clinical Impression(s) / ED Diagnoses Final diagnoses:  Sciatica of left side   No signs of systemic illness or infection. The patient is nontoxic-appearing on exam and vital signs are within normal limits.  I have reviewed the  triage vital signs and the nursing notes. Pertinent labs & imaging results that were available during my care of the patient were reviewed by me and considered in my medical decision making (see chart for details). After history, exam, and medical workup I feel  the patient has been appropriately medically screened and is safe for discharge home. Pertinent diagnoses were discussed with the patient. Patient was given return precautions.  Rx / DC Orders ED Discharge Orders          Ordered    predniSONE  (DELTASONE ) 20 MG tablet        01/17/24 0146    lidocaine  (LIDODERM ) 5 %  Every 24 hours        01/17/24 0146              Maude Hettich, MD 01/17/24 1610

## 2024-01-23 ENCOUNTER — Encounter (HOSPITAL_BASED_OUTPATIENT_CLINIC_OR_DEPARTMENT_OTHER): Payer: Self-pay

## 2024-01-23 ENCOUNTER — Emergency Department (HOSPITAL_BASED_OUTPATIENT_CLINIC_OR_DEPARTMENT_OTHER)
Admission: EM | Admit: 2024-01-23 | Discharge: 2024-01-23 | Disposition: A | Discharge status: Home / Self Care | Attending: Emergency Medicine | Admitting: Emergency Medicine

## 2024-01-23 ENCOUNTER — Other Ambulatory Visit: Payer: Self-pay

## 2024-01-23 DIAGNOSIS — Z7984 Long term (current) use of oral hypoglycemic drugs: Secondary | ICD-10-CM | POA: Diagnosis not present

## 2024-01-23 DIAGNOSIS — Z7982 Long term (current) use of aspirin: Secondary | ICD-10-CM | POA: Diagnosis not present

## 2024-01-23 DIAGNOSIS — Z794 Long term (current) use of insulin: Secondary | ICD-10-CM | POA: Insufficient documentation

## 2024-01-23 DIAGNOSIS — M5432 Sciatica, left side: Secondary | ICD-10-CM | POA: Insufficient documentation

## 2024-01-23 DIAGNOSIS — M79605 Pain in left leg: Secondary | ICD-10-CM | POA: Diagnosis not present

## 2024-01-23 DIAGNOSIS — E1140 Type 2 diabetes mellitus with diabetic neuropathy, unspecified: Secondary | ICD-10-CM | POA: Diagnosis not present

## 2024-01-23 MED ORDER — OXYCODONE-ACETAMINOPHEN 5-325 MG PO TABS: 1.0000 | ORAL_TABLET | ORAL | 0 refills | Status: DC | PRN

## 2024-01-23 NOTE — Discharge Instructions (Signed)
 Your history and exam today seem consistent with persistent sciatic pain down the left leg.  You do not have any weakness or persistent numbness or loss of bowel or bladder control.  You do not have any back pain with it.  You had a workup when you came the other day including an ultrasound that was reassuring.  We had a shared decision-making conversation and agreed to hold on extensive lab or imaging testing today and agreed to hold on transfer for MRI today.  As you have been taking the medications and report that your glucose went into the 400s with the steroids, we will not give you more steroids.  We do however feel it is reasonable to give you some stronger pain medicine to try treating this as an outpatient and continue taking the numbing patches.  We recommend calling your spine team and PCP to get seen sooner if possible and please rest and stay hydrated.  If any symptoms change or worsen acutely as we discussed, please return to the nearest emergency department for further evaluation and management.

## 2024-01-23 NOTE — ED Notes (Signed)
Pt walked with steady gait

## 2024-01-23 NOTE — ED Triage Notes (Signed)
 Pt recently seen for L hip pain and diagnosed with sciatica. Reports pain has improved but not gone away. Has an empty Prednisone  bottle in triage and is requesting a refill.

## 2024-01-23 NOTE — ED Provider Notes (Signed)
 [place] EMERGENCY DEPARTMENT AT [facility] Provider Note   CSN: [id number] Arrival date & time: 01/23/24  2048     History  Chief Complaint  Patient presents with   Medication Refill   Sciatica    Joe Wallace is a 63 y.o. male.  The history is provided by the patient and medical records. No language interpreter was used.  Medication Refill Medications/supplies requested:  Pain medication for legt scatica Reason for request:  Medications ran out Patient has complete original prescription information: yes   Leg Pain Location:  Buttock and leg Time since incident:  1 week Injury: no   Buttock location:  L buttock Leg location:  L leg and L upper leg Pain details:    Quality:  Aching and shooting   Severity:  Moderate   Timing:  Constant   Progression:  Waxing and waning Chronicity:  New Dislocation: no   Tetanus status:  Unknown Prior injury to area:  No Relieved by:  Nothing Worsened by:  Bearing weight Ineffective treatments:  None tried Associated symptoms: tingling   Associated symptoms: no back pain, no decreased ROM, no fatigue, no fever, no muscle weakness, no numbness and no swelling        Home Medications Prior to Admission medications   Medication Sig Start Date End Date Taking? Authorizing Provider  lisinopril  (ZESTRIL ) 2.5 MG tablet Take 1 tablet (2.5 mg total) by mouth daily. To help protect kidneys 06/03/23   Collins Dean, NP  Accu-Chek FastClix Lancets MISC Use to check blood sugar twice daily. E11.42 08/30/22   Fleming, Zelda W, NP  aspirin 81 MG tablet Take 81 mg by mouth daily. Patient not taking: Reported on 01/31/2023    [provider]  atorvastatin  (LIPITOR) 40 MG tablet Take 1 tablet (40 mg total) by mouth daily. 11/27/23   Fleming, Zelda W, NP  Blood Glucose Monitoring Suppl (ACCU-CHEK GUIDE ME) w/Device KIT 1 kit by Does not apply route 2 (two) times daily. Use to check blood sugar twice daily. E11.42 08/30/22    Fleming, Zelda W, NP  empagliflozin  (JARDIANCE ) 25 MG TABS tablet Take 1 tablet (25 mg total) by mouth daily before breakfast. 11/27/23   Fleming, Zelda W, NP  escitalopram  (LEXAPRO ) 20 MG tablet Take 1.5 tablets (30 mg total) by mouth daily. 11/27/23   Fleming, Zelda W, NP  fenofibrate  (TRICOR ) 145 MG tablet Take 1 tablet (145 mg total) by mouth daily. 11/27/23   Fleming, Zelda W, NP  glipiZIDE  (GLUCOTROL ) 10 MG tablet Take 1/2 tablet or 5mg  in the am and 10 mg in the pm 01/31/23   Fleming, Zelda W, NP  glucose blood (ACCU-CHEK GUIDE) test strip Use as instructed. Check blood glucose level by fingerstick twice per day. E11.65 06/18/23   Collins Dean, NP  Insulin  Pen Needle (B-D UF III MINI PEN NEEDLES) 31G X 5 MM MISC Use as instructed. Inject into the skin once nightly. 06/18/23   Fleming, Zelda W, NP  levothyroxine  (SYNTHROID ) 175 MCG tablet Take 1 tablet (175 mcg total) by mouth daily before breakfast. DOSE CHAGE 05/23/23   Fleming, Zelda W, NP  lidocaine  (LIDODERM ) 5 % Place 1 patch onto the skin daily. Remove & Discard patch within 12 hours or as directed by MD 01/17/24   Maralee Senate, April, MD  meloxicam  (MOBIC ) 15 MG tablet Take 1 tablet (15 mg total) by mouth daily. For joint pain 11/27/23   Fleming, Zelda W, NP  metFORMIN  (GLUCOPHAGE ) 1000 MG tablet  Take 1 tablet (1,000 mg total) by mouth 2 (two) times daily with a meal. 11/27/23   Fleming, Zelda W, NP  naphazoline-pheniramine (EYE ALLERGY  RELIEF) 0.025-0.3 % ophthalmic solution Place 1 drop into the left eye 4 (four) times daily as needed for eye irritation. 07/09/23   Fleming, Zelda W, NP  predniSONE  (DELTASONE ) 20 MG tablet 3 tabs po day one, then 2 po daily x 4 days 01/17/24   Palumbo, April, MD  sildenafil  (VIAGRA ) 100 MG tablet Take 0.5-1 tablets (50-100 mg total) by mouth daily as needed for erectile dysfunction. 11/27/23   Fleming, Zelda W, NP  traMADol  (ULTRAM ) 50 MG tablet Take 1 tablet (50 mg total) by mouth every 12 (twelve) hours as needed. 01/11/24    Collins Dean, NP  Vitamin D , Ergocalciferol , (DRISDOL ) 1.25 MG (50000 UNIT) CAPS capsule Take 1 capsule (50,000 Units total) by mouth every 7 (seven) days. 11/27/23   Fleming, Zelda W, NP      Allergies    Patient has no known allergies.    Review of Systems   Review of Systems  Constitutional:  Negative for chills, fatigue and fever.  HENT:  Negative for congestion.   Respiratory:  Negative for cough, chest tightness, shortness of breath and wheezing.   Cardiovascular:  Negative for chest pain, palpitations and leg swelling.  Gastrointestinal:  Negative for abdominal pain.  Genitourinary:  Negative for dysuria.  Musculoskeletal:  Negative for back pain.  Neurological:  Negative for dizziness, weakness, light-headedness, numbness (no sensory difference) and headaches.  Psychiatric/Behavioral:  Negative for agitation.   All other systems reviewed and are negative.   Physical Exam Updated Vital Signs BP (!) 162/87 (BP Location: Right Arm)   Pulse 77   Temp 97.7 F (36.5 C)   Resp 18   Ht 5\' 10"  (1.778 m)   Wt 99.8 kg   SpO2 95%   BMI 31.57 kg/m  Physical Exam Vitals and nursing note reviewed.  Constitutional:      General: He is not in acute distress.    Appearance: He is well-developed. He is not ill-appearing, toxic-appearing or diaphoretic.  HENT:     Head: Normocephalic and atraumatic.     Nose: Nose normal.     Mouth/Throat:     Mouth: Mucous membranes are moist.  Eyes:     Conjunctiva/sclera: Conjunctivae normal.  Cardiovascular:     Rate and Rhythm: Normal rate and regular rhythm.     Heart sounds: No murmur heard. Pulmonary:     Effort: Pulmonary effort is normal. No respiratory distress.     Breath sounds: Normal breath sounds. No wheezing, rhonchi or rales.  Chest:     Chest wall: No tenderness.  Abdominal:     Palpations: Abdomen is soft.     Tenderness: There is no abdominal tenderness.  Musculoskeletal:        General: Tenderness present. No  swelling.     Cervical back: Neck supple.     Right lower leg: No edema.     Left lower leg: No edema.  Skin:    General: Skin is warm and dry.     Capillary Refill: Capillary refill takes less than 2 seconds.     Findings: No erythema or rash.  Neurological:     General: No focal deficit present.     Mental Status: He is alert.     Sensory: No sensory deficit.     Motor: No weakness.  Psychiatric:  Mood and Affect: Mood normal.     ED Results / Procedures / Treatments   Labs (all labs ordered are listed, but only abnormal results are displayed) Labs Reviewed - No data to display  EKG None  Radiology No results found.  Procedures Procedures    Medications Ordered in ED Medications - No data to display  ED Course/ Medical Decision Making/ A&P                                 Medical Decision Making Risk Prescription drug management.    Bentzion Saxby is a 63 y.o. male with a past medical history significant for diabetes, hyperlipidemia, and diabetic neuropathy who presents with persistent left sciatic pain.  According to patient, he was seen about a week ago and was found to have sciatic pain in his left hip going down his leg.  He denies any trauma.  Denies fevers or chills.  He reports no loss of bowel or bladder control and no urinary symptoms.  He had a workup that was reassuring last week including a negative DVT ultrasound.  He said he was given some Lidoderm  patches and steroids and this did seem to help but after he finished them it started to worsen.  He did note his glucose went to the 400s when he first started taking the steroids.  He denies any lightheadedness, fatigue, and denies any weakness.  He says that he had some tingling in his left foot but it is not persistently numb.  It is not asleep.  He is able to ambulate.  He presents to get more medications help with the symptoms.  He denies new trauma history of this before.  On exam, lungs  clear.  Chest nontender.  Abdomen nontender.  Back nontender.  He has some tenderness in the left buttock area and hip area and it radiates down his leg.  He had no weakness or persistent numbness but just had some tingling.  He was able to ambulate.  Exam otherwise unremarkable.  We had a shared decision-making conversation discussing getting labs, imaging, or even transfer for MRI given the tingling symptoms however, as he is taken muscle relaxants, numbing patches, and some over-the-counter medications and steroids that have not fully helped, we do agree with the plan to escalate his pain medicine somewhat.  We discussed doing a different steroid taper however given the hyperglycemia he reported, we will avoid further steroids.  Patient is amenable to this.  He will call his back doctor and PCP for closer follow-up and he agrees with this plan.  Will send home with prescription for stronger pain medicine and good return precautions.  He understands that he returns he may need further imaging if he develops his concerning symptoms.  He depression or concerns and was discharged in good condition.           Final Clinical Impression(s) / ED Diagnoses Final diagnoses:  Sciatica of left side  Left leg pain    Rx / DC Orders ED Discharge Orders          Ordered    oxyCODONE-acetaminophen  (PERCOCET/ROXICET) 5-325 MG tablet  Every 4 hours PRN        01/23/24 2135            Clinical Impression: 1. Sciatica of left side   2. Left leg pain     Disposition: Discharge  Condition: Good  I have discussed the results, Dx and Tx plan with the pt(& family if present). He/she/they expressed understanding and agree(s) with the plan. Discharge instructions discussed at great length. Strict return precautions discussed and pt &/or family have verbalized understanding of the instructions. No further questions at time of discharge.    Discharge Medication List as of 01/23/2024  9:42 PM      START taking these medications   Details  oxyCODONE-acetaminophen  (PERCOCET/ROXICET) 5-325 MG tablet Take 1 tablet by mouth every 4 (four) hours as needed for severe pain (pain score 7-10)., Starting Wed 01/23/2024, Normal        Follow Up: Collins Dean, NP [address] [place] [address] [place] [address] [phone]     [place], [facility] [address] [place] [address] [phone]         Paeton Latouche, Marine Sia, MD 01/23/24 2155

## 2024-01-24 ENCOUNTER — Other Ambulatory Visit: Payer: Self-pay | Admitting: Nurse Practitioner

## 2024-01-24 DIAGNOSIS — E1142 Type 2 diabetes mellitus with diabetic polyneuropathy: Secondary | ICD-10-CM

## 2024-01-31 ENCOUNTER — Inpatient Hospital Stay: Admitting: Physician Assistant

## 2024-01-31 DIAGNOSIS — E1142 Type 2 diabetes mellitus with diabetic polyneuropathy: Secondary | ICD-10-CM

## 2024-02-21 ENCOUNTER — Other Ambulatory Visit: Payer: Self-pay | Admitting: Nurse Practitioner

## 2024-02-21 DIAGNOSIS — E890 Postprocedural hypothyroidism: Secondary | ICD-10-CM

## 2024-02-27 ENCOUNTER — Ambulatory Visit: Admitting: Nurse Practitioner

## 2024-02-28 ENCOUNTER — Other Ambulatory Visit: Payer: Self-pay | Admitting: Nurse Practitioner

## 2024-02-28 DIAGNOSIS — E890 Postprocedural hypothyroidism: Secondary | ICD-10-CM

## 2024-02-28 MED ORDER — LEVOTHYROXINE SODIUM 175 MCG PO TABS: 175.0000 ug | ORAL_TABLET | Freq: Every day | ORAL | 0 refills | Status: DC

## 2024-02-28 NOTE — Telephone Encounter (Signed)
 Copied from CRM [id number]. Topic: Clinical - Medication Question >> Feb 28, 2024  1:34 PM Thliyah D wrote: Patient doesn't know the name of the medication but needs a refill for thyroid  gland. Please call patient back to clarify what he needs refilled [phone]  [id number] - [place], [place] - [address] [place] [address] Phone: [phone] Fax: [phone] Hours: Not open 24 hours

## 2024-02-29 ENCOUNTER — Other Ambulatory Visit: Payer: Self-pay | Admitting: Nurse Practitioner

## 2024-02-29 DIAGNOSIS — G8929 Other chronic pain: Secondary | ICD-10-CM

## 2024-02-29 DIAGNOSIS — N529 Male erectile dysfunction, unspecified: Secondary | ICD-10-CM

## 2024-02-29 NOTE — Telephone Encounter (Signed)
 Levothyroxine  was refilled yesterday. Other medications pended. Routing the office because the pt is requesting that sildenafil  100mg  be written for more than 5 pills.

## 2024-02-29 NOTE — Telephone Encounter (Signed)
 Copied from CRM [id number]. Topic: Clinical - Medication Refill >> Feb 29, 2024 11:46 AM DeAngela L wrote: Medication: levothyroxine  (SYNTHROID ) 175 MCG tablet meloxicam  (MOBIC ) 15 MG tablet oxyCODONE -acetaminophen  (PERCOCET/ROXICET) 5-325 MG tablet sildenafil  (VIAGRA ) 100 MG tablet (patient would like to ask if the refill could be more than 5 pills)  Has the patient contacted their pharmacy? No (Agent: If no, request that the patient contact the pharmacy for the refill. If patient does not wish to contact the pharmacy document the reason why and proceed with request.) (Agent: If yes, when and what did the pharmacy advise?)  This is the patient's preferred pharmacy:  [id number] - [place], [place] - [address] [place] [address] Phone: [phone] Fax: [phone]  Is this the correct pharmacy for this prescription? Yes  If no, delete pharmacy and type the correct one.   Has the prescription been filled recently? Yes   Is the patient out of the medication? Yes   Has the patient been seen for an appointment in the last year OR does the patient have an upcoming appointment? Yes   Can we respond through MyChart? Yes   Agent: Please be advised that Rx refills may take up to 3 business days. We ask that you follow-up with your pharmacy.

## 2024-03-02 MED ORDER — MELOXICAM 15 MG PO TABS: 15.0000 mg | ORAL_TABLET | Freq: Every day | ORAL | 1 refills | Status: AC

## 2024-03-02 MED ORDER — SILDENAFIL CITRATE 100 MG PO TABS: 50.0000 mg | ORAL_TABLET | Freq: Every day | ORAL | 11 refills | Status: DC | PRN

## 2024-04-01 ENCOUNTER — Ambulatory Visit: Admitting: Nurse Practitioner

## 2024-04-29 ENCOUNTER — Telehealth: Payer: Self-pay | Admitting: Nurse Practitioner

## 2024-04-29 NOTE — Telephone Encounter (Signed)
 Contacted pt phone cannot be completed as dialed to confirmed appt

## 2024-04-30 ENCOUNTER — Encounter: Payer: Self-pay | Admitting: Nurse Practitioner

## 2024-04-30 ENCOUNTER — Ambulatory Visit: Attending: Nurse Practitioner | Admitting: Nurse Practitioner

## 2024-04-30 VITALS — BP 163/91 | HR 72 | Resp 19 | Ht 70.0 in | Wt 208.4 lb

## 2024-04-30 DIAGNOSIS — R809 Proteinuria, unspecified: Secondary | ICD-10-CM

## 2024-04-30 DIAGNOSIS — Z7984 Long term (current) use of oral hypoglycemic drugs: Secondary | ICD-10-CM

## 2024-04-30 DIAGNOSIS — I10 Essential (primary) hypertension: Secondary | ICD-10-CM

## 2024-04-30 DIAGNOSIS — E559 Vitamin D deficiency, unspecified: Secondary | ICD-10-CM

## 2024-04-30 DIAGNOSIS — E890 Postprocedural hypothyroidism: Secondary | ICD-10-CM

## 2024-04-30 DIAGNOSIS — E782 Mixed hyperlipidemia: Secondary | ICD-10-CM

## 2024-04-30 DIAGNOSIS — E1142 Type 2 diabetes mellitus with diabetic polyneuropathy: Secondary | ICD-10-CM | POA: Diagnosis not present

## 2024-04-30 DIAGNOSIS — R413 Other amnesia: Secondary | ICD-10-CM

## 2024-04-30 LAB — POCT GLYCOSYLATED HEMOGLOBIN (HGB A1C): Hemoglobin A1C: 9.6 % — AB (ref 4.0–5.6)

## 2024-04-30 MED ORDER — LEVOTHYROXINE SODIUM 175 MCG PO TABS: 175.0000 ug | ORAL_TABLET | Freq: Every day | ORAL | 1 refills | Status: DC

## 2024-04-30 MED ORDER — FENOFIBRATE 145 MG PO TABS: 145.0000 mg | ORAL_TABLET | Freq: Every day | ORAL | 1 refills | Status: AC

## 2024-04-30 MED ORDER — GLIPIZIDE 10 MG PO TABS: 10.0000 mg | ORAL_TABLET | Freq: Two times a day (BID) | ORAL | 1 refills | Status: AC

## 2024-04-30 MED ORDER — VITAMIN D (ERGOCALCIFEROL) 1.25 MG (50000 UNIT) PO CAPS: 50000.0000 [IU] | ORAL_CAPSULE | ORAL | 0 refills | Status: AC

## 2024-04-30 MED ORDER — FREESTYLE LIBRE 3 READER DEVI: 99 refills | Status: AC

## 2024-04-30 MED ORDER — EMPAGLIFLOZIN 25 MG PO TABS: 25.0000 mg | ORAL_TABLET | Freq: Every day | ORAL | 1 refills | Status: AC

## 2024-04-30 MED ORDER — LISINOPRIL 20 MG PO TABS: 20.0000 mg | ORAL_TABLET | Freq: Every day | ORAL | 1 refills | Status: DC

## 2024-04-30 MED ORDER — FREESTYLE LIBRE 3 SENSOR MISC: 6 refills | Status: AC
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
## 2024-05-01 ENCOUNTER — Other Ambulatory Visit: Payer: Self-pay

## 2024-05-02 ENCOUNTER — Telehealth: Payer: Self-pay

## 2024-05-02 NOTE — Telephone Encounter (Signed)
 Pharmacy Patient Advocate Encounter  Received notification from [facility] that Prior Authorization for FREESTYLE LIBRE 3 SENSOR/READER has been APPROVED from 05/01/2024 to 10/28/2024   PA #/Case ID/Reference #: [id number]

## 2024-05-23 ENCOUNTER — Telehealth: Payer: Self-pay | Admitting: Nurse Practitioner

## 2024-05-23 NOTE — Telephone Encounter (Signed)
 Called patient to confirm upcoming appointment 05/27/2024, no answer. Unable to leave VM.

## 2024-05-27 ENCOUNTER — Ambulatory Visit: Admitting: Pharmacist

## 2024-06-11 ENCOUNTER — Ambulatory Visit: Attending: Nurse Practitioner

## 2024-06-11 DIAGNOSIS — E559 Vitamin D deficiency, unspecified: Secondary | ICD-10-CM

## 2024-06-11 DIAGNOSIS — E1142 Type 2 diabetes mellitus with diabetic polyneuropathy: Secondary | ICD-10-CM

## 2024-06-11 DIAGNOSIS — E890 Postprocedural hypothyroidism: Secondary | ICD-10-CM

## 2024-06-12 LAB — CMP14+EGFR
ALT: 18 IU/L (ref 0–44)
AST: 18 IU/L (ref 0–40)
Albumin: 4.5 g/dL (ref 3.9–4.9)
Alkaline Phosphatase: 58 IU/L (ref 47–123)
BUN/Creatinine Ratio: 15 (ref 10–24)
BUN: 18 mg/dL (ref 8–27)
Bilirubin Total: 0.6 mg/dL (ref 0.0–1.2)
CO2: 22 mmol/L (ref 20–29)
Calcium: 9.4 mg/dL (ref 8.6–10.2)
Chloride: 99 mmol/L (ref 96–106)
Creatinine, Ser: 1.23 mg/dL (ref 0.76–1.27)
Globulin, Total: 2.9 g/dL (ref 1.5–4.5)
Glucose: 147 mg/dL — ABNORMAL HIGH (ref 70–99)
Potassium: 4.7 mmol/L (ref 3.5–5.2)
Sodium: 137 mmol/L (ref 134–144)
Total Protein: 7.4 g/dL (ref 6.0–8.5)
eGFR: 65 mL/min/1.73 (ref >59)

## 2024-06-12 LAB — VITAMIN D 25 HYDROXY (VIT D DEFICIENCY, FRACTURES): Vit D, 25-Hydroxy: 38.4 ng/mL (ref 30.0–100.0)

## 2024-06-12 LAB — THYROID PANEL WITH TSH
Free Thyroxine Index: 2.9 (ref 1.2–4.9)
T3 Uptake Ratio: 29 % (ref 24–39)
T4, Total: 10.1 ug/dL (ref 4.5–12.0)
TSH: 0.389 u[IU]/mL — ABNORMAL LOW (ref 0.450–4.500)

## 2024-06-25 ENCOUNTER — Ambulatory Visit: Payer: Self-pay | Admitting: Nurse Practitioner

## 2024-07-04 ENCOUNTER — Telehealth: Payer: Self-pay | Admitting: Nurse Practitioner

## 2024-07-04 NOTE — Telephone Encounter (Signed)
 Called pt but phone was unavailable.Pt is scheduled for a day where provider will not be in office.  Please resch if pt call back.

## 2024-07-08 ENCOUNTER — Telehealth: Payer: Self-pay

## 2024-07-08 NOTE — Telephone Encounter (Signed)
 He needs to see neurology for dementia workup. This is not something that I can order. Does he need a referral to neurology? Did he pay on his bill that he owes to neurology? He was seeing them before for his memory.

## 2024-07-08 NOTE — Telephone Encounter (Signed)
 Patient came in with a sticky note that said he needed a  CT head to rule in/out changes related to Dementia states he got it when he went for an imaging appointment.

## 2024-07-09 NOTE — Telephone Encounter (Signed)
 Patient identified by name and date of birth.  Interpreter used to relay information to patient.  ID # is [id number].  Patient states that he has never received a bill to pay from neurology. Would like a new referral.

## 2024-07-10 ENCOUNTER — Other Ambulatory Visit: Payer: Self-pay | Admitting: Nurse Practitioner

## 2024-07-10 DIAGNOSIS — R413 Other amnesia: Secondary | ICD-10-CM

## 2024-07-10 NOTE — Telephone Encounter (Signed)
 Contact information sent through mychart. Unable to contact patient by phone.

## 2024-07-10 NOTE — Telephone Encounter (Signed)
 Referral placed to [facility]. It appears he owes a bill to [facility] [employer]. Margaret    [facility] Address: [id number],  [place], [place] [address] Phone: [phone]

## 2024-07-29 ENCOUNTER — Encounter: Payer: Self-pay | Admitting: Physician Assistant

## 2024-07-29 ENCOUNTER — Other Ambulatory Visit: Payer: Self-pay

## 2024-08-01 ENCOUNTER — Ambulatory Visit: Payer: Self-pay | Admitting: Nurse Practitioner

## 2024-08-11 ENCOUNTER — Other Ambulatory Visit: Payer: Self-pay | Admitting: Nurse Practitioner

## 2024-08-11 DIAGNOSIS — E559 Vitamin D deficiency, unspecified: Secondary | ICD-10-CM

## 2024-09-03 ENCOUNTER — Telehealth: Payer: Self-pay | Admitting: Nurse Practitioner

## 2024-09-03 NOTE — Telephone Encounter (Signed)
 Pt unconfirmed appt 12/10(per vr ) lvm

## 2024-09-05 ENCOUNTER — Encounter: Payer: Self-pay | Admitting: Nurse Practitioner

## 2024-09-05 ENCOUNTER — Ambulatory Visit: Attending: Nurse Practitioner | Admitting: Nurse Practitioner

## 2024-09-05 VITALS — BP 137/80 | HR 69 | Ht 70.0 in | Wt 220.0 lb

## 2024-09-05 DIAGNOSIS — N529 Male erectile dysfunction, unspecified: Secondary | ICD-10-CM | POA: Diagnosis not present

## 2024-09-05 DIAGNOSIS — Z1211 Encounter for screening for malignant neoplasm of colon: Secondary | ICD-10-CM

## 2024-09-05 DIAGNOSIS — R809 Proteinuria, unspecified: Secondary | ICD-10-CM

## 2024-09-05 DIAGNOSIS — I10 Essential (primary) hypertension: Secondary | ICD-10-CM | POA: Diagnosis not present

## 2024-09-05 DIAGNOSIS — E890 Postprocedural hypothyroidism: Secondary | ICD-10-CM

## 2024-09-05 DIAGNOSIS — J3089 Other allergic rhinitis: Secondary | ICD-10-CM

## 2024-09-05 DIAGNOSIS — G8929 Other chronic pain: Secondary | ICD-10-CM

## 2024-09-05 DIAGNOSIS — M545 Low back pain, unspecified: Secondary | ICD-10-CM | POA: Diagnosis not present

## 2024-09-05 DIAGNOSIS — R10A3 Flank pain, bilateral: Secondary | ICD-10-CM | POA: Diagnosis not present

## 2024-09-05 DIAGNOSIS — E1142 Type 2 diabetes mellitus with diabetic polyneuropathy: Secondary | ICD-10-CM

## 2024-09-05 LAB — POCT GLYCOSYLATED HEMOGLOBIN (HGB A1C): Hemoglobin A1C: 8.2 % — AB (ref 4.0–5.6)

## 2024-09-05 MED ORDER — SILDENAFIL CITRATE 100 MG PO TABS: 50.0000 mg | ORAL_TABLET | Freq: Every day | ORAL | 5 refills | Status: AC | PRN

## 2024-09-05 MED ORDER — LISINOPRIL 40 MG PO TABS: 40.0000 mg | ORAL_TABLET | Freq: Every day | ORAL | 1 refills | Status: AC

## 2024-09-05 MED ORDER — LEVOCETIRIZINE DIHYDROCHLORIDE 5 MG PO TABS: 5.0000 mg | ORAL_TABLET | Freq: Every evening | ORAL | 1 refills | Status: AC

## 2024-09-05 MED ORDER — AZELASTINE HCL 0.05 % OP SOLN: 1.0000 [drp] | Freq: Two times a day (BID) | OPHTHALMIC | 12 refills | Status: AC

## 2024-09-05 MED ORDER — IPRATROPIUM BROMIDE 0.03 % NA SOLN: 2.0000 | Freq: Two times a day (BID) | NASAL | 12 refills | Status: AC

## 2024-09-05 NOTE — Progress Notes (Signed)
 Assessment & Plan:  Joe Wallace was seen today for hypertension.  Diagnoses and all orders for this visit:  Primary hypertension -     lisinopril  (ZESTRIL ) 40 MG tablet; Take 1 tablet (40 mg total) by mouth daily. To help protect kidneys and blood pressure Increase dose of lisinopril  for better blood pressure control GOAL <130/804   Type 2 diabetes mellitus with diabetic polyneuropathy, without long-term current use of insulin  (HCC) -     POCT glycosylated hemoglobin (Hb A1C) -     Urine Albumin/Creatinine with ratio (send out) [LAB689] -     CMP14+EGFR A1c decreased from 9.6 to 8.2 but remains above target of less than 7. Current medications at maximum dosage. - Continue current diabetes medications. - Reassess A1c in March, consider additional medication if A1c remains elevated.   Colon cancer screening -     Fecal occult blood, imunochemical  Microalbuminuria -     lisinopril  (ZESTRIL ) 40 MG tablet; Take 1 tablet (40 mg total) by mouth daily. To help protect kidneys and blood pressure  Vasculogenic erectile dysfunction, unspecified vasculogenic erectile dysfunction type -     sildenafil  (VIAGRA ) 100 MG tablet; Take 0.5-1 tablets (50-100 mg total) by mouth daily as needed for erectile dysfunction.  Environmental and seasonal allergies -     levocetirizine (XYZAL) 5 MG tablet; Take 1 tablet (5 mg total) by mouth every evening. Allergy  medicine -     ipratropium (ATROVENT ) 0.03 % nasal spray; Place 2 sprays into both nostrils every 12 (twelve) hours. -     azelastine (OPTIVAR) 0.05 % ophthalmic solution; Place 1 drop into the left eye 2 (two) times daily. Burning sensation in throat and dry cough, possibly allergy -related. No current treatment for acid reflux. - Prescribed allergy  medication including eye drops, nasal spray, and a pill.  Postoperative hypothyroidism -     Thyroid  Panel With TSH Previous thyroid  level abnormal. Difficulty in communication for follow-up. Also  nonadherence is a factor - Rechecked thyroid  levels today.   Bilateral flank pain -     Urinalysis, Complete  Chronic midline low back pain without sciatica -     DG Lumbar Spine Complete; Future Reports chronic low back pain. No current pain management at home. - Ordered x-ray of the back. - Recommended starting with Tylenol  Arthritis for pain management.   Patient has been counseled on age-appropriate routine health concerns for screening and prevention. These are reviewed and up-to-date. Referrals have been placed accordingly. Immunizations are up-to-date or declined.    Subjective:   Chief Complaint  Patient presents with   Hypertension    Joe Wallace 63 y.o. male presents to office today for DM and HTN F/U  [name] was used to communicate directly with patient for the entire encounter including providing detailed patient instructions.    He has a past medical history of Arthritis, Cancer (2017), Depression, DM2, Headache, Hyperlipidemia, Memory loss, Thyroid  neoplasm       DM 2  A1c down from 9.6. He is currently taking metformin  1000 mg twice a day.  He is also prescribed glipizide  10 mg twice daily and Jardiance  25 mg daily.  Lab Results  Component Value Date   HGBA1C 8.2 (A) 09/05/2024    HTN  Blood pressure not quite at goal. He is currently taking lisinopril  20 mg daily BP Readings from Last 3 Encounters:  09/05/24 137/80  04/30/24 (!) 163/91  01/23/24 (!) 162/87      Hypothyroidism Thyroid  level is low. We attempted  to contact him several times in September to no avail. Will repeat levels today. His levels are persistently elevated.  Lab Results  Component Value Date   TSH 0.389 (L) 06/11/2024     Allergic Rhinitis: Joe Wallace is here for evaluation of possible allergic rhinitis and conjunctivitis. Patient's symptoms include cough, itchy nose, itchy palate, nasal congestion, postnasal drip, sinus pressure, and watery eyes. These symptoms are  perennial with seasonal exacerbation. Current triggers include exposure to no known precipitant. The patient has been suffering from these symptoms for approximately several  months. The patient has tried over the counter medications with poor relief of symptoms. Immunotherapy has been used with good relief of symptoms. The patient has never had nasal polyps. The patient has no history of asthma. The patient does not suffer from frequent sinopulmonary infections. The patient has not had sinus surgery in the past. The patient has no history of eczema. He is currently experiencing excessive left eye tearing, tickle in throat causing a dry cough   Review of Systems  Constitutional:  Negative for fever, malaise/fatigue and weight loss.  HENT:  Positive for congestion. Negative for nosebleeds.   Eyes:  Negative for blurred vision, double vision, photophobia and discharge.       Watery left eye  Respiratory:  Positive for cough. Negative for shortness of breath.   Cardiovascular: Negative.  Negative for chest pain, palpitations and leg swelling.  Gastrointestinal: Negative.  Negative for heartburn, nausea and vomiting.  Genitourinary:  Positive for flank pain.  Musculoskeletal:  Positive for back pain. Negative for myalgias.  Neurological: Negative.  Negative for dizziness, focal weakness, seizures and headaches.  Endo/Heme/Allergies:  Positive for environmental allergies.  Psychiatric/Behavioral: Negative.  Negative for suicidal ideas.     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   SHOULDER ARTHROSCOPY     THYROIDECTOMY N/A 08/13/2015   Procedure: TOTAL  THYROIDECTOMY;  Surgeon: Krystal Spinner, MD;  Location: [facility];  Service: General;  Laterality: N/A;    Family History  Problem Relation Age of Onset   Diabetes Mother    Hypertension Mother    Hypertension Father    Diabetes Father    Colon cancer Neg Hx    Colon polyps Neg Hx    Esophageal cancer Neg Hx    Rectal cancer Neg Hx    Stomach cancer Neg Hx     Social History Reviewed with no changes to be made today.   Outpatient Medications Prior to Visit  Medication Sig Dispense Refill   Accu-Chek FastClix Lancets MISC Use to check blood sugar twice daily. E11.42 200 each 6   aspirin 81 MG tablet Take 81 mg by mouth daily.     atorvastatin  (LIPITOR) 40 MG tablet Take 1 tablet (40 mg total) by mouth daily. 90 tablet 3   Blood Glucose Monitoring Suppl (ACCU-CHEK GUIDE ME) w/Device KIT 1 kit by Does not apply route 2 (two) times daily. Use to check blood sugar twice daily. E11.42 1 kit 0   empagliflozin  (  JARDIANCE ) 25 MG TABS tablet Take 1 tablet (25 mg total) by mouth daily before breakfast. 90 tablet 1   escitalopram  (LEXAPRO ) 20 MG tablet Take 1.5 tablets (30 mg total) by mouth daily. 90 tablet 1   fenofibrate  (TRICOR ) 145 MG tablet Take 1 tablet (145 mg total) by mouth daily. For cholesterol 90 tablet 1   glipiZIDE  (GLUCOTROL ) 10 MG tablet Take 1 tablet (10 mg total) by mouth 2 (two) times daily before a meal. 180 tablet 1   glucose blood (ACCU-CHEK GUIDE) test strip Use as instructed. Check blood glucose level by fingerstick twice per day. E11.65 200 each 0   Insulin  Pen Needle (B-D UF III MINI PEN NEEDLES) 31G X 5 MM MISC Use as instructed. Inject into the skin once nightly. 100 each 0   levothyroxine  (SYNTHROID ) 175 MCG tablet Take 1 tablet (175 mcg total) by mouth daily before breakfast. 90 tablet 1   meloxicam  (MOBIC ) 15 MG tablet Take 1 tablet (15 mg total) by mouth daily. For joint pain. Take with food. Can interact with lexapro  and cause stomach ulcers 30 tablet 1   metFORMIN   (GLUCOPHAGE ) 1000 MG tablet Take 1 tablet (1,000 mg total) by mouth 2 (two) times daily with a meal. 180 tablet 3   lisinopril  (ZESTRIL ) 20 MG tablet Take 1 tablet (20 mg total) by mouth daily. To help protect kidneys and blood pressure 90 tablet 1   sildenafil  (VIAGRA ) 100 MG tablet Take 0.5-1 tablets (50-100 mg total) by mouth daily as needed for erectile dysfunction. 5 tablet 11   Continuous Glucose Receiver (FREESTYLE LIBRE 3 READER) DEVI Check blood glucose levels continuously E11.65 (Patient not taking: Reported on 09/05/2024) 1 each PRN   Continuous Glucose Sensor (FREESTYLE LIBRE 3 SENSOR) MISC Check blood sugars continuously E11.65 (Patient not taking: Reported on 09/05/2024) 2 each 6   lidocaine  (LIDODERM ) 5 % Place 1 patch onto the skin daily. Remove & Discard patch within 12 hours or as directed by MD (Patient not taking: Reported on 09/05/2024) 30 patch 0   Vitamin D , Ergocalciferol , (DRISDOL ) 1.25 MG (50000 UNIT) CAPS capsule Take 1 capsule (50,000 Units total) by mouth every 7 (seven) days. (Patient not taking: Reported on 09/05/2024) 12 capsule 0   oxyCODONE -acetaminophen  (PERCOCET/ROXICET) 5-325 MG tablet Take 1 tablet by mouth every 4 (four) hours as needed for severe pain (pain score 7-10). (Patient not taking: Reported on 09/05/2024) 15 tablet 0   No facility-administered medications prior to visit.    Allergies[1]     Objective:    BP 137/80 (BP Location: Left Arm, Patient Position: Sitting, Cuff Size: Normal)   Pulse 69   Ht 5' 10 (1.778 m)   Wt 220 lb (99.8 kg)   SpO2 99%   BMI 31.57 kg/m  Wt Readings from Last 3 Encounters:  09/05/24 220 lb (99.8 kg)  04/30/24 208 lb 6.4 oz (94.5 kg)  01/23/24 220 lb (99.8 kg)    Physical Exam Vitals and nursing note reviewed.  Constitutional:      Appearance: He is well-developed.  HENT:     Head: Normocephalic and atraumatic.  Cardiovascular:     Rate and Rhythm: Normal rate and regular rhythm.     Heart sounds: Normal  heart sounds. No murmur heard.    No friction rub. No gallop.  Pulmonary:     Effort: Pulmonary effort is normal. No tachypnea or respiratory distress.     Breath sounds: Normal breath sounds. No decreased breath sounds, wheezing, rhonchi or rales.  Chest:     Chest wall: No tenderness.  Abdominal:     General: Bowel sounds are normal.     Palpations: Abdomen is soft.     Tenderness: There is no right CVA tenderness or left CVA tenderness.  Musculoskeletal:        General: Normal range of motion.     Cervical back: Normal range of motion.  Skin:    General: Skin is warm and dry.  Neurological:     Mental Status: He is alert and oriented to person, place, and time.     Coordination: Coordination normal.  Psychiatric:        Behavior: Behavior normal. Behavior is cooperative.        Thought Content: Thought content normal.        Judgment: Judgment normal.          Patient has been counseled extensively about nutrition and exercise as well as the importance of adherence with medications and regular follow-up. The patient was given clear instructions to go to ER or return to medical center if symptoms don't improve, worsen or new problems develop. The patient verbalized understanding.   Follow-up: Return in about 15 weeks (around 12/19/2024).   Haze LELON Servant, FNP-BC [facility] [place], [place] [phone]   09/05/2024, 11:48 AM     [1] No Known Allergies

## 2024-09-05 NOTE — Patient Instructions (Signed)
 [facility] [address]  [phone]

## 2024-09-05 NOTE — Progress Notes (Signed)
 Left eye issue. OTC eye drop is not working.   Need medication when traveling out of the country.

## 2024-09-07 LAB — CMP14+EGFR
ALT: 18 IU/L (ref 0–44)
AST: 14 IU/L (ref 0–40)
Albumin: 4.6 g/dL (ref 3.9–4.9)
Alkaline Phosphatase: 71 IU/L (ref 47–123)
BUN/Creatinine Ratio: 16 (ref 10–24)
BUN: 17 mg/dL (ref 8–27)
Bilirubin Total: 0.4 mg/dL (ref 0.0–1.2)
CO2: 25 mmol/L (ref 20–29)
Calcium: 9.3 mg/dL (ref 8.6–10.2)
Chloride: 98 mmol/L (ref 96–106)
Creatinine, Ser: 1.06 mg/dL (ref 0.76–1.27)
Globulin, Total: 2.6 g/dL (ref 1.5–4.5)
Glucose: 187 mg/dL — ABNORMAL HIGH (ref 70–99)
Potassium: 4.5 mmol/L (ref 3.5–5.2)
Sodium: 138 mmol/L (ref 134–144)
Total Protein: 7.2 g/dL (ref 6.0–8.5)
eGFR: 78 mL/min/1.73 (ref >59)

## 2024-09-07 LAB — URINALYSIS, COMPLETE
Bilirubin, UA: NEGATIVE
Ketones, UA: NEGATIVE
Leukocytes,UA: NEGATIVE
Nitrite, UA: NEGATIVE
RBC, UA: NEGATIVE
Specific Gravity, UA: 1.021 (ref 1.005–1.030)
Urobilinogen, Ur: 0.2 mg/dL (ref 0.2–1.0)
pH, UA: 6 (ref 5.0–7.5)

## 2024-09-07 LAB — THYROID PANEL WITH TSH
Free Thyroxine Index: 2.2 (ref 1.2–4.9)
T3 Uptake Ratio: 26 % (ref 24–39)
T4, Total: 8.6 ug/dL (ref 4.5–12.0)
TSH: 3.74 u[IU]/mL (ref 0.450–4.500)

## 2024-09-07 LAB — MICROSCOPIC EXAMINATION
Bacteria, UA: NONE SEEN
Casts: NONE SEEN /LPF
Epithelial Cells (non renal): NONE SEEN /HPF (ref 0–10)
RBC, Urine: NONE SEEN /HPF (ref 0–2)
WBC, UA: NONE SEEN /HPF (ref 0–5)

## 2024-09-07 LAB — MICROALBUMIN / CREATININE URINE RATIO
Creatinine, Urine: 87.9 mg/dL
Microalb/Creat Ratio: 121 mg/g{creat} — ABNORMAL HIGH (ref 0–29)
Microalbumin, Urine: 106.4 ug/mL

## 2024-09-10 ENCOUNTER — Ambulatory Visit: Payer: Self-pay

## 2024-09-10 NOTE — Telephone Encounter (Signed)
 FYI Only or Action Required?: FYI only for provider: ED advised.  Patient was last seen in primary care on 09/05/2024 by Theotis Haze ORN, NP.  Called Nurse Triage reporting Flank Pain.  Symptoms began 3 weeks ago.  Interventions attempted: Rest, hydration, or home remedies.  Symptoms are: rapidly worsening.  Triage Disposition: Go to ED Now (Notify PCP)  Patient/caregiver understands and will follow disposition?: Yes              Copied from CRM [id number]. Topic: Clinical - Red Word Triage >> Sep 10, 2024  1:18 PM Aisha D wrote: Red Word that prompted transfer to Nurse Triage: pain   Pt stated that he is experiencing pain on his right side near his kidney. Pt stated this issue has been ongoing for about 3 weeks and he would like to schedule an appt with the provider. Reason for Disposition  [1] SEVERE pain (e.g., excruciating, scale 8-10) AND [2] present > 1 hour  Answer Assessment - Initial Assessment Questions Right flank pain 3 days  Pain on right side -- patient states where the right kidney is Patient states cannot sleep or hardly move Sharp and intense  Patient denies chest pain, difficulty breathing, nausea Sitting down and getting up is very difficult  Patient also inquired about his recent blood work He was advised that the recommendation at this time is getting his current symptoms taken care of and being evaluated further at the Emergency Room for severe pain in his right kidney area as per patient  Patient is advised to call us  back if anything changes or with any further questions/concerns. Patient was agreeable to going to the Emergency Room at this time and disconnected  Protocols used: Flank Pain-A-AH

## 2024-09-10 NOTE — Telephone Encounter (Signed)
 Called [facility] and advised of ER disposition at this time

## 2024-09-10 NOTE — Telephone Encounter (Signed)
 Noted

## 2024-09-13 ENCOUNTER — Ambulatory Visit: Payer: Self-pay | Admitting: Nurse Practitioner

## 2024-09-13 DIAGNOSIS — E890 Postprocedural hypothyroidism: Secondary | ICD-10-CM

## 2024-09-13 MED ORDER — LEVOTHYROXINE SODIUM 175 MCG PO TABS: 175.0000 ug | ORAL_TABLET | Freq: Every day | ORAL | 1 refills | Status: AC

## 2024-10-02 ENCOUNTER — Other Ambulatory Visit: Payer: Self-pay

## 2024-10-12 NOTE — Progress Notes (Incomplete)
 "    Memory Impairment of unclear etiology  Joe Wallace is a very pleasant 64 y.o. year old RH male originally from [place] with a history of hypertension, hyperlipidemia, DM2, hypothyroidism arthritis, vitamin D  deficiency history of thyroid  neoplasm, PTSD-depression followed by psychiatry, HOH seen today for evaluation of memory loss. MoCA today is /30***. Etiology is .Patient is able to participate on ADLs and to to drive without difficulties. Mood is ***. Patient is accompanied by *** who supplement the history.    MRI brain without contrast to assess for underlying structural abnormality and assess vascular load  Neurocognitive testing to further evaluate cognitive concerns and determine other underlying cause of memory changes, including potential contribution from sleep, anxiety, attention, or depression  Check B12 Continue to control mood as per PCP and psychiatry Recommend good control of cardiovascular risk factors Folllow up in  months*** Follow up pending on the above results  Discussed the use of AI scribe software for clinical note transcription with the patient, who gave verbal consent to proceed.  History of Present Illness       How long did patient have memory difficulties? For the last H years years*** .  He has been initially seen in September 2019 by Dr. Margaret at [facility] at which time he was moving from [place] to here, he was in the new family environment.  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  At this time, he***reports some difficulty remembering new information, conversations and names.  Long-term memory is good. repeats oneself?  Endorsed Disoriented when walking into a room?  Patient denies except occasionally not remembering what patient came to the room for ***  Leaving objects in unusual places? Denies.   Wandering behavior?  denies .  Any personality changes?   Denies.   Any history of depression?:  Denies   Hallucinations or paranoia?  Denies   Seizures?  Denies    Any sleep changes?   Sleeps well***does not sleep well***denies nightmares, vivid dreams, REM behavior or sleepwalking   Sleep apnea?  Denies   Any hygiene concerns?  Denies   Independent of bathing and dressing?  Endorsed  Does the patient needs help with medications? Patient is in charge *** Who is in charge of the finances? Patient is in charge   *** Any changes in appetite?  Denies ***   Patient have trouble swallowing? Denies.   Does the patient cook? No ***  Any kitchen accidents such as leaving the stove on? Denies.   Any history of headaches?   Denies.   Chronic pain ?  Chronic right shoulder pain, left sciatica Ambulates with difficulty?  Denies. *** Recent falls or head injuries? Denies. ***  Vision changes? Denies.   Any stroke like symptoms? Denies.   Any tremors?   Denies.   Any anosmia?  Denies.   Any incontinence of urine? Denies.   Any bowel dysfunction? Denies.      Patient lives with  *** History of heavy alcohol intake? Denies.   History of heavy tobacco use? Denies.   Family history of dementia? Denies.  Does patient drive? 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   SHOULDER ARTHROSCOPY     THYROIDECTOMY N/A 08/13/2015   Procedure: TOTAL THYROIDECTOMY;  Surgeon: Krystal Spinner, MD;  Location: [facility];  Service: General;   Laterality: N/A;     Allergies[1]  Current Outpatient Medications  Medication Instructions   Accu-Chek FastClix Lancets MISC Use to check blood sugar twice daily. E11.42   aspirin 81 mg, Daily   atorvastatin  (LIPITOR) 40 mg, Oral, Daily   azelastine  (OPTIVAR ) 0.05 % ophthalmic solution 1 drop, Left Eye, 2 times daily   Blood Glucose Monitoring Suppl (ACCU-CHEK GUIDE ME) w/Device KIT 1 kit, Does not apply, 2 times daily, Use to check blood sugar twice daily. E11.42   Continuous Glucose Receiver (FREESTYLE LIBRE 3 READER) DEVI Check blood glucose levels continuously E11.65   Continuous Glucose Sensor (FREESTYLE LIBRE 3 SENSOR) MISC Check blood sugars continuously E11.65   empagliflozin  (JARDIANCE ) 25 mg, Oral, Daily before breakfast   escitalopram  (LEXAPRO ) 30 mg, Oral, Daily   fenofibrate  (TRICOR ) 145 mg, Oral, Daily, For cholesterol   glipiZIDE  (GLUCOTROL ) 10 mg, Oral, 2 times daily before meals   glucose blood (ACCU-CHEK GUIDE) test strip Use as instructed. Check blood glucose level by fingerstick twice per day. E11.65   Insulin  Pen Needle (B-D UF III MINI PEN NEEDLES) 31G X 5 MM MISC Use as instructed. Inject into the skin once nightly.   ipratropium (ATROVENT ) 0.03 % nasal spray 2 sprays, Each Nare, Every 12 hours   levocetirizine (XYZAL ) 5 mg, Oral, Every evening, Allergy  medicine   levothyroxine  (SYNTHROID ) 175 mcg, Oral, Daily before breakfast   lidocaine  (LIDODERM ) 5 % 1 patch, Transdermal, Every 24 hours, Remove & Discard patch within 12 hours or as directed by MD   lisinopril  (ZESTRIL ) 40 mg, Oral, Daily, To help protect kidneys and blood pressure   meloxicam  (MOBIC ) 15 mg, Oral, Daily, For joint pain. Take with food. Can interact with lexapro  and cause stomach ulcers   metFORMIN  (GLUCOPHAGE ) 1,000 mg, Oral, 2 times daily with meals   sildenafil  (VIAGRA ) 50-100 mg, Oral, Daily PRN   Vitamin D  (Ergocalciferol ) (DRISDOL ) 50,000 Units, Oral, Every 7 days     VITALS:  There were  no vitals filed for this visit.        No data to display             06/17/2018   11:39 AM  MMSE - Mini Mental State Exam  Orientation to time 5  Orientation to Place 4  Registration 3  Attention/ Calculation 5  Recall 0  Language- name 2 objects 2  Language- repeat 1  Language- follow 3 step command 2  Language- read & follow direction 1  Write a sentence 1  Copy design 1  Total score 25     Neurological Exam    Orientation:  Alert and oriented to person, place and time. No aphasia or dysarthria. Fund of knowledge is appropriate. Recent memory impaired and remote memory intact.  Attention and concentration are normal.  Able to name objects and repeat phrases. Delayed recall  /5 Cranial nerves: There is good facial  symmetry. Extraocular muscles are intact and visual fields are full to confrontational testing. Speech is fluent and clear. No tongue deviation. Hearing is intact to conversational tone. Tone: Tone is good throughout. Abnormal movements: No tremors. No Asterixis. No Fasciculations Sensation: Sensation is intact to light touch. Vibration is intact at the bilateral big toe.  Coordination: The patient has no difficulty with RAM's or FNF bilaterally. Normal finger to nose  Motor: Strength is 5/5 in the bilateral upper and lower extremities. There is no pronator drift. There are no fasciculations noted. DTR's: Deep tendon reflexes are 2/4 bilaterally. Gait and Station: The patient is able to ambulate without difficulty The patient is able to heel toe walk. Gait is cautious and narrow. The patient is able to ambulate in a tandem fashion.       Thank you for allowing us  the opportunity to participate in the care of this nice patient. Please do not hesitate to contact us  for any questions or concerns.   Total time spent on today's visit was *** minutes dedicated to this patient today, preparing to see patient, examining the patient, ordering tests and/or medications and  counseling the patient, documenting clinical information in the EHR or other health record, independently interpreting results and communicating results to the patient/family, discussing treatment and goals, answering patient's questions and coordinating care.  Cc:  Theotis Haze ORN, NP  Camie Sevin 10/12/2024 12:23 PM      [1] No Known Allergies  "

## 2024-10-14 ENCOUNTER — Ambulatory Visit: Admitting: Physician Assistant

## 2024-10-14 ENCOUNTER — Encounter: Payer: Self-pay | Admitting: Physician Assistant

## 2024-10-14 ENCOUNTER — Ambulatory Visit

## 2024-12-19 ENCOUNTER — Ambulatory Visit: Payer: Self-pay | Admitting: Nurse Practitioner
